# Patient Record
Sex: Female | Born: 1935 | Race: White | Hispanic: No | Smoking: Never smoker
Health system: Southern US, Community
[De-identification: ages and names within clinical notes are randomized; demographics above are authoritative.]

## PROBLEM LIST (undated history)

## (undated) DIAGNOSIS — C50919 Malignant neoplasm of unspecified site of unspecified female breast: Secondary | ICD-10-CM

## (undated) DIAGNOSIS — I10 Essential (primary) hypertension: Secondary | ICD-10-CM

## (undated) DIAGNOSIS — I499 Cardiac arrhythmia, unspecified: Secondary | ICD-10-CM

## (undated) DIAGNOSIS — F419 Anxiety disorder, unspecified: Secondary | ICD-10-CM

## (undated) DIAGNOSIS — I82409 Acute embolism and thrombosis of unspecified deep veins of unspecified lower extremity: Secondary | ICD-10-CM

## (undated) DIAGNOSIS — S8012XA Contusion of left lower leg, initial encounter: Secondary | ICD-10-CM

## (undated) DIAGNOSIS — C801 Malignant (primary) neoplasm, unspecified: Secondary | ICD-10-CM

## (undated) DIAGNOSIS — I509 Heart failure, unspecified: Secondary | ICD-10-CM

## (undated) DIAGNOSIS — K219 Gastro-esophageal reflux disease without esophagitis: Secondary | ICD-10-CM

## (undated) DIAGNOSIS — M1990 Unspecified osteoarthritis, unspecified site: Secondary | ICD-10-CM

## (undated) DIAGNOSIS — I4891 Unspecified atrial fibrillation: Secondary | ICD-10-CM

## (undated) DIAGNOSIS — Z923 Personal history of irradiation: Secondary | ICD-10-CM

## (undated) DIAGNOSIS — E119 Type 2 diabetes mellitus without complications: Secondary | ICD-10-CM

## (undated) DIAGNOSIS — I2510 Atherosclerotic heart disease of native coronary artery without angina pectoris: Secondary | ICD-10-CM

## (undated) DIAGNOSIS — I639 Cerebral infarction, unspecified: Secondary | ICD-10-CM

## (undated) DIAGNOSIS — R0600 Dyspnea, unspecified: Secondary | ICD-10-CM

## (undated) DIAGNOSIS — E785 Hyperlipidemia, unspecified: Secondary | ICD-10-CM

## (undated) HISTORY — PX: BILATERAL CARPAL TUNNEL RELEASE: SHX6508

## (undated) HISTORY — PX: TONSILLECTOMY: SUR1361

## (undated) HISTORY — PX: APPENDECTOMY: SHX54

## (undated) HISTORY — PX: CHOLECYSTECTOMY: SHX55

## (undated) HISTORY — PX: CARDIAC CATHETERIZATION: SHX172

## (undated) HISTORY — PX: EYE SURGERY: SHX253

## (undated) HISTORY — PX: HERNIA REPAIR: SHX51

---

## 1997-07-03 DIAGNOSIS — Z8673 Personal history of transient ischemic attack (TIA), and cerebral infarction without residual deficits: Secondary | ICD-10-CM | POA: Insufficient documentation

## 2001-12-03 DIAGNOSIS — I82409 Acute embolism and thrombosis of unspecified deep veins of unspecified lower extremity: Secondary | ICD-10-CM

## 2001-12-03 HISTORY — DX: Acute embolism and thrombosis of unspecified deep veins of unspecified lower extremity: I82.409

## 2005-10-04 ENCOUNTER — Ambulatory Visit: Payer: Self-pay | Admitting: Internal Medicine

## 2005-10-15 ENCOUNTER — Ambulatory Visit: Payer: Self-pay | Admitting: Internal Medicine

## 2005-11-20 ENCOUNTER — Ambulatory Visit: Payer: Self-pay | Admitting: Surgery

## 2005-12-03 DIAGNOSIS — I639 Cerebral infarction, unspecified: Secondary | ICD-10-CM

## 2005-12-03 DIAGNOSIS — C50919 Malignant neoplasm of unspecified site of unspecified female breast: Secondary | ICD-10-CM

## 2005-12-03 HISTORY — DX: Malignant neoplasm of unspecified site of unspecified female breast: C50.919

## 2005-12-03 HISTORY — DX: Cerebral infarction, unspecified: I63.9

## 2005-12-07 ENCOUNTER — Ambulatory Visit: Payer: Self-pay | Admitting: Surgery

## 2005-12-27 ENCOUNTER — Ambulatory Visit: Payer: Self-pay | Admitting: Oncology

## 2006-01-03 ENCOUNTER — Ambulatory Visit: Payer: Self-pay | Admitting: Oncology

## 2006-01-31 ENCOUNTER — Ambulatory Visit: Payer: Self-pay | Admitting: Oncology

## 2006-03-03 ENCOUNTER — Ambulatory Visit: Payer: Self-pay | Admitting: Oncology

## 2006-06-03 ENCOUNTER — Ambulatory Visit: Payer: Self-pay

## 2006-06-23 ENCOUNTER — Other Ambulatory Visit: Payer: Self-pay

## 2006-06-23 ENCOUNTER — Inpatient Hospital Stay: Payer: Self-pay | Admitting: Internal Medicine

## 2006-07-01 ENCOUNTER — Ambulatory Visit: Payer: Self-pay | Admitting: Internal Medicine

## 2006-07-08 ENCOUNTER — Ambulatory Visit: Payer: Self-pay | Admitting: Internal Medicine

## 2006-07-12 ENCOUNTER — Ambulatory Visit: Payer: Self-pay | Admitting: Oncology

## 2006-12-09 ENCOUNTER — Ambulatory Visit: Payer: Self-pay | Admitting: Oncology

## 2007-12-04 HISTORY — PX: BREAST LUMPECTOMY: SHX2

## 2007-12-15 ENCOUNTER — Ambulatory Visit: Payer: Self-pay | Admitting: Internal Medicine

## 2008-07-20 ENCOUNTER — Ambulatory Visit: Payer: Self-pay | Admitting: Internal Medicine

## 2008-07-29 ENCOUNTER — Ambulatory Visit: Payer: Self-pay | Admitting: Internal Medicine

## 2009-01-10 ENCOUNTER — Ambulatory Visit: Payer: Self-pay | Admitting: Internal Medicine

## 2009-03-14 ENCOUNTER — Ambulatory Visit: Payer: Self-pay | Admitting: Internal Medicine

## 2009-04-02 ENCOUNTER — Ambulatory Visit: Payer: Self-pay | Admitting: Internal Medicine

## 2009-05-03 ENCOUNTER — Ambulatory Visit: Payer: Self-pay | Admitting: Internal Medicine

## 2009-05-06 ENCOUNTER — Ambulatory Visit: Payer: Self-pay | Admitting: Internal Medicine

## 2009-08-10 ENCOUNTER — Ambulatory Visit: Payer: Self-pay | Admitting: Internal Medicine

## 2010-01-12 ENCOUNTER — Ambulatory Visit: Payer: Self-pay | Admitting: Internal Medicine

## 2010-08-10 ENCOUNTER — Ambulatory Visit: Payer: Self-pay | Admitting: Internal Medicine

## 2010-11-07 ENCOUNTER — Ambulatory Visit: Payer: Self-pay | Admitting: Surgery

## 2011-04-04 ENCOUNTER — Ambulatory Visit: Payer: Self-pay | Admitting: Gastroenterology

## 2011-05-22 ENCOUNTER — Ambulatory Visit: Payer: Self-pay | Admitting: Internal Medicine

## 2011-08-13 ENCOUNTER — Ambulatory Visit: Payer: Self-pay | Admitting: Internal Medicine

## 2011-12-04 DIAGNOSIS — Z923 Personal history of irradiation: Secondary | ICD-10-CM

## 2011-12-04 HISTORY — PX: BREAST LUMPECTOMY: SHX2

## 2011-12-04 HISTORY — DX: Personal history of irradiation: Z92.3

## 2012-02-18 ENCOUNTER — Ambulatory Visit: Payer: Self-pay | Admitting: Internal Medicine

## 2012-08-13 ENCOUNTER — Ambulatory Visit: Payer: Self-pay | Admitting: Internal Medicine

## 2012-08-14 ENCOUNTER — Ambulatory Visit: Payer: Self-pay | Admitting: Internal Medicine

## 2012-08-21 ENCOUNTER — Ambulatory Visit: Payer: Self-pay | Admitting: Surgery

## 2012-08-21 LAB — CBC WITH DIFFERENTIAL/PLATELET
Basophil #: 0.1 10*3/uL (ref 0.0–0.1)
Basophil %: 0.9 %
Eosinophil %: 3 %
HCT: 40.5 % (ref 35.0–47.0)
HGB: 13.8 g/dL (ref 12.0–16.0)
Lymphocyte %: 17 %
MCH: 30.7 pg (ref 26.0–34.0)
Monocyte %: 10 %
Neutrophil #: 5 10*3/uL (ref 1.4–6.5)
Neutrophil %: 69.1 %
Platelet: 213 10*3/uL (ref 150–440)
RBC: 4.49 10*6/uL (ref 3.80–5.20)

## 2012-08-21 LAB — BASIC METABOLIC PANEL
Anion Gap: 8 (ref 7–16)
Calcium, Total: 8.9 mg/dL (ref 8.5–10.1)
Co2: 26 mmol/L (ref 21–32)
Creatinine: 0.79 mg/dL (ref 0.60–1.30)
EGFR (African American): >60
EGFR (Non-African Amer.): >60
Glucose: 88 mg/dL (ref 65–99)
Osmolality: 278 (ref 275–301)

## 2012-09-01 ENCOUNTER — Ambulatory Visit: Payer: Self-pay | Admitting: Surgery

## 2012-09-01 LAB — PROTIME-INR
INR: 1
Prothrombin Time: 13.6 secs (ref 11.5–14.7)

## 2012-09-08 LAB — PATHOLOGY REPORT

## 2012-09-09 ENCOUNTER — Ambulatory Visit: Payer: Self-pay | Admitting: Oncology

## 2012-09-11 ENCOUNTER — Ambulatory Visit: Payer: Self-pay | Admitting: Oncology

## 2012-10-03 ENCOUNTER — Ambulatory Visit: Payer: Self-pay | Admitting: Oncology

## 2012-10-15 LAB — CBC CANCER CENTER
Basophil %: 0.9 %
Eosinophil #: 0.1 x10 3/mm (ref 0.0–0.7)
Eosinophil %: 1.7 %
HCT: 43 % (ref 35.0–47.0)
HGB: 13.9 g/dL (ref 12.0–16.0)
Lymphocyte #: 1 x10 3/mm (ref 1.0–3.6)
Lymphocyte %: 12.3 %
Monocyte #: 0.8 x10 3/mm (ref 0.2–0.9)
Neutrophil #: 6.1 x10 3/mm (ref 1.4–6.5)
RDW: 14.5 % (ref 11.5–14.5)
WBC: 8.1 x10 3/mm (ref 3.6–11.0)

## 2012-10-22 LAB — CBC CANCER CENTER
Basophil #: 0.1 x10 3/mm (ref 0.0–0.1)
Eosinophil #: 0.1 x10 3/mm (ref 0.0–0.7)
HCT: 43.1 % (ref 35.0–47.0)
MCH: 29.7 pg (ref 26.0–34.0)
MCHC: 31.8 g/dL — ABNORMAL LOW (ref 32.0–36.0)
Monocyte #: 0.7 x10 3/mm (ref 0.2–0.9)
Neutrophil %: 76 %
Platelet: 209 x10 3/mm (ref 150–440)
RBC: 4.62 10*6/uL (ref 3.80–5.20)
RDW: 14.8 % — ABNORMAL HIGH (ref 11.5–14.5)

## 2012-10-29 LAB — CBC CANCER CENTER
Basophil #: 0.1 x10 3/mm (ref 0.0–0.1)
Eosinophil %: 1.8 %
HCT: 40.7 % (ref 35.0–47.0)
HGB: 13.5 g/dL (ref 12.0–16.0)
Lymphocyte %: 10.2 %
Monocyte %: 10.9 %
Neutrophil #: 5.7 x10 3/mm (ref 1.4–6.5)
Platelet: 203 x10 3/mm (ref 150–440)
RDW: 15 % — ABNORMAL HIGH (ref 11.5–14.5)
WBC: 7.5 x10 3/mm (ref 3.6–11.0)

## 2012-11-02 ENCOUNTER — Ambulatory Visit: Payer: Self-pay | Admitting: Oncology

## 2012-11-05 LAB — CBC CANCER CENTER
Basophil %: 0.8 %
Eosinophil #: 0.1 x10 3/mm (ref 0.0–0.7)
HCT: 41.4 % (ref 35.0–47.0)
HGB: 13.9 g/dL (ref 12.0–16.0)
Lymphocyte %: 10.3 %
MCHC: 33.6 g/dL (ref 32.0–36.0)
Neutrophil #: 6.4 x10 3/mm (ref 1.4–6.5)
Neutrophil %: 77.3 %

## 2012-11-12 LAB — CBC CANCER CENTER
Basophil #: 0 x10 3/mm (ref 0.0–0.1)
Eosinophil #: 0.2 x10 3/mm (ref 0.0–0.7)
HCT: 41 % (ref 35.0–47.0)
Lymphocyte #: 0.9 x10 3/mm — ABNORMAL LOW (ref 1.0–3.6)
Lymphocyte %: 11.3 %
MCH: 31.1 pg (ref 26.0–34.0)
MCHC: 33.9 g/dL (ref 32.0–36.0)
MCV: 92 fL (ref 80–100)
Monocyte #: 0.9 x10 3/mm (ref 0.2–0.9)
Monocyte %: 11.2 %
Neutrophil #: 6 x10 3/mm (ref 1.4–6.5)
RBC: 4.47 10*6/uL (ref 3.80–5.20)
RDW: 15 % — ABNORMAL HIGH (ref 11.5–14.5)

## 2012-12-03 ENCOUNTER — Ambulatory Visit: Payer: Self-pay | Admitting: Oncology

## 2012-12-25 LAB — CBC CANCER CENTER
Basophil %: 0.8 %
Eosinophil #: 0.1 x10 3/mm (ref 0.0–0.7)
HGB: 13.8 g/dL (ref 12.0–16.0)
Lymphocyte #: 0.9 x10 3/mm — ABNORMAL LOW (ref 1.0–3.6)
Lymphocyte %: 12.6 %
MCH: 30.8 pg (ref 26.0–34.0)
MCV: 92 fL (ref 80–100)
Neutrophil #: 5.7 x10 3/mm (ref 1.4–6.5)
Neutrophil %: 76.3 %
RBC: 4.49 10*6/uL (ref 3.80–5.20)
RDW: 14.6 % — ABNORMAL HIGH (ref 11.5–14.5)

## 2012-12-25 LAB — COMPREHENSIVE METABOLIC PANEL
Alkaline Phosphatase: 82 U/L (ref 50–136)
Bilirubin,Total: 0.4 mg/dL (ref 0.2–1.0)
Calcium, Total: 8.7 mg/dL (ref 8.5–10.1)
Co2: 29 mmol/L (ref 21–32)
Glucose: 73 mg/dL (ref 65–99)
Osmolality: 281 (ref 275–301)
SGOT(AST): 14 U/L — ABNORMAL LOW (ref 15–37)
Sodium: 141 mmol/L (ref 136–145)
Total Protein: 7 g/dL (ref 6.4–8.2)

## 2013-01-03 ENCOUNTER — Ambulatory Visit: Payer: Self-pay | Admitting: Oncology

## 2013-06-23 ENCOUNTER — Ambulatory Visit: Payer: Self-pay | Admitting: Radiation Oncology

## 2013-07-14 ENCOUNTER — Ambulatory Visit: Payer: Self-pay | Admitting: Pain Medicine

## 2013-07-17 ENCOUNTER — Ambulatory Visit: Payer: Self-pay | Admitting: Oncology

## 2013-07-23 LAB — COMPREHENSIVE METABOLIC PANEL
Albumin: 3.6 g/dL (ref 3.4–5.0)
Alkaline Phosphatase: 66 U/L (ref 50–136)
Anion Gap: 5 — ABNORMAL LOW (ref 7–16)
BUN: 18 mg/dL (ref 7–18)
Calcium, Total: 9.1 mg/dL (ref 8.5–10.1)
Chloride: 105 mmol/L (ref 98–107)
Co2: 27 mmol/L (ref 21–32)
EGFR (African American): >60
EGFR (Non-African Amer.): 55 — ABNORMAL LOW
Glucose: 153 mg/dL — ABNORMAL HIGH (ref 65–99)
Osmolality: 279 (ref 275–301)
Potassium: 4.1 mmol/L (ref 3.5–5.1)
SGOT(AST): 17 U/L (ref 15–37)
SGPT (ALT): 19 U/L (ref 12–78)

## 2013-07-23 LAB — CBC CANCER CENTER
Basophil #: 0 x10 3/mm (ref 0.0–0.1)
Basophil %: 0.8 %
Eosinophil #: 0.1 x10 3/mm (ref 0.0–0.7)
Eosinophil %: 1.6 %
HCT: 38.4 % (ref 35.0–47.0)
HGB: 13.1 g/dL (ref 12.0–16.0)
Lymphocyte %: 15.9 %
MCHC: 34.2 g/dL (ref 32.0–36.0)
Monocyte #: 0.4 x10 3/mm (ref 0.2–0.9)
Neutrophil %: 74 %
Platelet: 183 x10 3/mm (ref 150–440)
RBC: 4.16 10*6/uL (ref 3.80–5.20)

## 2013-07-27 ENCOUNTER — Ambulatory Visit: Payer: Self-pay | Admitting: Pain Medicine

## 2013-08-03 ENCOUNTER — Ambulatory Visit: Payer: Self-pay | Admitting: Oncology

## 2013-08-25 ENCOUNTER — Ambulatory Visit: Payer: Self-pay | Admitting: Pain Medicine

## 2013-09-02 ENCOUNTER — Ambulatory Visit: Payer: Self-pay | Admitting: Pain Medicine

## 2013-09-29 ENCOUNTER — Ambulatory Visit: Payer: Self-pay | Admitting: Pain Medicine

## 2013-10-20 ENCOUNTER — Other Ambulatory Visit: Payer: Self-pay | Admitting: Pain Medicine

## 2013-10-20 LAB — CBC WITH DIFFERENTIAL/PLATELET
Basophil %: 1 %
Eosinophil #: 0.2 10*3/uL (ref 0.0–0.7)
Eosinophil %: 2.5 %
HCT: 39.6 % (ref 35.0–47.0)
HGB: 13.5 g/dL (ref 12.0–16.0)
Lymphocyte %: 11.2 %
MCH: 31.8 pg (ref 26.0–34.0)
Monocyte #: 0.8 x10 3/mm (ref 0.2–0.9)
Monocyte %: 10.5 %
Neutrophil #: 5.8 10*3/uL (ref 1.4–6.5)
Neutrophil %: 74.8 %
Platelet: 251 10*3/uL (ref 150–440)
RBC: 4.24 10*6/uL (ref 3.80–5.20)
WBC: 7.8 10*3/uL (ref 3.6–11.0)

## 2013-10-20 LAB — APTT: Activated PTT: 31.6 secs (ref 23.6–35.9)

## 2013-10-21 ENCOUNTER — Ambulatory Visit: Payer: Self-pay | Admitting: Pain Medicine

## 2013-10-27 ENCOUNTER — Ambulatory Visit: Payer: Self-pay | Admitting: Oncology

## 2013-11-19 ENCOUNTER — Ambulatory Visit: Payer: Self-pay | Admitting: Pain Medicine

## 2014-01-14 ENCOUNTER — Ambulatory Visit: Payer: Self-pay | Admitting: Specialist

## 2014-01-20 ENCOUNTER — Ambulatory Visit: Payer: Self-pay | Admitting: Oncology

## 2014-01-21 LAB — COMPREHENSIVE METABOLIC PANEL
ANION GAP: 8 (ref 7–16)
AST: 13 U/L — AB (ref 15–37)
Albumin: 3.4 g/dL (ref 3.4–5.0)
Alkaline Phosphatase: 72 U/L
BUN: 11 mg/dL (ref 7–18)
Bilirubin,Total: 0.2 mg/dL (ref 0.2–1.0)
CALCIUM: 8.7 mg/dL (ref 8.5–10.1)
CREATININE: 0.93 mg/dL (ref 0.60–1.30)
Chloride: 103 mmol/L (ref 98–107)
Co2: 29 mmol/L (ref 21–32)
EGFR (African American): >60
EGFR (Non-African Amer.): 57 — ABNORMAL LOW
Glucose: 80 mg/dL (ref 65–99)
Osmolality: 278 (ref 275–301)
Potassium: 3.9 mmol/L (ref 3.5–5.1)
SGPT (ALT): 20 U/L (ref 12–78)
SODIUM: 140 mmol/L (ref 136–145)
Total Protein: 6.7 g/dL (ref 6.4–8.2)

## 2014-01-21 LAB — CBC CANCER CENTER
Basophil #: 0.1 x10 3/mm (ref 0.0–0.1)
Basophil %: 0.9 %
EOS ABS: 0.1 x10 3/mm (ref 0.0–0.7)
Eosinophil %: 1.5 %
HCT: 41.2 % (ref 35.0–47.0)
HGB: 13.2 g/dL (ref 12.0–16.0)
Lymphocyte #: 1 x10 3/mm (ref 1.0–3.6)
Lymphocyte %: 11.8 %
MCH: 29.3 pg (ref 26.0–34.0)
MCHC: 31.9 g/dL — ABNORMAL LOW (ref 32.0–36.0)
MCV: 92 fL (ref 80–100)
MONO ABS: 0.6 x10 3/mm (ref 0.2–0.9)
Monocyte %: 7.9 %
NEUTROS ABS: 6.4 x10 3/mm (ref 1.4–6.5)
Neutrophil %: 77.9 %
PLATELETS: 270 x10 3/mm (ref 150–440)
RBC: 4.49 10*6/uL (ref 3.80–5.20)
RDW: 14.9 % — ABNORMAL HIGH (ref 11.5–14.5)
WBC: 8.2 x10 3/mm (ref 3.6–11.0)

## 2014-01-22 LAB — CANCER ANTIGEN 27.29: CA 27.29: 33 U/mL (ref 0.0–38.6)

## 2014-01-31 ENCOUNTER — Ambulatory Visit: Payer: Self-pay | Admitting: Oncology

## 2014-03-16 ENCOUNTER — Ambulatory Visit: Payer: Self-pay | Admitting: Ophthalmology

## 2014-03-16 LAB — POTASSIUM: POTASSIUM: 4.3 mmol/L (ref 3.5–5.1)

## 2014-03-30 ENCOUNTER — Ambulatory Visit: Payer: Self-pay | Admitting: Ophthalmology

## 2014-05-06 DIAGNOSIS — D649 Anemia, unspecified: Secondary | ICD-10-CM | POA: Insufficient documentation

## 2014-08-23 DIAGNOSIS — Z8709 Personal history of other diseases of the respiratory system: Secondary | ICD-10-CM | POA: Insufficient documentation

## 2014-08-23 DIAGNOSIS — G459 Transient cerebral ischemic attack, unspecified: Secondary | ICD-10-CM | POA: Insufficient documentation

## 2014-08-23 DIAGNOSIS — M19041 Primary osteoarthritis, right hand: Secondary | ICD-10-CM | POA: Insufficient documentation

## 2014-08-23 DIAGNOSIS — Z87828 Personal history of other (healed) physical injury and trauma: Secondary | ICD-10-CM | POA: Insufficient documentation

## 2014-08-23 DIAGNOSIS — M19042 Primary osteoarthritis, left hand: Secondary | ICD-10-CM

## 2014-08-23 DIAGNOSIS — R001 Bradycardia, unspecified: Secondary | ICD-10-CM | POA: Insufficient documentation

## 2014-08-23 DIAGNOSIS — I2510 Atherosclerotic heart disease of native coronary artery without angina pectoris: Secondary | ICD-10-CM | POA: Insufficient documentation

## 2014-08-23 DIAGNOSIS — Z86718 Personal history of other venous thrombosis and embolism: Secondary | ICD-10-CM | POA: Insufficient documentation

## 2014-08-23 DIAGNOSIS — R002 Palpitations: Secondary | ICD-10-CM | POA: Insufficient documentation

## 2014-08-23 DIAGNOSIS — E669 Obesity, unspecified: Secondary | ICD-10-CM | POA: Insufficient documentation

## 2014-08-30 ENCOUNTER — Ambulatory Visit: Payer: Self-pay | Admitting: Oncology

## 2014-08-30 LAB — COMPREHENSIVE METABOLIC PANEL
ALBUMIN: 3.5 g/dL (ref 3.4–5.0)
AST: 13 U/L — AB (ref 15–37)
Alkaline Phosphatase: 68 U/L
Anion Gap: 6 — ABNORMAL LOW (ref 7–16)
BUN: 15 mg/dL (ref 7–18)
Bilirubin,Total: 0.4 mg/dL (ref 0.2–1.0)
CALCIUM: 9.1 mg/dL (ref 8.5–10.1)
CO2: 27 mmol/L (ref 21–32)
Chloride: 101 mmol/L (ref 98–107)
Creatinine: 1 mg/dL (ref 0.60–1.30)
EGFR (Non-African Amer.): 56 — ABNORMAL LOW
Glucose: 136 mg/dL — ABNORMAL HIGH (ref 65–99)
Osmolality: 271 (ref 275–301)
POTASSIUM: 4.4 mmol/L (ref 3.5–5.1)
SGPT (ALT): 22 U/L
SODIUM: 134 mmol/L — AB (ref 136–145)
Total Protein: 6.2 g/dL — ABNORMAL LOW (ref 6.4–8.2)

## 2014-08-30 LAB — CBC CANCER CENTER
Basophil #: 0 x10 3/mm (ref 0.0–0.1)
Basophil %: 0.6 %
EOS ABS: 0.1 x10 3/mm (ref 0.0–0.7)
Eosinophil %: 1.1 %
HCT: 41.1 % (ref 35.0–47.0)
HGB: 13.1 g/dL (ref 12.0–16.0)
Lymphocyte #: 0.9 x10 3/mm — ABNORMAL LOW (ref 1.0–3.6)
Lymphocyte %: 11.4 %
MCH: 30.3 pg (ref 26.0–34.0)
MCHC: 32 g/dL (ref 32.0–36.0)
MCV: 95 fL (ref 80–100)
MONOS PCT: 7.5 %
Monocyte #: 0.6 x10 3/mm (ref 0.2–0.9)
NEUTROS PCT: 79.4 %
Neutrophil #: 6.3 x10 3/mm (ref 1.4–6.5)
PLATELETS: 203 x10 3/mm (ref 150–440)
RBC: 4.34 10*6/uL (ref 3.80–5.20)
RDW: 15 % — AB (ref 11.5–14.5)
WBC: 7.9 x10 3/mm (ref 3.6–11.0)

## 2014-09-02 ENCOUNTER — Ambulatory Visit: Payer: Self-pay | Admitting: Oncology

## 2014-09-13 IMAGING — US US NEEDLE LOCALIZATION*R*
1 series · 7 of 7 positions shown · non-contrast
Comparison: none

REASON FOR EXAM: R brst exc mass USG NL dug 6685am  mammo after  [DATE]
COMMENTS:

[Series 1: us needle localization*right* · 0.08mm/px · 7 of 7 slices shown]
[im 1/7]
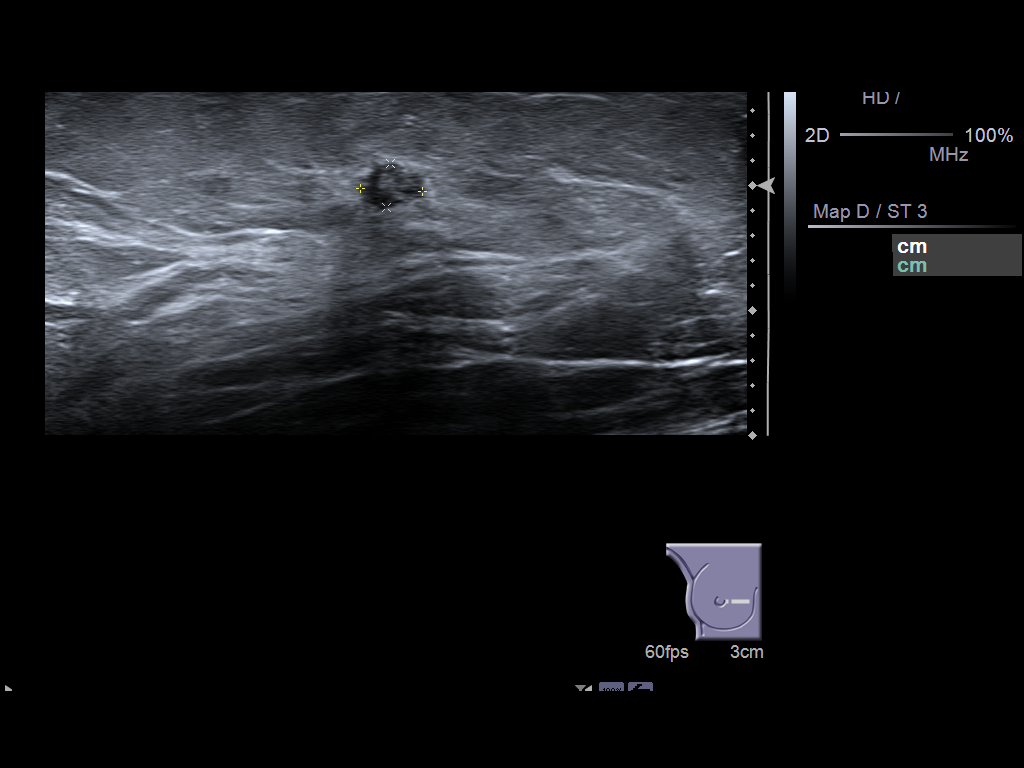
[im 2/7]
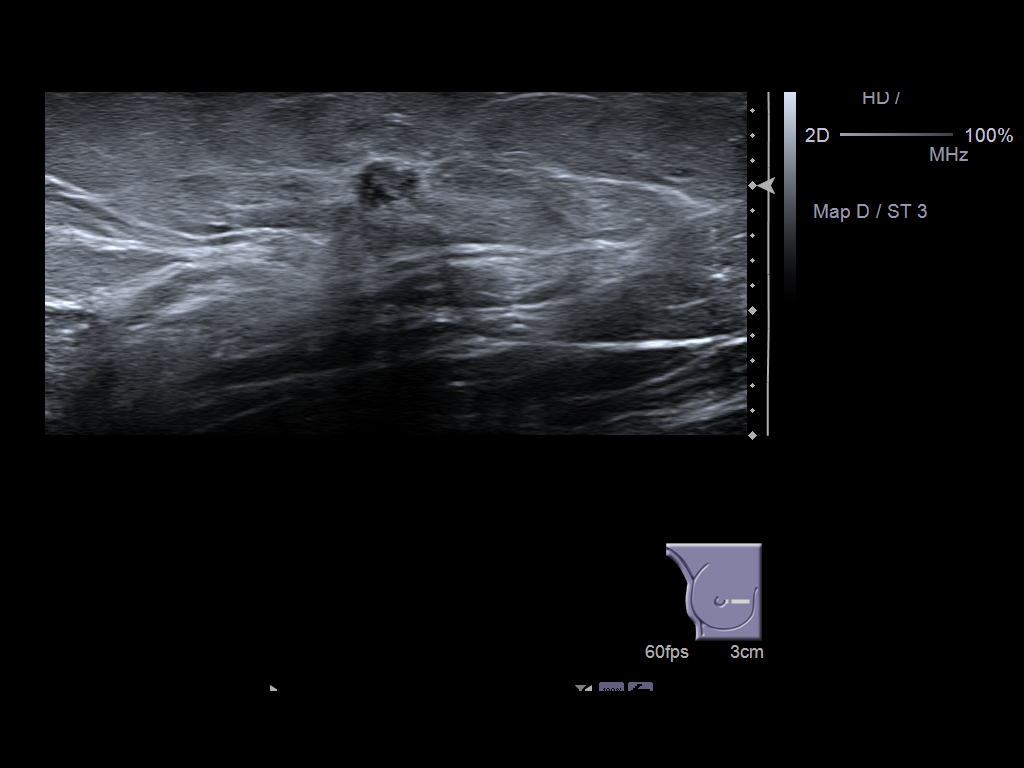
[im 3/7]
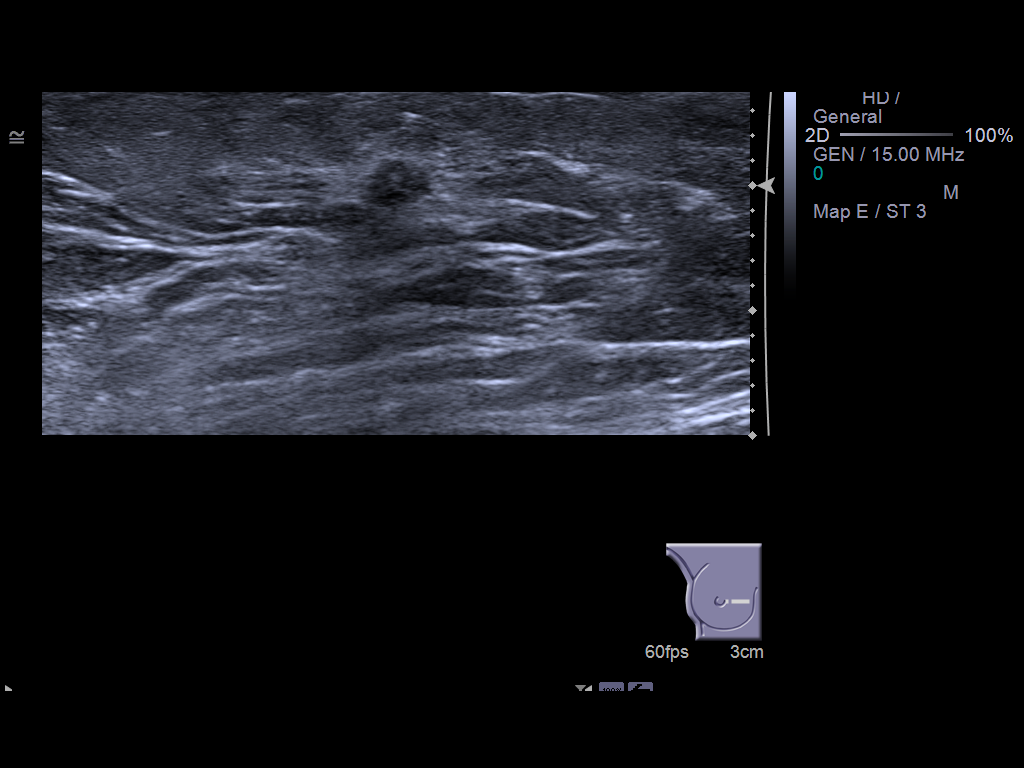
[im 4/7]
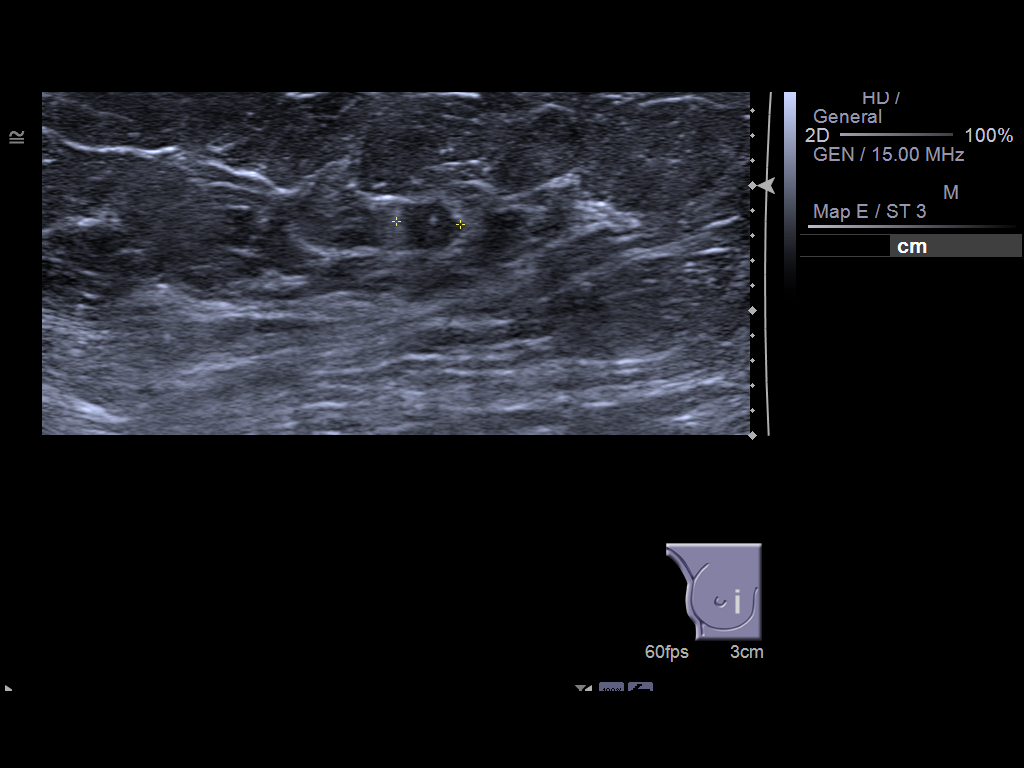
[im 5/7]
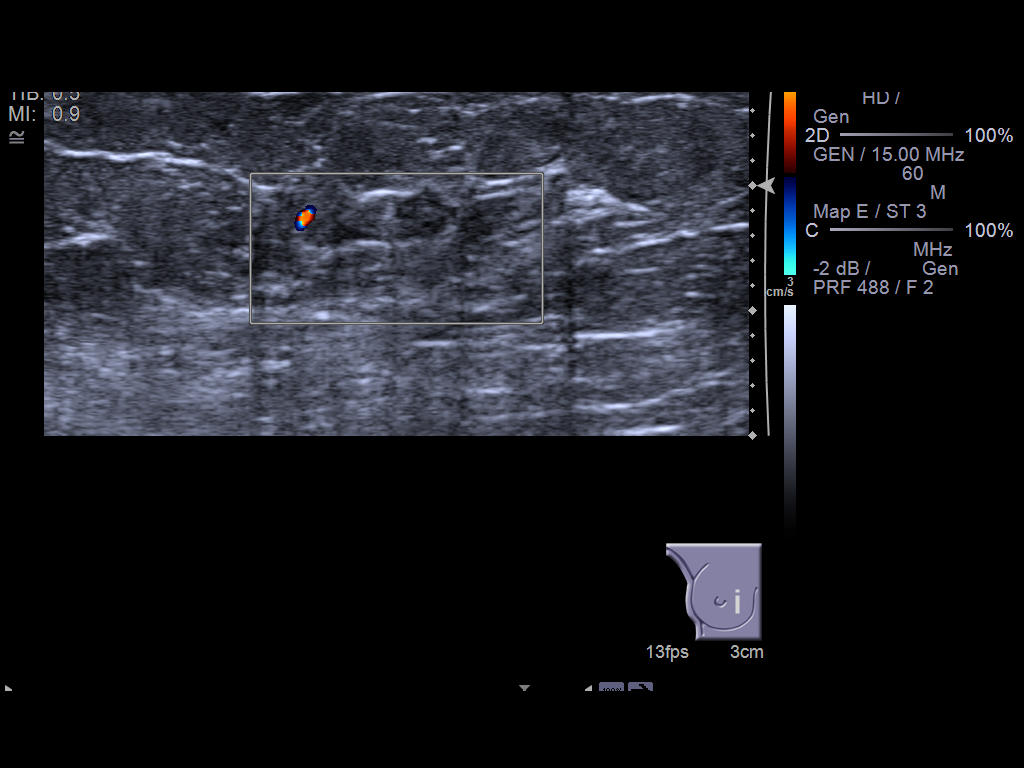
[im 6/7]
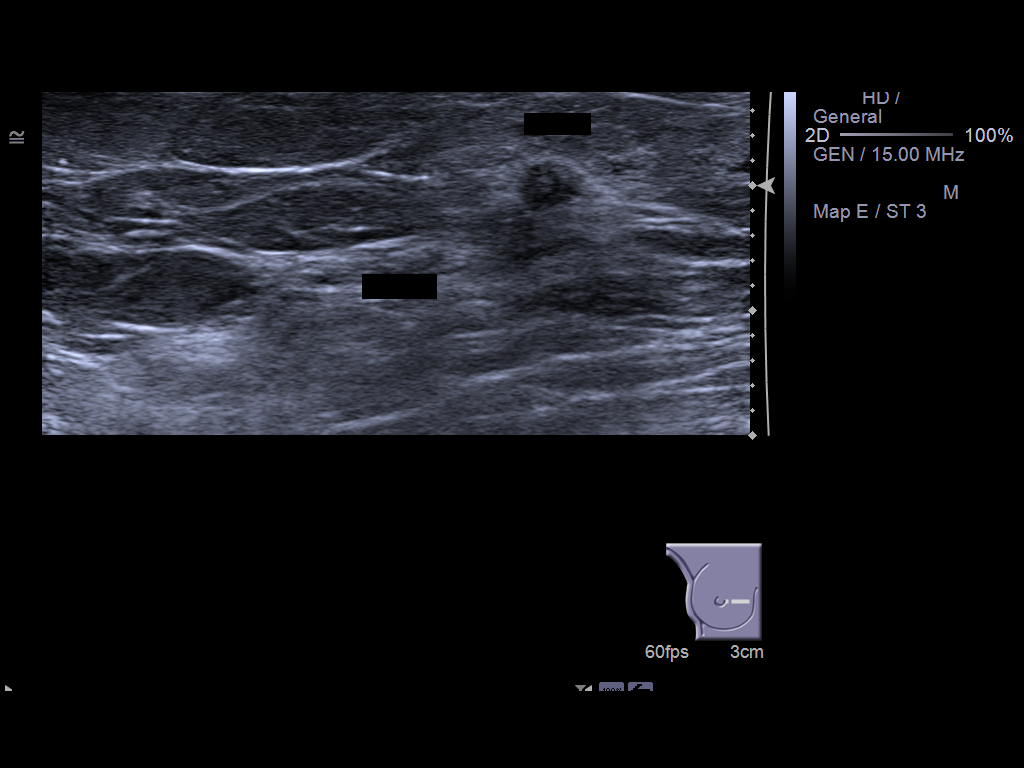
[im 7/7]
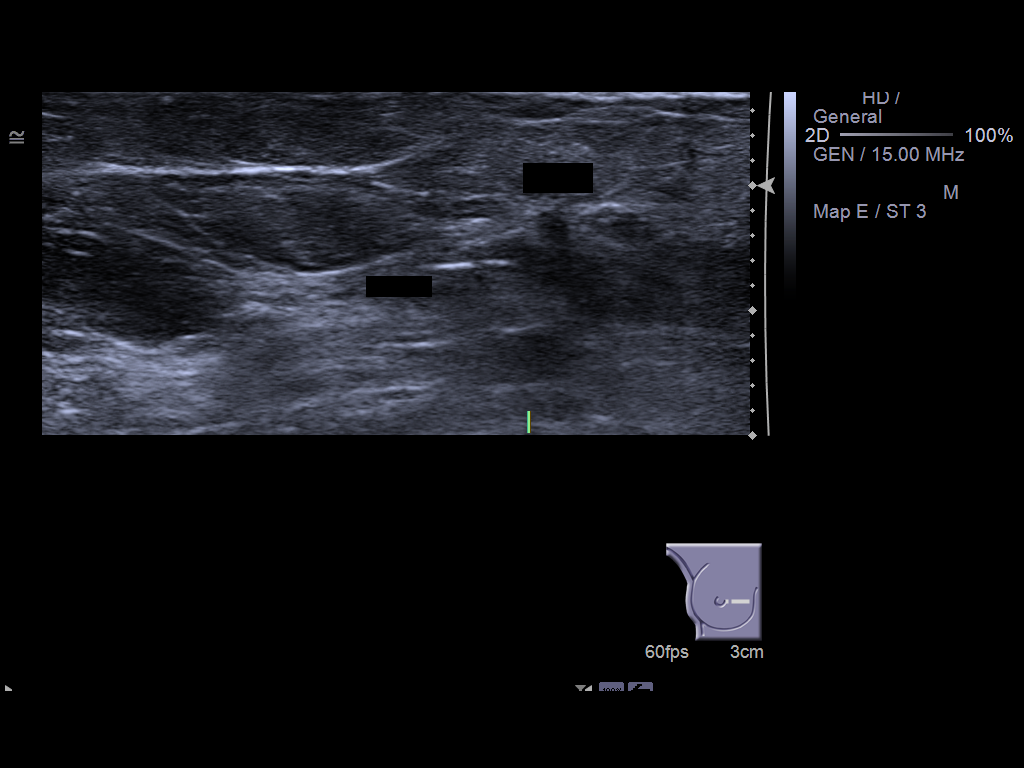

[7 of 7 positions shown; findings below may reference images not displayed]

PROCEDURE:     US  - US GUIDED NEEDLE LOCAL R BREAST  - September 01, 2012  [DATE]

RESULT:     After discussing the risk and benefits of this procedure with
the patient consent was obtained. Right breast was sterilely prepped and
draped. Following local anesthesia with 1% lidocaine Kopans hook wire needle
system was advanced into the lesion and deployed with no complications.
IMPRESSION: Successful needle localization.

## 2014-11-01 ENCOUNTER — Ambulatory Visit: Payer: Self-pay | Admitting: Oncology

## 2015-03-22 NOTE — Consult Note (Signed)
Reason for Visit: This 81 year old Female patient presents to the clinic for initial evaluation of  Breast cancer .   Referred by Dr. Tamala Julian.  Diagnosis:   Chief Complaint/Diagnosis   59-year-old female status post needle localization for a 6 mm T1 A. 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 Recently has presented with a abnormal mammogram of the right breast confirmed on ultrasound. Lesion was approxi-6 mm in size. She underwent a needle localization which was positive for 6 mm invasive mammary carcinoma strongly ER positive weakly PR positive and HER-2/neu not overexpressed. Sentinel lymph node was not performed. Wide local excision for clear margins was not performed. Patient is reluctant for any further surgery at her age and overall Gen. condition. She's been evaluated by medical oncology and is now referred to radiation oncology for opinion. She is doing fairly well although his quite elderly and confirmed.  Past Hx:    Osteoporosis:    Breast Cancer:    obesity:    Environmental allergies:    Osteoarthritis:    Hyperlipidemia:    HX: Atrial Fibrillation:    HX: DVT:    Breast cancer:    HX; Asthma:    Back Pain, Chronic:    Diabetes Mellitus, Type II (NIDD):    Hypercholesterolemia:    cva:    htn:    Tonsillectomy:    Cataract Extraction:    Cholecystectomy:    Appendectomy:    Breast surgery:   Past, Family and Social History:   Past Medical History  positive    Cardiovascular atrial fibrillation; hyperlipidemia; hypertension    Respiratory asthma    Endocrine diabetes mellitus    Neurological/Psychiatric CVA    Past Surgical History appendectomy; cholecystectomy; Tonsillectomy, cataract excision    Past Medical History Comments Chronic back pain, history of DVT    Family History noncontributory    Social History noncontributory    Additional Past Medical and Surgical History Seen accompanied by nurse navigator   Allergies:   No Known Allergies:   Home Meds:  Home Medications: Medication Instructions Status  magnesium oxide tablet 400 mg 1 tab(s) orally once a day (in the morning) Active  lovastatin tablet 40 mg 1 tab(s) orally once a day (at bedtime) Active  Cartia XT 180 mg/24 hours oral capsule, extended release 1 cap(s) orally once a day (in the morning) Active  glimepiride 4 mg oral tablet 1 tab(s) orally once a day (in the morning) Active  multivitamin 1 tab(s) orally once a day (in the morning) Active  omeprazole 20 mg oral delayed release capsule 1 cap(s) orally once a day (in the morning) Active  losartan 50 mg oral tablet 1 tab(s) orally once a day midday Active  potassium chloride 10 mEq oral tablet, extended release 1 tab(s) orally once a day midday Active  Pradaxa 150 mg oral capsule 1 cap(s) orally 2 times a day. Will stop 08/29/12. Active  alprazolam tablet 0.25 mg 1 tab(s) orally once a day (at bedtime) Active  furosemide  40 mg oral tablet 1 tab(s) orally once a day Active   Review of Systems:   General negative    Performance Status (ECOG) 1    Skin negative    Breast see HPI    Ophthalmologic negative    ENMT negative    Respiratory and Thorax negative    Cardiovascular negative    Gastrointestinal negative    Genitourinary negative    Musculoskeletal negative    Neurological negative    Psychiatric negative    Hematology/Lymphatics negative    Endocrine negative     Allergic/Immunologic negative    Review of Systems   Patient denies any weight loss, fatigue, weakness, fever, chills or night sweats. Patient denies any loss of vision, blurred vision. Patient denies any ringing  of the ears or hearing loss. No irregular heartbeat. Patient denies heart murmur or history of fainting. Patient denies any chest pain or pain radiating to her upper extremities. Patient denies any shortness of breath, difficulty breathing at night, cough or hemoptysis. Patient denies any swelling in the lower legs. Patient denies any nausea vomiting, vomiting of blood, or coffee ground material in the vomitus. Patient denies any stomach pain. Patient states has had normal bowel movements no significant constipation or diarrhea. Patient denies any dysuria, hematuria or significant nocturia. Patient denies any problems walking, swelling in the joints or loss of balance. Patient denies any skin changes, loss of hair or loss of weight. Patient denies any excessive worrying or anxiety or significant depression. Patient denies any problems with insomnia. Patient denies excessive thirst, polyuria, polydipsia. Patient denies any swollen glands, patient denies easy bruising or easy bleeding. Patient denies any recent infections, allergies or URI. Patient "s visual fields have not changed significantly in recent time.  Nursing Notes:  Nursing Vital Signs and Chemo Nursing Nursing Notes: *CC Vital Signs Flowsheet:   10-Oct-13 10:44   Temp Temperature 96.9   Pulse Pulse 97   Respirations Respirations 18   SBP SBP 168   DBP DBP 80   Pain Scale (0-10)  0   Current Weight (kg) (kg) 69.8   Height (cm) centimeters 150   BSA (m2) 1.6   Physical Exam:  General/Skin/HEENT:   General normal    Skin normal    Eyes normal    ENMT normal    Head and Neck normal    Additional PE Well-developed slightly obese female in NAD. Her left breast is status post prior surgery and adjuvant radiation therapy  with some fibrotic changes noted. Right breast wide local excision site is healing well. No dominant mass or nodularity is noted in either breast into position examined. No axillary or supraclavicular adenopathy is identified.   Breasts/Resp/CV/GI/GU:   Respiratory and Thorax normal    Cardiovascular normal    Gastrointestinal normal    Genitourinary normal   MS/Neuro/Psych/Lymph:   Musculoskeletal normal    Neurological normal    Lymphatics normal   Assessment and Plan:  Impression:   clinical stage I invasive mammary carcinoma the right breast in 81 year old female status post needle localization for ER positive PR borderline HER-2/neu negative invasive mammary carcinoma  Plan:   at this time I got over treatment options with the patient and discussed the case personally with Dr. Oliva Bustard. Based on her age and her overall desire not to have further surgery would be amenable to go ahead with accelerated partial breast irradiation based on the small clinical size of her tumor and low likelihood of any axillary  disease. Would contact Dr. Thompson Caul office for possibility of MammoSite catheter placement. Patient is okay with proceeding with that treatment plan. If MammoSite catheter could not be placed because of location of her cavity would go ahead with accelerated field of whole breast radiation. Risks and benefits of treatment were explained in detail to the patient. Depending on accelerated partial breast irradiation plan or whole breast plan side effects may change and will be explained at the time of consent signing. We'll contact Dr. Thompson Caul office. I've perspiration discussed the case with Dr. Oliva Bustard who is in agreement with her treatment plan.  I would like to take this opportunity to thank you for allowing me to continue to participate in this patient's care.  CC Referral:   cc: Dr. Tamala Julian, Dr. Fulton Reek   Electronic Signatures: Baruch Gouty, Roda Shutters (MD)  (Signed 24-Oct-13  15:01)  Authored: HPI, Diagnosis, Past Hx, PFSH, Allergies, Home Meds, ROS, Nursing Notes, Physical Exam, Encounter Assessment and Plan, CC Referring Physician   Last Updated: 24-Oct-13 15:01 by Armstead Peaks (MD)

## 2015-03-22 NOTE — Op Note (Signed)
PATIENT NAME:  Rachael Horne, Rachael Horne MR#:  [id number] DATE OF BIRTH:  09-06-1928  DATE OF PROCEDURE:  09/01/2012  PREOPERATIVE DIAGNOSIS: Right breast mass.   POSTOPERATIVE DIAGNOSIS: Right breast mass.   PROCEDURE: Excision of right breast mass.   SURGEON: Rochel Brome, M.D.   ANESTHESIA: General.   INDICATIONS: This 81 year old female with prior history of cancer of the left breast recently  had a mammogram depicting a small nodule in the medial aspect of the right breast. Ultrasound demonstrated a 6-mm shadowing, rounded nodule at the three o'clock position. She had preoperative ultrasound-guided insertion of a Kopans wire and follow-up mammogram. The nodule was best demonstrated with ultrasound.   DESCRIPTION OF THE PROCEDURE: The patient was placed on the operating table in the supine position under general anesthesia. The dressing was removed from the medial aspect of the right breast exposing the Kopans wire, which entered the breast at the peripheral medial margin of the breast. Ultrasound was used to demonstrate the location of the nodule which was at the three o'clock position and was approximately 3 cm from the entrance point of the wire. Next, a site was prepared with ChloraPrep and draped in a sterile manner.   An oblique incision was made over the wire, carried down through subcutaneous tissues and began to palpate small nodule. Dissection was carried out to remove some normal tissue with the nodule and completely excise the nodule. This was submitted with the wire for routine pathology. The wound was inspected. Several small bleeding points were cauterized. Hemostasis was subsequently intact. Subcutaneous tissues were closed with 4-0 chromic. The skin was closed with running 5-0 Monocryl subcuticular suture and Dermabond.      The patient tolerated surgery satisfactorily and was then prepared for transfer to the recovery room.     ____________________________ Lenna Sciara. Rochel Brome,  MD jws:bjt D: 09/01/2012 12:33:23 ET T: 09/01/2012 12:43:09 ET JOB#: [id number]  cc: Loreli Dollar, MD, <Dictator> Loreli Dollar MD ELECTRONICALLY SIGNED 09/07/2012 19:29

## 2015-03-26 NOTE — Op Note (Signed)
PATIENT NAME:  Rachael Horne, Rachael Horne MR#:  [id number] DATE OF BIRTH:  July 02, 1928  DATE OF PROCEDURE:  03/30/2014  PREOPERATIVE DIAGNOSIS: Visually significant cataract of the left eye.   POSTOPERATIVE DIAGNOSIS: Visually significant cataract of the left eye.   OPERATIVE PROCEDURE: Cataract extraction by phacoemulsification with implant of intraocular lens to left eye.   SURGEON: Birder Robson, MD.   ANESTHESIA:  1.  Managed anesthesia care.  2.  Topical tetracaine drops followed by 2% Xylocaine jelly applied in the preoperative holding area.   COMPLICATIONS: None.   TECHNIQUE:  Stop and chop.  DESCRIPTION OF PROCEDURE: The patient was examined and consented in the preoperative holding area where the aforementioned topical anesthesia was applied to the left eye and then brought back to the Operating Room where the left eye was prepped and draped in the usual sterile ophthalmic fashion and a lid speculum was placed. A paracentesis was created with the side port blade and the anterior chamber was filled with viscoelastic. A near clear corneal incision was performed with the steel keratome. A continuous curvilinear capsulorrhexis was performed with a cystotome followed by the capsulorrhexis forceps. Hydrodissection and hydrodelineation were carried out with BSS on a blunt cannula. The lens was removed in a stop and chop  technique and the remaining cortical material was removed with the irrigation-aspiration handpiece. The capsular bag was inflated with viscoelastic and the Tecnis ZCB00 23.5-diopter lens, serial number [id number] was placed in the capsular bag without complication. The remaining viscoelastic was removed from the eye with the irrigation-aspiration handpiece. The wounds were hydrated. The anterior chamber was flushed with Miostat and the eye was inflated to physiologic pressure. 0.1 mL of cefuroxime concentration 10 mg/mL was placed in the anterior chamber. The wounds were found to be  water tight. The eye was dressed with Vigamox. The patient was given protective glasses to wear throughout the day and a shield with which to sleep tonight. The patient was also given drops with which to begin a drop regimen today and will follow-up with me in one day.      ____________________________ Livingston Diones. Letta Cargile, MD wlp:dmm D: 03/30/2014 21:25:44 ET T: 03/30/2014 22:05:33 ET JOB#: [id number]  cc: Nayleah Gamel L. Kynzleigh Bandel, MD, <Dictator> Livingston Diones Jacayla Nordell MD ELECTRONICALLY SIGNED 03/31/2014 14:07

## 2015-07-13 ENCOUNTER — Other Ambulatory Visit: Payer: Self-pay | Admitting: Internal Medicine

## 2015-07-13 ENCOUNTER — Ambulatory Visit
Admission: RE | Admit: 2015-07-13 | Discharge: 2015-07-13 | Disposition: A | Discharge status: Home / Self Care | Payer: Medicare Other | Source: Ambulatory Visit | Attending: Internal Medicine | Admitting: Internal Medicine

## 2015-07-13 DIAGNOSIS — R600 Localized edema: Secondary | ICD-10-CM | POA: Insufficient documentation

## 2015-07-13 DIAGNOSIS — M79605 Pain in left leg: Secondary | ICD-10-CM | POA: Diagnosis not present

## 2015-08-05 ENCOUNTER — Emergency Department: Payer: Medicare Other

## 2015-08-05 ENCOUNTER — Emergency Department
Admission: EM | Admit: 2015-08-05 | Discharge: 2015-08-05 | Disposition: A | Discharge status: Home / Self Care | Payer: Medicare Other | Attending: Emergency Medicine | Admitting: Emergency Medicine

## 2015-08-05 DIAGNOSIS — I2510 Atherosclerotic heart disease of native coronary artery without angina pectoris: Secondary | ICD-10-CM | POA: Insufficient documentation

## 2015-08-05 DIAGNOSIS — Z7951 Long term (current) use of inhaled steroids: Secondary | ICD-10-CM | POA: Insufficient documentation

## 2015-08-05 DIAGNOSIS — Z79899 Other long term (current) drug therapy: Secondary | ICD-10-CM | POA: Insufficient documentation

## 2015-08-05 DIAGNOSIS — R0789 Other chest pain: Secondary | ICD-10-CM | POA: Insufficient documentation

## 2015-08-05 DIAGNOSIS — R079 Chest pain, unspecified: Secondary | ICD-10-CM | POA: Diagnosis present

## 2015-08-05 DIAGNOSIS — I10 Essential (primary) hypertension: Secondary | ICD-10-CM | POA: Diagnosis not present

## 2015-08-05 DIAGNOSIS — E119 Type 2 diabetes mellitus without complications: Secondary | ICD-10-CM | POA: Insufficient documentation

## 2015-08-05 DIAGNOSIS — I4891 Unspecified atrial fibrillation: Secondary | ICD-10-CM | POA: Insufficient documentation

## 2015-08-05 HISTORY — DX: Acute embolism and thrombosis of unspecified deep veins of unspecified lower extremity: I82.409

## 2015-08-05 HISTORY — DX: Atherosclerotic heart disease of native coronary artery without angina pectoris: I25.10

## 2015-08-05 HISTORY — DX: Malignant (primary) neoplasm, unspecified: C80.1

## 2015-08-05 HISTORY — DX: Essential (primary) hypertension: I10

## 2015-08-05 HISTORY — DX: Type 2 diabetes mellitus without complications: E11.9

## 2015-08-05 HISTORY — DX: Cerebral infarction, unspecified: I63.9

## 2015-08-05 HISTORY — DX: Hyperlipidemia, unspecified: E78.5

## 2015-08-05 LAB — BASIC METABOLIC PANEL
ANION GAP: 7 (ref 5–15)
BUN: 15 mg/dL (ref 6–20)
CALCIUM: 9.5 mg/dL (ref 8.9–10.3)
CO2: 29 mmol/L (ref 22–32)
Chloride: 102 mmol/L (ref 101–111)
Creatinine, Ser: 0.99 mg/dL (ref 0.44–1.00)
GFR, EST AFRICAN AMERICAN: 59 mL/min — AB (ref >60)
GFR, EST NON AFRICAN AMERICAN: 51 mL/min — AB (ref >60)
GLUCOSE: 111 mg/dL — AB (ref 65–99)
Potassium: 3.9 mmol/L (ref 3.5–5.1)
Sodium: 138 mmol/L (ref 135–145)

## 2015-08-05 LAB — CBC
HCT: 42.9 % (ref 35.0–47.0)
HEMOGLOBIN: 14.2 g/dL (ref 12.0–16.0)
MCH: 31.4 pg (ref 26.0–34.0)
MCHC: 33 g/dL (ref 32.0–36.0)
MCV: 95.1 fL (ref 80.0–100.0)
Platelets: 206 10*3/uL (ref 150–440)
RBC: 4.51 MIL/uL (ref 3.80–5.20)
RDW: 14.7 % — AB (ref 11.5–14.5)
WBC: 8.3 10*3/uL (ref 3.6–11.0)

## 2015-08-05 LAB — TROPONIN I

## 2015-08-05 NOTE — ED Notes (Signed)
Pt c/o chest discomfort since Monday with generalized fatigue .Marland Kitchenstates she was referred by cardiologist

## 2015-08-05 NOTE — ED Notes (Signed)
Patient has been having chest discomfort for last few days. States she called her PCP and cardiology and both were out of town. She decided to come here to be checked out.

## 2015-08-05 NOTE — Discharge Instructions (Signed)

## 2015-08-05 NOTE — ED Provider Notes (Signed)
[facility] Emergency Department Provider Note  ____________________________________________  Time seen: Approximately 7:37 PM  I have reviewed the triage vital signs and the nursing notes.   HISTORY  Chief Complaint Chest Pain and Fatigue    HPI Rachael Horne is a 81 y.o. female with multiple chronic medical conditions including chronic a-fib, CAD, and uncomplicated diabetes who presents with several days of general malaise and occasional SOB accompanied by mild chest tightness.  Nothing makes it better or worse and the symptoms are brief in duration (minutes).  She has felt similar symptoms in the past.  She tried calling her PCP and cardiologist, but both are out of town, so she came to the ED for evaluation.  She denies any recent URI symptoms, fever/chills, abdominal pain, dysuria, N/V/D.   Past Medical History  Diagnosis Date  . Diabetes mellitus without complication   . Hypertension   . Hyperlipemia   . Coronary artery disease   . Stroke   . DVT (deep venous thrombosis)   . Cancer     BL breast    There are no active problems to display for this patient.   Past Surgical History  Procedure Laterality Date  . Appendectomy    . Tonsillectomy    . Cholecystectomy    . Breast lumpectomy      removal of CA    Current Outpatient Rx  Name  Route  Sig  Dispense  Refill  . acetaminophen (TYLENOL) 500 MG tablet   Oral   Take 1,000 mg by mouth 2 (two) times daily as needed for mild pain.         Marland Kitchen albuterol (PROVENTIL HFA;VENTOLIN HFA) 108 (90 BASE) MCG/ACT inhaler   Inhalation   Inhale 2 puffs into the lungs every 6 (six) hours as needed for wheezing or shortness of breath.         . ALPRAZolam (XANAX) 0.25 MG tablet   Oral   Take 0.125 mg by mouth at bedtime as needed for anxiety or sleep.         Marland Kitchen anastrozole (ARIMIDEX) 1 MG tablet   Oral   Take 1 mg by mouth daily.         . budesonide-formoterol (SYMBICORT) 160-4.5  MCG/ACT inhaler   Inhalation   Inhale 2 puffs into the lungs 2 (two) times daily.         Marland Kitchen CALCIUM-VITAMIN D-VITAMIN K PO   Oral   Take 1 tablet by mouth 2 (two) times daily.         . dabigatran (PRADAXA) 150 MG CAPS capsule   Oral   Take 150 mg by mouth 2 (two) times daily.         Marland Kitchen diltiazem (DILACOR XR) 240 MG 24 hr capsule   Oral   Take 240 mg by mouth daily.         . diphenhydramine-acetaminophen (TYLENOL PM) 25-500 MG TABS   Oral   Take 1 tablet by mouth at bedtime as needed (for sleep).         . furosemide (LASIX) 40 MG tablet   Oral   Take 40 mg by mouth daily at 12 noon.         Marland Kitchen glimepiride (AMARYL) 4 MG tablet   Oral   Take 4 mg by mouth daily.         Marland Kitchen losartan (COZAAR) 50 MG tablet   Oral   Take 50 mg by mouth daily.         Marland Kitchen  magnesium oxide (MAG-OX) 400 MG tablet   Oral   Take 400 mg by mouth daily at 12 noon.         . Multiple Vitamin (MULTIVITAMIN WITH MINERALS) TABS tablet   Oral   Take 1 tablet by mouth daily at 12 noon.         . pantoprazole (PROTONIX) 40 MG tablet   Oral   Take 40 mg by mouth daily.         Vladimir Faster Glycol-Propyl Glycol (SYSTANE OP)   Ophthalmic   Apply 1 drop to eye 4 (four) times daily as needed (for dry eyes).         . potassium chloride (K-DUR) 10 MEQ tablet   Oral   Take 10 mEq by mouth daily at 12 noon.           Allergies Review of patient's allergies indicates no known allergies.  No family history on file.  Social History Social History  Substance Use Topics  . Smoking status: Never Smoker   . Smokeless tobacco: Never Used  . Alcohol Use: No    Review of Systems Constitutional: No fever/chills Eyes: No visual changes. ENT: No sore throat. Cardiovascular: Mild chest discomfort, currently resolved Respiratory: intermittent shortness of breath that seems to be related to her chest discomfort, worse with exertion Gastrointestinal: No abdominal pain.  No nausea, no  vomiting.  No diarrhea.  No constipation. Genitourinary: Negative for dysuria. Musculoskeletal: Negative for back pain. Skin: Negative for rash. Neurological: Negative for headaches, focal weakness or numbness.  10-point ROS otherwise negative.  ____________________________________________   PHYSICAL EXAM:  VITAL SIGNS: ED Triage Vitals  Enc Vitals Group     BP 08/05/15 1750 192/66 mmHg     Pulse Rate 08/05/15 1800 72     Resp 08/05/15 1750 20     Temp --      Temp src --      SpO2 08/05/15 1800 98 %     Weight 08/05/15 1646 150 lb (68.04 kg)     Height 08/05/15 1646 4\' 11"  (1.499 m)     Head Cir --      Peak Flow --      Pain Score 08/05/15 1646 0     Pain Loc --      Pain Edu? --      Excl. in [place]? --     Constitutional: Alert and oriented. Elderly female. Well appearing and in no acute distress. Eyes: Conjunctivae are normal. PERRL. EOMI. Head: Atraumatic. Nose: No congestion/rhinnorhea. Mouth/Throat: Mucous membranes are moist.  Oropharynx non-erythematous. Neck: No stridor.   Cardiovascular: Normal rate, regular rhythm. Grossly normal heart sounds.  Good peripheral circulation. Respiratory: Normal respiratory effort.  No retractions. Lungs CTAB. Gastrointestinal: Soft and nontender. No distention. No abdominal bruits. No CVA tenderness. Musculoskeletal: No lower extremity tenderness nor edema.  No joint effusions. Neurologic:  Normal speech and language. No gross focal neurologic deficits are appreciated.  Skin:  Skin is warm, dry and intact. No rash noted. Psychiatric: Mood and affect are normal. Speech and behavior are normal.  ____________________________________________   LABS (all labs ordered are listed, but only abnormal results are displayed)  Labs Reviewed  BASIC METABOLIC PANEL - Abnormal; Notable for the following:    Glucose, Bld 111 (*)    GFR calc non Af Amer 51 (*)    GFR calc Af Amer 59 (*)    All other components within normal limits  CBC  - Abnormal; Notable  for the following:    RDW 14.7 (*)    All other components within normal limits  TROPONIN I   ____________________________________________  EKG  ED ECG REPORT I, Domonik Levario, the attending physician, personally viewed and interpreted this ECG.   Date: 08/05/2015  EKG Time: 16:40  Rate: 79  Rhythm: atrial fibrillation, rate 79  Axis: Normal  Intervals:Abnormal due to atrial fibrillation  ST&T Change: Non-specific ST segment / T-wave changes, but no evidence of acute ischemia.   ____________________________________________  RADIOLOGY   Dg Chest 2 View  08/05/2015   CLINICAL DATA:  Shortness of breath. Weakness. Duration of symptoms: 1 week.  EXAM: CHEST  2 VIEW  COMPARISON:  01/14/2014  FINDINGS: Moderate enlargement of the cardiopericardial silhouette. Atherosclerotic aortic arch.  Bony demineralization.  The lungs appear clear.  No pleural effusion.  Mild thoracic spondylosis.  IMPRESSION: 1. Moderate cardiomegaly, without edema. 2. Bony demineralization. 3. Mild thoracic spondylosis. 4. Aortic atherosclerosis.   Electronically Signed   By: Van Clines M.D.   On: 08/05/2015 17:48    ____________________________________________   PROCEDURES  Procedure(s) performed: None  Critical Care performed: No ____________________________________________   INITIAL IMPRESSION / ASSESSMENT AND PLAN / ED COURSE  Pertinent labs & imaging results that were available during my care of the patient were reviewed by me and considered in my medical decision making (see chart for details).  I discussed the results of her workup with the patient, and she was reassured. I think that some of her concern related to the fact that both her primary care doctor and her cardiologist are currently unavailable. I explained that this workup is reassuring but offered additional workup such as a second troponin, but I also explained why did not think this was necessary given the  duration of her symptoms and the reassuring results that she has currently. She agrees and says that she is ready to go home. I believe that she will follow closely with her outpatient doctors early next week. I gave her my usual and customary return precautions. She was asymptomatic and feeling well at the time of her discharge.  ____________________________________________  FINAL CLINICAL IMPRESSION(S) / ED DIAGNOSES  Final diagnoses:  Chest discomfort      NEW MEDICATIONS STARTED DURING THIS VISIT:  New Prescriptions   No medications on file     Hinda Kehr, MD 08/06/15 0110

## 2015-08-19 ENCOUNTER — Other Ambulatory Visit: Payer: Self-pay | Admitting: *Deleted

## 2015-08-19 DIAGNOSIS — C50919 Malignant neoplasm of unspecified site of unspecified female breast: Secondary | ICD-10-CM

## 2015-08-22 ENCOUNTER — Inpatient Hospital Stay: Payer: Medicare Other

## 2015-08-22 ENCOUNTER — Encounter: Payer: Self-pay | Admitting: Oncology

## 2015-08-22 ENCOUNTER — Inpatient Hospital Stay: Payer: Medicare Other | Attending: Oncology | Admitting: Oncology

## 2015-08-22 VITALS — BP 138/62 | HR 86 | Temp 97.3°F | Wt 152.1 lb

## 2015-08-22 DIAGNOSIS — R531 Weakness: Secondary | ICD-10-CM

## 2015-08-22 DIAGNOSIS — C50911 Malignant neoplasm of unspecified site of right female breast: Secondary | ICD-10-CM | POA: Insufficient documentation

## 2015-08-22 DIAGNOSIS — C50919 Malignant neoplasm of unspecified site of unspecified female breast: Secondary | ICD-10-CM

## 2015-08-22 DIAGNOSIS — Z923 Personal history of irradiation: Secondary | ICD-10-CM | POA: Diagnosis not present

## 2015-08-22 DIAGNOSIS — Z79811 Long term (current) use of aromatase inhibitors: Secondary | ICD-10-CM | POA: Insufficient documentation

## 2015-08-22 DIAGNOSIS — I071 Rheumatic tricuspid insufficiency: Secondary | ICD-10-CM | POA: Insufficient documentation

## 2015-08-22 DIAGNOSIS — R5383 Other fatigue: Secondary | ICD-10-CM

## 2015-08-22 DIAGNOSIS — Z170 Estrogen receptor positive status [ER+]: Secondary | ICD-10-CM | POA: Diagnosis not present

## 2015-08-22 DIAGNOSIS — I10 Essential (primary) hypertension: Secondary | ICD-10-CM | POA: Diagnosis not present

## 2015-08-22 DIAGNOSIS — Z86718 Personal history of other venous thrombosis and embolism: Secondary | ICD-10-CM | POA: Diagnosis not present

## 2015-08-22 DIAGNOSIS — C50411 Malignant neoplasm of upper-outer quadrant of right female breast: Secondary | ICD-10-CM | POA: Insufficient documentation

## 2015-08-22 DIAGNOSIS — I4891 Unspecified atrial fibrillation: Secondary | ICD-10-CM | POA: Insufficient documentation

## 2015-08-22 DIAGNOSIS — E119 Type 2 diabetes mellitus without complications: Secondary | ICD-10-CM | POA: Diagnosis not present

## 2015-08-22 DIAGNOSIS — I340 Nonrheumatic mitral (valve) insufficiency: Secondary | ICD-10-CM | POA: Insufficient documentation

## 2015-08-22 DIAGNOSIS — Z8673 Personal history of transient ischemic attack (TIA), and cerebral infarction without residual deficits: Secondary | ICD-10-CM | POA: Diagnosis not present

## 2015-08-22 DIAGNOSIS — Z79899 Other long term (current) drug therapy: Secondary | ICD-10-CM | POA: Insufficient documentation

## 2015-08-22 DIAGNOSIS — I2510 Atherosclerotic heart disease of native coronary artery without angina pectoris: Secondary | ICD-10-CM | POA: Diagnosis not present

## 2015-08-22 DIAGNOSIS — Z9109 Other allergy status, other than to drugs and biological substances: Secondary | ICD-10-CM | POA: Insufficient documentation

## 2015-08-22 DIAGNOSIS — M19049 Primary osteoarthritis, unspecified hand: Secondary | ICD-10-CM | POA: Insufficient documentation

## 2015-08-22 DIAGNOSIS — E785 Hyperlipidemia, unspecified: Secondary | ICD-10-CM | POA: Diagnosis not present

## 2015-08-22 LAB — CBC WITH DIFFERENTIAL/PLATELET
BASOS ABS: 0.1 10*3/uL (ref 0–0.1)
BASOS PCT: 1 %
Eosinophils Absolute: 0.1 10*3/uL (ref 0–0.7)
Eosinophils Relative: 1 %
HEMATOCRIT: 40.9 % (ref 35.0–47.0)
HEMOGLOBIN: 13.7 g/dL (ref 12.0–16.0)
Lymphocytes Relative: 11 %
Lymphs Abs: 0.8 10*3/uL — ABNORMAL LOW (ref 1.0–3.6)
MCH: 31.3 pg (ref 26.0–34.0)
MCHC: 33.5 g/dL (ref 32.0–36.0)
MCV: 93.5 fL (ref 80.0–100.0)
Monocytes Absolute: 0.6 10*3/uL (ref 0.2–0.9)
Monocytes Relative: 7 %
NEUTROS ABS: 6.2 10*3/uL (ref 1.4–6.5)
NEUTROS PCT: 80 %
Platelets: 210 10*3/uL (ref 150–440)
RBC: 4.37 MIL/uL (ref 3.80–5.20)
RDW: 14.2 % (ref 11.5–14.5)
WBC: 7.7 10*3/uL (ref 3.6–11.0)

## 2015-08-22 LAB — COMPREHENSIVE METABOLIC PANEL
ALBUMIN: 4 g/dL (ref 3.5–5.0)
ALK PHOS: 56 U/L (ref 38–126)
ALT: 19 U/L (ref 14–54)
AST: 21 U/L (ref 15–41)
Anion gap: 7 (ref 5–15)
BILIRUBIN TOTAL: 0.6 mg/dL (ref 0.3–1.2)
BUN: 13 mg/dL (ref 6–20)
CO2: 25 mmol/L (ref 22–32)
CREATININE: 0.84 mg/dL (ref 0.44–1.00)
Calcium: 8.5 mg/dL — ABNORMAL LOW (ref 8.9–10.3)
Chloride: 101 mmol/L (ref 101–111)
GFR calc Af Amer: >60 mL/min (ref >60)
GFR calc non Af Amer: >60 mL/min (ref >60)
GLUCOSE: 138 mg/dL — AB (ref 65–99)
POTASSIUM: 4.2 mmol/L (ref 3.5–5.1)
Sodium: 133 mmol/L — ABNORMAL LOW (ref 135–145)
TOTAL PROTEIN: 6.6 g/dL (ref 6.5–8.1)

## 2015-08-22 NOTE — Progress Notes (Signed)
Patient does not have living will.  Never smoked. 

## 2015-08-22 NOTE — Progress Notes (Signed)
[place] @ [facility] [phone]  [phone] OB: 12/12/1927  MR#: [id number]  IBB#:[id number]  Patient Care Team: Idelle Crouch, MD as PCP - General (Internal Medicine)  CHIEF COMPLAINT:  Chief Complaint  Patient presents with  . OTHER     No history exists.  5. 81year-old female status post needle localization for a 6 mm T1 A. invasive mammary carcinoma of the right breast ER positive PR borderline HER-2/neu not overexpressed (status post lumpectomy, Margins were not clear) Patient has finished radiation therapy. 2. Started on anastrozole (January 2014)   INTERVAL HISTORY: 81 year old patient who recently went to emergency room with chest discomfort.  Patient was evaluated and discharged followed by cardiologist. Getting regular mammogram done.  Patient is taking anastrozole for carcinoma of breast No chills fever appetite has been stable REVIEW OF SYSTEMS:    general status: Patient is feeling weak and tired.  No change in a performance status.  No chills.  No fever. HEENT: .  No evidence of stomatitis Lungs: No cough or shortness of breath Cardiac: No chest pain or paroxysmal nocturnal dyspnea GI: No nausea no vomiting no diarrhea no abdominal pain Skin: No rash Lower extremity no swelling Neurological system: No tingling.  No numbness.  No other focal signs Musculoskeletal system no bony pains Examination of right breast.  Status post lumpectomy no evidence of recurrent disease.  Left breast free of masses.  Both axillary area within normal limit As per HPI. Otherwise, a complete review of systems is negatve.  PAST MEDICAL HISTORY: Past Medical History  Diagnosis Date  . Diabetes mellitus without complication   . Hypertension   . Hyperlipemia   . Coronary artery disease   . Stroke   . DVT (deep venous thrombosis)   . Cancer     BL breast    PAST SURGICAL HISTORY: Past Surgical History  Procedure Laterality Date  .  Appendectomy    . Tonsillectomy    . Cholecystectomy    . Breast lumpectomy      removal of CA      Osteoporosis:    Breast Cancer:    obesity:    Environmental allergies:    Osteoarthritis:    Hyperlipidemia:    HX: Atrial Fibrillation:    HX: DVT:    Breast cancer:    HX; Asthma:    Back Pain, Chronic:    Diabetes Mellitus, Type II (NIDD):    Hypercholesterolemia:    cva:    htn:    Tonsillectomy:    Cataract Extraction:    Cholecystectomy:    Appendectomy:    Breast surgery:   Preventive Screening:  Has patient had any of the following test? Mammography (1)   Last Mammography: 11/1`5   Smoking History: Smoking History Never Smoked.(1)  PFSH: Comments: No family history of colorectal cancer, breast cancer, or ovarian cancer.   Marland Kitchen  Positive for hypertension and stroke.  Social History: negative alcohol, negative tobacco  Additional Past Medical and Surgical History: As mentioned above  History of carcinoma of left breast diagnosis in 2007   ADVANCED DIRECTIVES:  No flowsheet data found.  HEALTH MAINTENANCE: Social History  Substance Use Topics  . Smoking status: Never Smoker   . Smokeless tobacco: Never Used  . Alcohol Use: No      Allergies  Allergen Reactions  . Amoxicillin Other (See Comments)    Current Outpatient Prescriptions  Medication Sig Dispense Refill  . acetaminophen (TYLENOL)  500 MG tablet Take 1,000 mg by mouth 2 (two) times daily as needed for mild pain.    Marland Kitchen albuterol (PROVENTIL HFA;VENTOLIN HFA) 108 (90 BASE) MCG/ACT inhaler Inhale 2 puffs into the lungs every 6 (six) hours as needed for wheezing or shortness of breath.    . ALPRAZolam (XANAX) 0.25 MG tablet Take 0.125 mg by mouth at bedtime as needed for anxiety or sleep.    Marland Kitchen anastrozole (ARIMIDEX) 1 MG tablet Take 1 mg by mouth daily.    . budesonide-formoterol (SYMBICORT) 160-4.5 MCG/ACT inhaler Inhale 2 puffs into the lungs 2 (two) times daily.    Marland Kitchen  CALCIUM-VITAMIN D-VITAMIN K PO Take 1 tablet by mouth 2 (two) times daily.    . dabigatran (PRADAXA) 150 MG CAPS capsule Take 150 mg by mouth 2 (two) times daily.    Marland Kitchen diltiazem (DILACOR XR) 240 MG 24 hr capsule Take 240 mg by mouth daily.    . diphenhydramine-acetaminophen (TYLENOL PM) 25-500 MG TABS Take 1 tablet by mouth at bedtime as needed (for sleep).    . furosemide (LASIX) 40 MG tablet Take 40 mg by mouth daily at 12 noon.    Marland Kitchen glimepiride (AMARYL) 4 MG tablet Take 4 mg by mouth daily.    Marland Kitchen losartan (COZAAR) 50 MG tablet Take 50 mg by mouth daily.    . magnesium oxide (MAG-OX) 400 MG tablet Take 400 mg by mouth daily at 12 noon.    . Multiple Vitamin (MULTIVITAMIN WITH MINERALS) TABS tablet Take 1 tablet by mouth daily at 12 noon.    . pantoprazole (PROTONIX) 40 MG tablet Take 40 mg by mouth daily.    Vladimir Faster Glycol-Propyl Glycol (SYSTANE OP) Apply 1 drop to eye 4 (four) times daily as needed (for dry eyes).    . potassium chloride (K-DUR) 10 MEQ tablet Take 10 mEq by mouth daily at 12 noon.     No current facility-administered medications for this visit.    OBJECTIVE:  Filed Vitals:   08/22/15 1207  BP: 138/62  Pulse: 86  Temp: 97.3 F (36.3 C)     Body mass index is 30.71 kg/(m^2).    ECOG FS:1 - Symptomatic but completely ambulatory  PHYSICAL EXAM: GENERAL:  Well developed, well nourished, sitting comfortably in the exam room in no acute distress. MENTAL STATUS:  Alert and oriented to person, place and time.  RESPIRATORY:  Clear to auscultation without rales, wheezes or rhonchi. CARDIOVASCULAR:  Regular rate and rhythm without murmur, rub or gallop. BREAST:  Right breast without masses, skin changes or nipple discharge.  Left breast without masses, skin changes or nipple discharge. ABDOMEN:  Soft, non-tender, with active bowel sounds, and no hepatosplenomegaly.  No masses. BACK:  No CVA tenderness.  No tenderness on percussion of the back or rib cage. SKIN:  No  rashes, ulcers or lesions. EXTREMITIES: No edema, no skin discoloration or tenderness.  No palpable cords. LYMPH NODES: No palpable cervical, supraclavicular, axillary or inguinal adenopathy  NEUROLOGICAL: Unremarkable. PSYCH:  Appropriate.   LAB RESULTS:  CBC Latest Ref Rng 08/22/2015 08/05/2015  WBC 3.6 - 11.0 K/uL 7.7 8.3  Hemoglobin 12.0 - 16.0 g/dL 13.7 14.2  Hematocrit 35.0 - 47.0 % 40.9 42.9  Platelets 150 - 440 K/uL 210 206    Appointment on 08/22/2015  Component Date Value Ref Range Status  . WBC 08/22/2015 7.7  3.6 - 11.0 K/uL Final  . RBC 08/22/2015 4.37  3.80 - 5.20 MIL/uL Final  . Hemoglobin 08/22/2015 13.7  12.0 - 16.0 g/dL Final  . HCT 08/22/2015 40.9  35.0 - 47.0 % Final  . MCV 08/22/2015 93.5  80.0 - 100.0 fL Final  . MCH 08/22/2015 31.3  26.0 - 34.0 pg Final  . MCHC 08/22/2015 33.5  32.0 - 36.0 g/dL Final  . RDW 08/22/2015 14.2  11.5 - 14.5 % Final  . Platelets 08/22/2015 210  150 - 440 K/uL Final  . Neutrophils Relative % 08/22/2015 80   Final  . Neutro Abs 08/22/2015 6.2  1.4 - 6.5 K/uL Final  . Lymphocytes Relative 08/22/2015 11   Final  . Lymphs Abs 08/22/2015 0.8* 1.0 - 3.6 K/uL Final  . Monocytes Relative 08/22/2015 7   Final  . Monocytes Absolute 08/22/2015 0.6  0.2 - 0.9 K/uL Final  . Eosinophils Relative 08/22/2015 1   Final  . Eosinophils Absolute 08/22/2015 0.1  0 - 0.7 K/uL Final  . Basophils Relative 08/22/2015 1   Final  . Basophils Absolute 08/22/2015 0.1  0 - 0.1 K/uL Final  . Sodium 08/22/2015 133* 135 - 145 mmol/L Final  . Potassium 08/22/2015 4.2  3.5 - 5.1 mmol/L Final  . Chloride 08/22/2015 101  101 - 111 mmol/L Final  . CO2 08/22/2015 25  22 - 32 mmol/L Final  . Glucose, Bld 08/22/2015 138* 65 - 99 mg/dL Final  . BUN 08/22/2015 13  6 - 20 mg/dL Final  . Creatinine, Ser 08/22/2015 0.84  0.44 - 1.00 mg/dL Final  . Calcium 08/22/2015 8.5* 8.9 - 10.3 mg/dL Final  . Total Protein 08/22/2015 6.6  6.5 - 8.1 g/dL Final  . Albumin 08/22/2015  4.0  3.5 - 5.0 g/dL Final  . AST 08/22/2015 21  15 - 41 U/L Final  . ALT 08/22/2015 19  14 - 54 U/L Final  . Alkaline Phosphatase 08/22/2015 56  38 - 126 U/L Final  . Total Bilirubin 08/22/2015 0.6  0.3 - 1.2 mg/dL Final  . GFR calc non Af Amer 08/22/2015 >60  >60 mL/min Final  . GFR calc Af Amer 08/22/2015 >60  >60 mL/min Final   Comment: (NOTE) The eGFR has been calculated using the CKD EPI equation. This calculation has not been validated in all clinical situations. eGFR's persistently <60 mL/min signify possible Chronic Kidney Disease.   . Anion gap 08/22/2015 7  5 - 15 Final       STUDIES: Dg Chest 2 View  08/05/2015   CLINICAL DATA:  Shortness of breath. Weakness. Duration of symptoms: 1 week.  EXAM: CHEST  2 VIEW  COMPARISON:  01/14/2014  FINDINGS: Moderate enlargement of the cardiopericardial silhouette. Atherosclerotic aortic arch.  Bony demineralization.  The lungs appear clear.  No pleural effusion.  Mild thoracic spondylosis.  IMPRESSION: 1. Moderate cardiomegaly, without edema. 2. Bony demineralization. 3. Mild thoracic spondylosis. 4. Aortic atherosclerosis.   Electronically Signed   By: Van Clines M.D.   On: 08/05/2015 17:48   Patient is due for mammogram in November which will be ordered. Reevaluation in 1 year Discussed possibility ASSESSMENT: Carcinoma of breast there is no evidence of recurrent disease  Patient is due for mammogram in November which will be ordered. Reevaluation in 1 year Discussed possibility  MEDICAL DECISION MAKING:  I discussed possibility of stopping anastrozole because of patient's old age and osteoporosis patient desires to continue for a while and consider stopping it the patient's symptoms of bony pain gets worse Continue calcium and vitamin D  Patient expressed understanding and was in agreement with this  plan. She also understands that She can call clinic at any time with any questions, concerns, or complaints.    No  matching staging information was found for the patient.  Forest Gleason, MD   08/22/2015 12:27 PM

## 2015-08-23 LAB — CANCER ANTIGEN 27.29: CA 27.29: 28.1 U/mL (ref 0.0–38.6)

## 2015-08-25 ENCOUNTER — Telehealth: Payer: Self-pay | Admitting: *Deleted

## 2015-08-25 NOTE — Telephone Encounter (Signed)
Called patient to let her know that Dr. Oliva Bustard ordered mammogram and bone density to check the strength of her bones.  Verbalized understanding.

## 2015-11-03 ENCOUNTER — Emergency Department: Payer: Medicare Other

## 2015-11-03 ENCOUNTER — Inpatient Hospital Stay
Admission: EM | Admit: 2015-11-03 | Discharge: 2015-11-08 | DRG: 871 | Disposition: A | Discharge status: Skilled Nursing Facility | Payer: Medicare Other | Attending: Internal Medicine | Admitting: Internal Medicine

## 2015-11-03 ENCOUNTER — Ambulatory Visit: Payer: Medicare Other

## 2015-11-03 ENCOUNTER — Other Ambulatory Visit: Payer: Medicare Other

## 2015-11-03 ENCOUNTER — Encounter: Payer: Self-pay | Admitting: Emergency Medicine

## 2015-11-03 DIAGNOSIS — R059 Cough, unspecified: Secondary | ICD-10-CM

## 2015-11-03 DIAGNOSIS — J441 Chronic obstructive pulmonary disease with (acute) exacerbation: Secondary | ICD-10-CM | POA: Diagnosis present

## 2015-11-03 DIAGNOSIS — Z79899 Other long term (current) drug therapy: Secondary | ICD-10-CM | POA: Diagnosis not present

## 2015-11-03 DIAGNOSIS — Y92239 Unspecified place in hospital as the place of occurrence of the external cause: Secondary | ICD-10-CM | POA: Diagnosis present

## 2015-11-03 DIAGNOSIS — J9621 Acute and chronic respiratory failure with hypoxia: Secondary | ICD-10-CM | POA: Diagnosis present

## 2015-11-03 DIAGNOSIS — N289 Disorder of kidney and ureter, unspecified: Secondary | ICD-10-CM | POA: Diagnosis present

## 2015-11-03 DIAGNOSIS — Z880 Allergy status to penicillin: Secondary | ICD-10-CM

## 2015-11-03 DIAGNOSIS — Z95810 Presence of automatic (implantable) cardiac defibrillator: Secondary | ICD-10-CM | POA: Diagnosis not present

## 2015-11-03 DIAGNOSIS — I2510 Atherosclerotic heart disease of native coronary artery without angina pectoris: Secondary | ICD-10-CM | POA: Diagnosis present

## 2015-11-03 DIAGNOSIS — G4733 Obstructive sleep apnea (adult) (pediatric): Secondary | ICD-10-CM | POA: Diagnosis present

## 2015-11-03 DIAGNOSIS — I10 Essential (primary) hypertension: Secondary | ICD-10-CM | POA: Diagnosis present

## 2015-11-03 DIAGNOSIS — Z9049 Acquired absence of other specified parts of digestive tract: Secondary | ICD-10-CM

## 2015-11-03 DIAGNOSIS — E871 Hypo-osmolality and hyponatremia: Secondary | ICD-10-CM | POA: Diagnosis not present

## 2015-11-03 DIAGNOSIS — J45909 Unspecified asthma, uncomplicated: Secondary | ICD-10-CM | POA: Diagnosis present

## 2015-11-03 DIAGNOSIS — J180 Bronchopneumonia, unspecified organism: Secondary | ICD-10-CM | POA: Diagnosis present

## 2015-11-03 DIAGNOSIS — D72829 Elevated white blood cell count, unspecified: Secondary | ICD-10-CM | POA: Diagnosis not present

## 2015-11-03 DIAGNOSIS — W19XXXA Unspecified fall, initial encounter: Secondary | ICD-10-CM | POA: Diagnosis present

## 2015-11-03 DIAGNOSIS — J440 Chronic obstructive pulmonary disease with acute lower respiratory infection: Secondary | ICD-10-CM | POA: Diagnosis present

## 2015-11-03 DIAGNOSIS — I5033 Acute on chronic diastolic (congestive) heart failure: Secondary | ICD-10-CM | POA: Diagnosis present

## 2015-11-03 DIAGNOSIS — J189 Pneumonia, unspecified organism: Secondary | ICD-10-CM

## 2015-11-03 DIAGNOSIS — E119 Type 2 diabetes mellitus without complications: Secondary | ICD-10-CM | POA: Diagnosis present

## 2015-11-03 DIAGNOSIS — R05 Cough: Secondary | ICD-10-CM | POA: Diagnosis not present

## 2015-11-03 DIAGNOSIS — D72828 Other elevated white blood cell count: Secondary | ICD-10-CM

## 2015-11-03 DIAGNOSIS — A419 Sepsis, unspecified organism: Principal | ICD-10-CM

## 2015-11-03 DIAGNOSIS — Z853 Personal history of malignant neoplasm of breast: Secondary | ICD-10-CM | POA: Diagnosis not present

## 2015-11-03 DIAGNOSIS — Z8673 Personal history of transient ischemic attack (TIA), and cerebral infarction without residual deficits: Secondary | ICD-10-CM

## 2015-11-03 DIAGNOSIS — I739 Peripheral vascular disease, unspecified: Secondary | ICD-10-CM | POA: Diagnosis present

## 2015-11-03 DIAGNOSIS — R509 Fever, unspecified: Secondary | ICD-10-CM

## 2015-11-03 DIAGNOSIS — I4891 Unspecified atrial fibrillation: Secondary | ICD-10-CM | POA: Diagnosis present

## 2015-11-03 DIAGNOSIS — Z86718 Personal history of other venous thrombosis and embolism: Secondary | ICD-10-CM

## 2015-11-03 DIAGNOSIS — I081 Rheumatic disorders of both mitral and tricuspid valves: Secondary | ICD-10-CM | POA: Diagnosis present

## 2015-11-03 DIAGNOSIS — T380X5A Adverse effect of glucocorticoids and synthetic analogues, initial encounter: Secondary | ICD-10-CM | POA: Diagnosis present

## 2015-11-03 DIAGNOSIS — D729 Disorder of white blood cells, unspecified: Secondary | ICD-10-CM

## 2015-11-03 DIAGNOSIS — J209 Acute bronchitis, unspecified: Secondary | ICD-10-CM

## 2015-11-03 LAB — URINALYSIS COMPLETE WITH MICROSCOPIC (ARMC ONLY)
BACTERIA UA: NONE SEEN
Bilirubin Urine: NEGATIVE
Glucose, UA: NEGATIVE mg/dL
Hgb urine dipstick: NEGATIVE
LEUKOCYTES UA: NEGATIVE
Nitrite: NEGATIVE
PROTEIN: 30 mg/dL — AB
SPECIFIC GRAVITY, URINE: 1.013 (ref 1.005–1.030)
pH: 7 (ref 5.0–8.0)

## 2015-11-03 LAB — COMPREHENSIVE METABOLIC PANEL
ALK PHOS: 59 U/L (ref 38–126)
ALT: 15 U/L (ref 14–54)
AST: 18 U/L (ref 15–41)
Albumin: 3.9 g/dL (ref 3.5–5.0)
Anion gap: 8 (ref 5–15)
BUN: 12 mg/dL (ref 6–20)
CALCIUM: 9.1 mg/dL (ref 8.9–10.3)
CHLORIDE: 101 mmol/L (ref 101–111)
CO2: 24 mmol/L (ref 22–32)
CREATININE: 0.76 mg/dL (ref 0.44–1.00)
GFR calc Af Amer: >60 mL/min (ref >60)
GFR calc non Af Amer: >60 mL/min (ref >60)
Glucose, Bld: 108 mg/dL — ABNORMAL HIGH (ref 65–99)
Potassium: 4.2 mmol/L (ref 3.5–5.1)
Sodium: 133 mmol/L — ABNORMAL LOW (ref 135–145)
Total Bilirubin: 0.4 mg/dL (ref 0.3–1.2)
Total Protein: 6.8 g/dL (ref 6.5–8.1)

## 2015-11-03 LAB — GLUCOSE, CAPILLARY
GLUCOSE-CAPILLARY: 172 mg/dL — AB (ref 65–99)
Glucose-Capillary: 114 mg/dL — ABNORMAL HIGH (ref 65–99)

## 2015-11-03 LAB — CBC WITH DIFFERENTIAL/PLATELET
BASOS PCT: 1 %
Basophils Absolute: 0.1 10*3/uL (ref 0–0.1)
EOS ABS: 0 10*3/uL (ref 0–0.7)
EOS PCT: 0 %
HCT: 40.2 % (ref 35.0–47.0)
Hemoglobin: 13.3 g/dL (ref 12.0–16.0)
LYMPHS ABS: 0.4 10*3/uL — AB (ref 1.0–3.6)
Lymphocytes Relative: 4 %
MCH: 31.2 pg (ref 26.0–34.0)
MCHC: 33 g/dL (ref 32.0–36.0)
MCV: 94.5 fL (ref 80.0–100.0)
MONO ABS: 0.7 10*3/uL (ref 0.2–0.9)
MONOS PCT: 8 %
Neutro Abs: 8.2 10*3/uL — ABNORMAL HIGH (ref 1.4–6.5)
Neutrophils Relative %: 87 %
PLATELETS: 197 10*3/uL (ref 150–440)
RBC: 4.26 MIL/uL (ref 3.80–5.20)
RDW: 14.4 % (ref 11.5–14.5)
WBC: 9.3 10*3/uL (ref 3.6–11.0)

## 2015-11-03 LAB — TROPONIN I

## 2015-11-03 LAB — LACTIC ACID, PLASMA
LACTIC ACID, VENOUS: 0.8 mmol/L (ref 0.5–2.0)
Lactic Acid, Venous: 1 mmol/L (ref 0.5–2.0)

## 2015-11-03 MED ORDER — PANTOPRAZOLE SODIUM 40 MG PO TBEC
40.0000 mg | DELAYED_RELEASE_TABLET | Freq: Every day | ORAL | Status: DC
  Administered 2015-11-04 – 2015-11-07 (×4): 40 mg via ORAL
  Filled 2015-11-03 (×4): qty 1

## 2015-11-03 MED ORDER — INSULIN ASPART 100 UNIT/ML ~~LOC~~ SOLN
0.0000 [IU] | Freq: Three times a day (TID) | SUBCUTANEOUS | Status: DC
  Administered 2015-11-04 (×2): 2 [IU] via SUBCUTANEOUS
  Administered 2015-11-05: 3 [IU] via SUBCUTANEOUS
  Administered 2015-11-05: 5 [IU] via SUBCUTANEOUS
  Administered 2015-11-06: 2 [IU] via SUBCUTANEOUS
  Administered 2015-11-06: 3 [IU] via SUBCUTANEOUS
  Administered 2015-11-07: 1 [IU] via SUBCUTANEOUS
  Administered 2015-11-07: 2 [IU] via SUBCUTANEOUS
  Filled 2015-11-03: qty 1
  Filled 2015-11-03 (×2): qty 2
  Filled 2015-11-03: qty 1
  Filled 2015-11-03: qty 2
  Filled 2015-11-03: qty 9
  Filled 2015-11-03: qty 3

## 2015-11-03 MED ORDER — ONDANSETRON HCL 4 MG/2ML IJ SOLN: 4.0000 mg | Freq: Four times a day (QID) | INTRAMUSCULAR | Status: DC | PRN

## 2015-11-03 MED ORDER — MAGNESIUM OXIDE 400 (241.3 MG) MG PO TABS
400.0000 mg | ORAL_TABLET | Freq: Every day | ORAL | Status: DC
  Administered 2015-11-04 – 2015-11-07 (×4): 400 mg via ORAL
  Filled 2015-11-03 (×4): qty 1

## 2015-11-03 MED ORDER — ACETAMINOPHEN 500 MG PO TABS
1000.0000 mg | ORAL_TABLET | Freq: Two times a day (BID) | ORAL | Status: DC | PRN
  Administered 2015-11-04: 1000 mg via ORAL
  Filled 2015-11-03: qty 2

## 2015-11-03 MED ORDER — FUROSEMIDE 10 MG/ML IJ SOLN
40.0000 mg | Freq: Two times a day (BID) | INTRAMUSCULAR | Status: DC
  Administered 2015-11-03 – 2015-11-04 (×2): 40 mg via INTRAVENOUS
  Filled 2015-11-03 (×2): qty 4

## 2015-11-03 MED ORDER — HYDROCOD POLST-CPM POLST ER 10-8 MG/5ML PO SUER
5.0000 mL | Freq: Two times a day (BID) | ORAL | Status: DC
  Administered 2015-11-03 – 2015-11-07 (×8): 5 mL via ORAL
  Filled 2015-11-03 (×8): qty 5

## 2015-11-03 MED ORDER — NITROGLYCERIN 2 % TD OINT
1.0000 [in_us] | TOPICAL_OINTMENT | Freq: Four times a day (QID) | TRANSDERMAL | Status: DC
  Administered 2015-11-03 – 2015-11-04 (×3): 1 [in_us] via TOPICAL
  Filled 2015-11-03 (×3): qty 1

## 2015-11-03 MED ORDER — AZITHROMYCIN 500 MG IV SOLR
500.0000 mg | Freq: Once | INTRAVENOUS | Status: AC
  Administered 2015-11-03: 500 mg via INTRAVENOUS
  Filled 2015-11-03: qty 500

## 2015-11-03 MED ORDER — DIPHENHYDRAMINE-APAP (SLEEP) 25-500 MG PO TABS: 1.0000 | ORAL_TABLET | Freq: Every evening | ORAL | Status: DC | PRN

## 2015-11-03 MED ORDER — SODIUM CHLORIDE 0.9 % IJ SOLN
3.0000 mL | Freq: Two times a day (BID) | INTRAMUSCULAR | Status: DC
  Administered 2015-11-03 – 2015-11-06 (×8): 3 mL via INTRAVENOUS

## 2015-11-03 MED ORDER — METHYLPREDNISOLONE SODIUM SUCC 125 MG IJ SOLR
60.0000 mg | INTRAMUSCULAR | Status: DC
  Administered 2015-11-03 – 2015-11-06 (×4): 60 mg via INTRAVENOUS
  Filled 2015-11-03 (×4): qty 2

## 2015-11-03 MED ORDER — DEXTROSE 5 % IV SOLN
500.0000 mg | INTRAVENOUS | Status: DC
  Administered 2015-11-04 – 2015-11-06 (×3): 500 mg via INTRAVENOUS
  Filled 2015-11-03 (×4): qty 500

## 2015-11-03 MED ORDER — ALBUTEROL SULFATE (2.5 MG/3ML) 0.083% IN NEBU
2.5000 mg | INHALATION_SOLUTION | Freq: Four times a day (QID) | RESPIRATORY_TRACT | Status: DC | PRN
  Administered 2015-11-03 – 2015-11-04 (×3): 2.5 mg via RESPIRATORY_TRACT
  Filled 2015-11-03 (×3): qty 3

## 2015-11-03 MED ORDER — ACETAMINOPHEN 650 MG RE SUPP: 650.0000 mg | Freq: Four times a day (QID) | RECTAL | Status: DC | PRN

## 2015-11-03 MED ORDER — GLIMEPIRIDE 4 MG PO TABS
4.0000 mg | ORAL_TABLET | Freq: Every day | ORAL | Status: DC
Start: 2015-11-03 — End: 2015-11-08
  Administered 2015-11-03 – 2015-11-07 (×5): 4 mg via ORAL
  Filled 2015-11-03 (×7): qty 1

## 2015-11-03 MED ORDER — DABIGATRAN ETEXILATE MESYLATE 75 MG PO CAPS
150.0000 mg | ORAL_CAPSULE | Freq: Two times a day (BID) | ORAL | Status: DC
  Administered 2015-11-03 – 2015-11-07 (×9): 150 mg via ORAL
  Filled 2015-11-03 (×9): qty 2

## 2015-11-03 MED ORDER — INSULIN ASPART 100 UNIT/ML ~~LOC~~ SOLN
0.0000 [IU] | Freq: Every day | SUBCUTANEOUS | Status: DC
  Administered 2015-11-04 – 2015-11-06 (×2): 2 [IU] via SUBCUTANEOUS
  Filled 2015-11-03 (×2): qty 2
  Filled 2015-11-03: qty 4
  Filled 2015-11-03: qty 2

## 2015-11-03 MED ORDER — LOSARTAN POTASSIUM 50 MG PO TABS
50.0000 mg | ORAL_TABLET | Freq: Every day | ORAL | Status: DC
  Administered 2015-11-03 – 2015-11-07 (×5): 50 mg via ORAL
  Filled 2015-11-03 (×5): qty 1

## 2015-11-03 MED ORDER — INSULIN ASPART 100 UNIT/ML ~~LOC~~ SOLN
4.0000 [IU] | Freq: Three times a day (TID) | SUBCUTANEOUS | Status: DC
  Administered 2015-11-04 – 2015-11-07 (×10): 4 [IU] via SUBCUTANEOUS
  Filled 2015-11-03 (×8): qty 4

## 2015-11-03 MED ORDER — LUBRIFRESH P.M. OP OINT
1.0000 "application " | TOPICAL_OINTMENT | Freq: Four times a day (QID) | OPHTHALMIC | Status: DC | PRN
  Filled 2015-11-03: qty 1

## 2015-11-03 MED ORDER — ACETAMINOPHEN 325 MG PO TABS
ORAL_TABLET | ORAL | Status: AC
  Administered 2015-11-03: 325 mg
  Filled 2015-11-03: qty 2

## 2015-11-03 MED ORDER — ANASTROZOLE 1 MG PO TABS
1.0000 mg | ORAL_TABLET | Freq: Every day | ORAL | Status: DC
  Administered 2015-11-03 – 2015-11-07 (×5): 1 mg via ORAL
  Filled 2015-11-03 (×5): qty 1

## 2015-11-03 MED ORDER — POTASSIUM CHLORIDE CRYS ER 20 MEQ PO TBCR
20.0000 meq | EXTENDED_RELEASE_TABLET | Freq: Every day | ORAL | Status: DC
  Administered 2015-11-03 – 2015-11-07 (×5): 20 meq via ORAL
  Filled 2015-11-03 (×5): qty 1

## 2015-11-03 MED ORDER — DEXTROSE 5 % IV SOLN
1.0000 g | INTRAVENOUS | Status: DC
  Administered 2015-11-04 – 2015-11-07 (×4): 1 g via INTRAVENOUS
  Filled 2015-11-03 (×4): qty 10

## 2015-11-03 MED ORDER — DILTIAZEM HCL 25 MG/5ML IV SOLN
15.0000 mg | Freq: Once | INTRAVENOUS | Status: AC
  Administered 2015-11-03: 15 mg via INTRAVENOUS
  Filled 2015-11-03: qty 5

## 2015-11-03 MED ORDER — DILTIAZEM HCL ER 240 MG PO CP24
240.0000 mg | ORAL_CAPSULE | Freq: Every day | ORAL | Status: DC
  Administered 2015-11-03 – 2015-11-07 (×5): 240 mg via ORAL
  Filled 2015-11-03 (×10): qty 1

## 2015-11-03 MED ORDER — BUDESONIDE-FORMOTEROL FUMARATE 160-4.5 MCG/ACT IN AERO
2.0000 | INHALATION_SPRAY | Freq: Two times a day (BID) | RESPIRATORY_TRACT | Status: DC
  Administered 2015-11-03 – 2015-11-07 (×8): 2 via RESPIRATORY_TRACT
  Filled 2015-11-03: qty 6

## 2015-11-03 MED ORDER — ZOLPIDEM TARTRATE 5 MG PO TABS
5.0000 mg | ORAL_TABLET | Freq: Every evening | ORAL | Status: DC | PRN
  Administered 2015-11-05 (×2): 5 mg via ORAL
  Filled 2015-11-03 (×3): qty 1

## 2015-11-03 MED ORDER — SODIUM CHLORIDE 0.9 % IV BOLUS (SEPSIS)
1000.0000 mL | INTRAVENOUS | Status: AC
  Administered 2015-11-03 (×2): 1000 mL via INTRAVENOUS

## 2015-11-03 MED ORDER — DEXTROSE 5 % IV SOLN
1.0000 g | Freq: Once | INTRAVENOUS | Status: AC
Start: 2015-11-03 — End: 2015-11-03
  Administered 2015-11-03: 1 g via INTRAVENOUS
  Filled 2015-11-03: qty 10

## 2015-11-03 MED ORDER — SODIUM CHLORIDE 0.9 % IV BOLUS (SEPSIS): 500.0000 mL | INTRAVENOUS | Status: DC

## 2015-11-03 MED ORDER — ALPRAZOLAM 0.25 MG PO TABS
0.1250 mg | ORAL_TABLET | Freq: Every evening | ORAL | Status: DC | PRN
  Administered 2015-11-03: 0.125 mg via ORAL
  Filled 2015-11-03: qty 1

## 2015-11-03 MED ORDER — ONDANSETRON HCL 4 MG PO TABS: 4.0000 mg | ORAL_TABLET | Freq: Four times a day (QID) | ORAL | Status: DC | PRN

## 2015-11-03 MED ORDER — ACETAMINOPHEN 325 MG PO TABS
650.0000 mg | ORAL_TABLET | Freq: Four times a day (QID) | ORAL | Status: DC | PRN
  Administered 2015-11-03 – 2015-11-06 (×2): 650 mg via ORAL
  Filled 2015-11-03 (×2): qty 2

## 2015-11-03 NOTE — ED Notes (Signed)
Pt here via EMS with c/o cough, sob, increasing fever, weakness, nausea and htn. States Dr Doy Hutching is treating her cough with Zpack currently. Pt has a hx of afib, showing afib on the monitor, BG per ems 108. Pt denies pain at this time. Productive cough with thick yellow sputum noted. Pt appears in no distress at this time. Sats 90% on RA.

## 2015-11-03 NOTE — Consult Note (Signed)
ANTIBIOTIC CONSULT NOTE - INITIAL  Pharmacy Consult for azithromycin/ceftriaxone Indication: sepsis due to acute bronchitis  Allergies  Allergen Reactions  . Amoxicillin Other (See Comments)    Patient Measurements: Height: 4\' 11"  (149.9 cm) Weight: 148 lb (67.132 kg) IBW/kg (Calculated) : 43.2 Adjusted Body Weight:   Vital Signs: Temp: 99.7 F (37.6 C) (12/01 1103) Temp Source: Oral (12/01 1103) BP: 168/58 mmHg (12/01 1230) Pulse Rate: 82 (12/01 1230) Intake/Output from previous day:   Intake/Output from this shift:    Labs:  Recent Labs  11/03/15 0911  WBC 9.3  HGB 13.3  PLT 197  CREATININE 0.76   Estimated Creatinine Clearance: 42.9 mL/min (by C-G formula based on Cr of 0.76). No results for input(s): VANCOTROUGH, VANCOPEAK, VANCORANDOM, GENTTROUGH, GENTPEAK, GENTRANDOM, TOBRATROUGH, TOBRAPEAK, TOBRARND, AMIKACINPEAK, AMIKACINTROU, AMIKACIN in the last 72 hours.   Microbiology: No results found for this or any previous visit (from the past 720 hour(s)).  Medical History: Past Medical History  Diagnosis Date  . Diabetes mellitus without complication ([place])   . Hypertension   . Hyperlipemia   . Coronary artery disease   . Stroke ([place])   . DVT (deep venous thrombosis) ([place])   . Cancer ([place])     BL breast    Medications:  Scheduled:  . furosemide  40 mg Intravenous Q12H  . insulin aspart  0-5 Units Subcutaneous QHS  . insulin aspart  0-9 Units Subcutaneous TID WC  . insulin aspart  4 Units Subcutaneous TID WC  . methylPREDNISolone (SOLU-MEDROL) injection  60 mg Intravenous Q24H  . nitroGLYCERIN  1 inch Topical 4 times per day  . potassium chloride  20 mEq Oral Daily   Assessment: Pt is a 81 year old female with a hx of cough with yellow phlegm x 1.5 weeks. In ER pt is febrile, sob, hypoxic, tachypneic, in afib and hypertensive. Pt started taking a zpak from her PCP 2 days ago on the 29th. Chest x-ray neg for PNA.   Goal of Therapy:  resolution of  infection  Plan:  Follow up culture results Will continue azithromycin 500mg  daily and ceftriaxone 1g daily. pharmacy to continue to monitor. thank you for the consult  Ramond Dial 11/03/2015,12:48 PM

## 2015-11-03 NOTE — ED Provider Notes (Signed)
[facility] Emergency Department Provider Note    ____________________________________________  Time seen: 0920  I have reviewed the triage vital signs and the nursing notes.   HISTORY  Chief Complaint Cough; Fever; and Hypertension   History limited by: Not Limited   HPI Rachael Horne is a 81 y.o. female who presents to the emergency department today because of concerns for continued cough and weakness. Patient states that she started coughing 1 week ago. She was put on a Z-Pak by her primary care doctor and states she has had 2 days of this. She states she is not feeling any better. Her cough is productive of yellowish phlegm. She has had some central chest discomfort. She feels like she has had some fever the past day. She denies history of pneumonia in the past. States she does have history of asthma.   Past Medical History  Diagnosis Date  . Diabetes mellitus without complication ([place])   . Hypertension   . Hyperlipemia   . Coronary artery disease   . Stroke ([place])   . DVT (deep venous thrombosis) ([place])   . Cancer [facility])     BL breast    Patient Active Problem List   Diagnosis Date Noted  . A-fib ([place]) 08/22/2015  . Breast CA ([place]) 08/22/2015  . Diabetes mellitus, type 2 ([place]) 08/22/2015  . Allergy to environmental factors 08/22/2015  . History of knee problem 08/22/2015  . HLD (hyperlipidemia) 08/22/2015  . BP (high blood pressure) 08/22/2015  . MI (mitral incompetence) 08/22/2015  . Arthritis of hand, degenerative 08/22/2015  . TI (tricuspid incompetence) 08/22/2015  . Bradycardia 08/23/2014  . Arteriosclerosis of coronary artery 08/23/2014  . History of asthma 08/23/2014  . H/O deep venous thrombosis 08/23/2014  . Arthritis, degenerative 08/23/2014  . Adiposity 08/23/2014  . Temporary cerebral vascular dysfunction 08/23/2014  . Awareness of heartbeats 08/23/2014  . Absolute anemia 05/06/2014  . Cerebrovascular accident, old  07/03/1997    Past Surgical History  Procedure Laterality Date  . Appendectomy    . Tonsillectomy    . Cholecystectomy    . Breast lumpectomy      removal of CA    Current Outpatient Rx  Name  Route  Sig  Dispense  Refill  . acetaminophen (TYLENOL) 500 MG tablet   Oral   Take 1,000 mg by mouth 2 (two) times daily as needed for mild pain.         Marland Kitchen albuterol (PROVENTIL HFA;VENTOLIN HFA) 108 (90 BASE) MCG/ACT inhaler   Inhalation   Inhale 2 puffs into the lungs every 6 (six) hours as needed for wheezing or shortness of breath.         . ALPRAZolam (XANAX) 0.25 MG tablet   Oral   Take 0.125 mg by mouth at bedtime as needed for anxiety or sleep.         Marland Kitchen anastrozole (ARIMIDEX) 1 MG tablet   Oral   Take 1 mg by mouth daily.         . budesonide-formoterol (SYMBICORT) 160-4.5 MCG/ACT inhaler   Inhalation   Inhale 2 puffs into the lungs 2 (two) times daily.         Marland Kitchen CALCIUM-VITAMIN D-VITAMIN K PO   Oral   Take 1 tablet by mouth 2 (two) times daily.         . dabigatran (PRADAXA) 150 MG CAPS capsule   Oral   Take 150 mg by mouth 2 (two) times daily.         Marland Kitchen  diltiazem (DILACOR XR) 240 MG 24 hr capsule   Oral   Take 240 mg by mouth daily.         . diphenhydramine-acetaminophen (TYLENOL PM) 25-500 MG TABS   Oral   Take 1 tablet by mouth at bedtime as needed (for sleep).         . furosemide (LASIX) 40 MG tablet   Oral   Take 40 mg by mouth daily at 12 noon.         Marland Kitchen glimepiride (AMARYL) 4 MG tablet   Oral   Take 4 mg by mouth daily.         Marland Kitchen losartan (COZAAR) 50 MG tablet   Oral   Take 50 mg by mouth daily.         . magnesium oxide (MAG-OX) 400 MG tablet   Oral   Take 400 mg by mouth daily at 12 noon.         . Multiple Vitamin (MULTIVITAMIN WITH MINERALS) TABS tablet   Oral   Take 1 tablet by mouth daily at 12 noon.         . pantoprazole (PROTONIX) 40 MG tablet   Oral   Take 40 mg by mouth daily.         Vladimir Faster Glycol-Propyl Glycol (SYSTANE OP)   Ophthalmic   Apply 1 drop to eye 4 (four) times daily as needed (for dry eyes).         . potassium chloride (K-DUR) 10 MEQ tablet   Oral   Take 10 mEq by mouth daily at 12 noon.           Allergies Amoxicillin  No family history on file.  Social History Social History  Substance Use Topics  . Smoking status: Never Smoker   . Smokeless tobacco: Never Used  . Alcohol Use: No    Review of Systems  Constitutional: Negative for fever. Cardiovascular: Negative for chest pain. Respiratory: Positive for cough Gastrointestinal: Negative for abdominal pain, vomiting and diarrhea. Genitourinary: Negative for dysuria. Musculoskeletal: Negative for back pain. Skin: Negative for rash. Neurological: Negative for headaches, focal weakness or numbness. 10-point ROS otherwise negative.  ____________________________________________   PHYSICAL EXAM:  VITAL SIGNS: ED Triage Vitals  Enc Vitals Group     BP --      Pulse Rate 11/03/15 0859 160     Resp 11/03/15 0859 26     Temp --      Temp src --      SpO2 11/03/15 0859 90 %     Weight 11/03/15 0859 148 lb (67.132 kg)     Height 11/03/15 0859 4\' 11"  (1.499 m)   Constitutional: Alert and oriented. Well appearing and in no distress. Occasional dry cough Eyes: Conjunctivae are normal. PERRL. Normal extraocular movements. ENT   Head: Normocephalic and atraumatic.   Nose: No congestion/rhinnorhea.   Mouth/Throat: Mucous membranes are moist.   Neck: No stridor. Hematological/Lymphatic/Immunilogical: No cervical lymphadenopathy. Cardiovascular: Tachycardic, irregularly irregular rhythm.  No murmurs, rubs, or gallops. Respiratory: Normal respiratory effort without tachypnea nor retractions. Breath sounds are clear and equal bilaterally. No wheezes/rales/rhonchi. Gastrointestinal: Soft and nontender. No distention. There is no CVA tenderness. Genitourinary:  Deferred Musculoskeletal: Normal range of motion in all extremities. No joint effusions.  1+ bilateral lower extremity edema Neurologic:  Normal speech and language. No gross focal neurologic deficits are appreciated.  Skin:  Skin is warm, dry and intact. No rash noted. Psychiatric: Mood and affect are normal.  Speech and behavior are normal. Patient exhibits appropriate insight and judgment.  ____________________________________________    LABS (pertinent positives/negatives)  Labs Reviewed  COMPREHENSIVE METABOLIC PANEL - Abnormal; Notable for the following:    Sodium 133 (*)    Glucose, Bld 108 (*)    All other components within normal limits  CBC WITH DIFFERENTIAL/PLATELET - Abnormal; Notable for the following:    Neutro Abs 8.2 (*)    Lymphs Abs 0.4 (*)    All other components within normal limits  URINALYSIS COMPLETEWITH MICROSCOPIC ([facility] ONLY) - Abnormal; Notable for the following:    Color, Urine YELLOW (*)    APPearance CLEAR (*)    Ketones, ur TRACE (*)    Protein, ur 30 (*)    Squamous Epithelial / LPF 0-5 (*)    All other components within normal limits  CULTURE, BLOOD (ROUTINE X 2)  CULTURE, BLOOD (ROUTINE X 2)  URINE CULTURE  CULTURE, EXPECTORATED SPUTUM-ASSESSMENT  LACTIC ACID, PLASMA  TROPONIN I  LACTIC ACID, PLASMA  HEMOGLOBIN A1C     ____________________________________________   EKG  None  ____________________________________________    RADIOLOGY  CXR IMPRESSION: No active disease.   ____________________________________________   PROCEDURES  Procedure(s) performed: None  Critical Care performed: No  ____________________________________________   INITIAL IMPRESSION / ASSESSMENT AND PLAN / ED COURSE  Pertinent labs & imaging results that were available during my care of the patient were reviewed by me and considered in my medical decision making (see chart for details).  Patient presented to the emergency department today  because of concerns for continued cough and shortness of breath. Patient's initial vital signs concerning for infection given febrile, tachycardia and tachypnea. Patient was written for broad-spectrum IV fluids and antibiotics. Blood work came back with no leukocytosis or elevated lactic acid.  ____________________________________________   FINAL CLINICAL IMPRESSION(S) / ED DIAGNOSES  Final diagnoses:  Fever, unspecified fever cause  Atrial fibrillation with RVR ([place])  Cough     Nance Pear, MD 11/04/15 1341

## 2015-11-03 NOTE — H&P (Addendum)
[facility] [place] NAME: Rachael Horne    MR#:  [id number]  DATE OF BIRTH:  [birth year]  DATE OF ADMISSION:  11/03/2015  PRIMARY CARE PHYSICIAN: SPARKS,JEFFREY D, MD   REQUESTING/REFERRING PHYSICIAN:   CHIEF COMPLAINT:   Chief Complaint  Patient presents with  . Cough  . Fever  . Hypertension    HISTORY OF PRESENT ILLNESS: Rachael Horne  is a 81 y.o. female with a known history of coronary artery disease, hypertension, status post ICD placement, stroke, peripheral vascular disease, congestive heart failure, obstructive sleep apnea who presents to the hospital with complaints of shortness of breath and cough worsening over a period of one week. Patient is coughing up yellow-looking phlegm. In emergency room, she was noted to be febrile to 102, she was also noted to be hypoxic with O2 sats of 88% on room air and tachypneic. She was in atrial fibrillation at rate of 150 and severely hypertensive with systolic blood pressure around 170.  Chest x-ray however did not show any pneumonia. Labs revealed neutrophilia, hyponatremia. Exam showed bilateral rales and wheezes as well as lower extremity swelling, hospitalist services were contacted for admission. Patient admits of tightness in the chest intermittently as well as nausea and feeling presyncopal earlier today.      PAST MEDICAL HISTORY:   Past Medical History  Diagnosis Date  . Diabetes mellitus without complication ([place])   . Hypertension   . Hyperlipemia   . Coronary artery disease   . Stroke ([place])   . DVT (deep venous thrombosis) ([place])   . Cancer ([place])     BL breast    PAST SURGICAL HISTORY:  Past Surgical History  Procedure Laterality Date  . Appendectomy    . Tonsillectomy    . Cholecystectomy    . Breast lumpectomy      removal of CA    SOCIAL HISTORY:  Social History  Substance Use Topics  . Smoking status: Never Smoker   . Smokeless tobacco: Never Used  .  Alcohol Use: No    FAMILY HISTORY: No family history on file.  DRUG ALLERGIES:  Allergies  Allergen Reactions  . Amoxicillin Other (See Comments)    Review of Systems  Constitutional: Positive for fever, chills and malaise/fatigue. Negative for weight loss.  HENT: Negative for congestion.   Eyes: Negative for blurred vision and double vision.  Respiratory: Positive for cough, sputum production, shortness of breath and wheezing.   Cardiovascular: Positive for chest pain, leg swelling and PND. Negative for palpitations and orthopnea.  Gastrointestinal: Positive for nausea and vomiting. Negative for abdominal pain, diarrhea, constipation, blood in stool and melena.  Genitourinary: Negative for dysuria, urgency, frequency and hematuria.  Musculoskeletal: Negative for falls.  Skin: Negative for rash.  Neurological: Positive for weakness. Negative for dizziness.  Psychiatric/Behavioral: Negative for depression and memory loss. The patient is not nervous/anxious.     MEDICATIONS AT HOME:  Prior to Admission medications   Medication Sig Start Date End Date Taking? Authorizing Provider  acetaminophen (TYLENOL) 500 MG tablet Take 1,000 mg by mouth 2 (two) times daily as needed for mild pain.   Yes Historical Provider, MD  albuterol (PROVENTIL HFA;VENTOLIN HFA) 108 (90 BASE) MCG/ACT inhaler Inhale 2 puffs into the lungs every 6 (six) hours as needed for wheezing or shortness of breath.   Yes Historical Provider, MD  ALPRAZolam Duanne Moron) 0.25 MG tablet Take 0.125 mg by mouth at bedtime as needed for anxiety or  sleep.   Yes Historical Provider, MD  anastrozole (ARIMIDEX) 1 MG tablet Take 1 mg by mouth daily.   Yes Historical Provider, MD  budesonide-formoterol (SYMBICORT) 160-4.5 MCG/ACT inhaler Inhale 2 puffs into the lungs 2 (two) times daily.   Yes Historical Provider, MD  CALCIUM-VITAMIN D-VITAMIN K PO Take 1 tablet by mouth 2 (two) times daily.   Yes Historical Provider, MD  dabigatran  (PRADAXA) 150 MG CAPS capsule Take 150 mg by mouth 2 (two) times daily.   Yes Historical Provider, MD  diltiazem (DILACOR XR) 240 MG 24 hr capsule Take 240 mg by mouth daily.   Yes Historical Provider, MD  diphenhydramine-acetaminophen (TYLENOL PM) 25-500 MG TABS Take 1 tablet by mouth at bedtime as needed (for sleep).   Yes Historical Provider, MD  furosemide (LASIX) 40 MG tablet Take 40 mg by mouth daily at 12 noon.   Yes Historical Provider, MD  glimepiride (AMARYL) 4 MG tablet Take 4 mg by mouth daily.   Yes Historical Provider, MD  losartan (COZAAR) 50 MG tablet Take 50 mg by mouth daily.   Yes Historical Provider, MD  magnesium oxide (MAG-OX) 400 MG tablet Take 400 mg by mouth daily at 12 noon.   Yes Historical Provider, MD  Multiple Vitamin (MULTIVITAMIN WITH MINERALS) TABS tablet Take 1 tablet by mouth daily at 12 noon.   Yes Historical Provider, MD  pantoprazole (PROTONIX) 40 MG tablet Take 40 mg by mouth daily.   Yes Historical Provider, MD  Polyethyl Glycol-Propyl Glycol (SYSTANE OP) Apply 1 drop to eye 4 (four) times daily as needed (for dry eyes).   Yes Historical Provider, MD  potassium chloride (K-DUR) 10 MEQ tablet Take 10 mEq by mouth daily at 12 noon.   Yes Historical Provider, MD      PHYSICAL EXAMINATION:   VITAL SIGNS: Blood pressure 171/71, pulse 91, temperature 99.7 F (37.6 C), temperature source Oral, resp. rate 16, height 4\' 11"  (1.499 m), weight 67.132 kg (148 lb), SpO2 93 %.  GENERAL:  81 y.o.-year-old patient sitting on the edge of the bed in moderate respiratory distress.  EYES: Pupils equal, round, reactive to light and accommodation. No scleral icterus. Extraocular muscles intact.  HEENT: Head atraumatic, normocephalic. Oropharynx and nasopharynx clear.  NECK:  Supple, no jugular venous distention. No thyroid enlargement, no tenderness.  LUNGS: Some diminished breath sounds bilaterally, bilateral wheezing, rales,rhonchi and crepitations. Intermittently using  accessory muscles of respiration, especially on exertion, trying to sit up upright due to shortness of breath.  CARDIOVASCULAR: S1, S2 , tachycardic, distant, difficult to hear due to adventitial sounds in the lungs. Not able to appreciate murmurs, rubs, or gallops.  ABDOMEN: Soft, nontender, nondistended. Bowel sounds present. No organomegaly or mass.  EXTREMITIES: 2+ bilateral lower extremity and pedal edema, no cyanosis, or clubbing.  NEUROLOGIC: Cranial nerves II through XII are intact. Muscle strength 5/5 in all extremities. Sensation intact. Gait not checked.  PSYCHIATRIC: The patient is alert and oriented x 3.  SKIN: No obvious rash, lesion, or ulcer.   LABORATORY PANEL:   CBC  Recent Labs Lab 11/03/15 0911  WBC 9.3  HGB 13.3  HCT 40.2  PLT 197  MCV 94.5  MCH 31.2  MCHC 33.0  RDW 14.4  LYMPHSABS 0.4*  MONOABS 0.7  EOSABS 0.0  BASOSABS 0.1   ------------------------------------------------------------------------------------------------------------------  Chemistries   Recent Labs Lab 11/03/15 0911  NA 133*  K 4.2  CL 101  CO2 24  GLUCOSE 108*  BUN 12  CREATININE 0.76  CALCIUM 9.1  AST 18  ALT 15  ALKPHOS 59  BILITOT 0.4   ------------------------------------------------------------------------------------------------------------------  Cardiac Enzymes  Recent Labs Lab 11/03/15 0911  TROPONINI <0.03   ------------------------------------------------------------------------------------------------------------------  RADIOLOGY: Dg Chest Port 1 View  11/03/2015  CLINICAL DATA:  Cough and fever EXAM: PORTABLE CHEST 1 VIEW COMPARISON:  08/05/2015 FINDINGS: Cardiac enlargement. Negative for heart failure. Negative for infiltrate or effusion. IMPRESSION: No active disease. Electronically Signed   By: Franchot Gallo M.D.   On: 11/03/2015 10:15    EKG: Orders placed or performed during the hospital encounter of 08/05/15  . EKG 12-Lead  . EKG 12-Lead  .  ED EKG within 10 minutes  . ED EKG within 10 minutes  . EKG    IMPRESSION AND PLAN:  Active Problems:   Sepsis ([place])   Acute bronchitis   Neutrophilia   Fever   Malignant essential hypertension   Atrial fibrillation with RVR (H[facility])   Hyponatremia 1. Sepsis due to acute bronchitis. Admit patient to medical floor initiated her on Rocephin as well as Zithromax pending sputum cultures, blood cultures 2. Acute bronchitis. No sputum cultures. Continue antibiotics. Adjust antibiotics depending on culture results 3. Neutrophilia. Follow with therapy 4. COPD exacerbation. Initiate patient on steroids. Continue nebulizer therapy 5. Acute on chronic respiratory failure with hypoxia. Continue oxygen therapy per nasal cannula, rales, keeping pulse oximeter is around 92-94% 6, , A. fib, RVR, resume outpatient medications advance them as needed . Continue Pradaxa 7. Malignant essential hypertension with fluid overload , continue outpatient medications. Add Lasix intravenously, nitroglycerin topically , follow ins and outs as well as weight  8. Acute and chronic diastolic CHF, continue diuretics intravenously as well as nitroglycerin topically as well as Cozaar    All the records are reviewed and case discussed with ED provider. Management plans discussed with the patient, family and they are in agreement.  CODE STATUS: Advance Directive Documentation        Most Recent Value   Type of Advance Directive  Living will, Healthcare Power of Attorney   Pre-existing out of facility DNR order (yellow form or pink MOST form)     "MOST" Form in Place?         TOTAL  CRITICAL CARETIME TAKING CARE OF THIS PATIENT: 60 minutes .   , coordination of care time 10 to 15 minutes in discussion with patient's son and power of attorney for medical care  Ruston Fedora M.D on 11/03/2015 at 12:07 PM  Between 7am to 6pm - Pager - [phone] After 6pm go to [email or web address] - password [email or web address] [facility]  Office  [phone]  CC: Primary care physician; Idelle Crouch, MD

## 2015-11-03 NOTE — ED Notes (Signed)
C/o cough past week, has increasing weakness, some fever

## 2015-11-03 NOTE — ED Notes (Signed)
[id number] mark  Pt son

## 2015-11-04 ENCOUNTER — Encounter
Admission: RE | Admit: 2015-11-04 | Discharge: 2015-11-04 | Disposition: A | Discharge status: Home / Self Care | Payer: Medicare Other | Source: Ambulatory Visit | Attending: Internal Medicine | Admitting: Internal Medicine

## 2015-11-04 DIAGNOSIS — E871 Hypo-osmolality and hyponatremia: Secondary | ICD-10-CM | POA: Insufficient documentation

## 2015-11-04 DIAGNOSIS — E119 Type 2 diabetes mellitus without complications: Secondary | ICD-10-CM | POA: Insufficient documentation

## 2015-11-04 DIAGNOSIS — D72829 Elevated white blood cell count, unspecified: Secondary | ICD-10-CM | POA: Insufficient documentation

## 2015-11-04 LAB — CBC
HCT: 39.9 % (ref 35.0–47.0)
Hemoglobin: 12.9 g/dL (ref 12.0–16.0)
MCH: 30.7 pg (ref 26.0–34.0)
MCHC: 32.2 g/dL (ref 32.0–36.0)
MCV: 95.4 fL (ref 80.0–100.0)
PLATELETS: 195 10*3/uL (ref 150–440)
RBC: 4.19 MIL/uL (ref 3.80–5.20)
RDW: 14.5 % (ref 11.5–14.5)
WBC: 11.8 10*3/uL — AB (ref 3.6–11.0)

## 2015-11-04 LAB — BASIC METABOLIC PANEL
Anion gap: 8 (ref 5–15)
BUN: 18 mg/dL (ref 6–20)
CALCIUM: 8.7 mg/dL — AB (ref 8.9–10.3)
CO2: 25 mmol/L (ref 22–32)
CREATININE: 1.03 mg/dL — AB (ref 0.44–1.00)
Chloride: 101 mmol/L (ref 101–111)
GFR calc non Af Amer: 48 mL/min — ABNORMAL LOW (ref >60)
GFR, EST AFRICAN AMERICAN: 56 mL/min — AB (ref >60)
Glucose, Bld: 188 mg/dL — ABNORMAL HIGH (ref 65–99)
Potassium: 3.9 mmol/L (ref 3.5–5.1)
SODIUM: 134 mmol/L — AB (ref 135–145)

## 2015-11-04 LAB — GLUCOSE, CAPILLARY
GLUCOSE-CAPILLARY: 168 mg/dL — AB (ref 65–99)
GLUCOSE-CAPILLARY: 125 mg/dL — AB (ref 65–99)
GLUCOSE-CAPILLARY: 228 mg/dL — AB (ref 65–99)
Glucose-Capillary: 216 mg/dL — ABNORMAL HIGH (ref 65–99)

## 2015-11-04 LAB — HEMOGLOBIN A1C: Hgb A1c MFr Bld: 6.2 % — ABNORMAL HIGH (ref 4.0–6.0)

## 2015-11-04 MED ORDER — FUROSEMIDE 40 MG PO TABS
40.0000 mg | ORAL_TABLET | Freq: Every day | ORAL | Status: DC
  Administered 2015-11-04 – 2015-11-07 (×4): 40 mg via ORAL
  Filled 2015-11-04 (×5): qty 1

## 2015-11-04 MED ORDER — CALCIUM-VITAMIN D-VITAMIN K 500-100-40 MG-UNT-MCG PO CHEW: CHEWABLE_TABLET | Freq: Two times a day (BID) | ORAL | Status: DC

## 2015-11-04 MED ORDER — CALCIUM CARBONATE-VITAMIN D 500-200 MG-UNIT PO TABS
1.0000 | ORAL_TABLET | Freq: Two times a day (BID) | ORAL | Status: DC
  Administered 2015-11-04 – 2015-11-07 (×7): 1 via ORAL
  Filled 2015-11-04 (×7): qty 1

## 2015-11-04 NOTE — NC FL2 (Signed)
[place] LEVEL OF CARE SCREENING TOOL     IDENTIFICATION  Patient Name: Rachael Horne Birthdate: 15-Feb-1928 Sex: female Admission Date (Current Location): 11/03/2015  [place] and [place] Number:  Set designer)   Facility and Address:  [facility], [address], [place], [place] [address]      Provider Number: [id number]  Attending Physician Name and Address:  Bettey Costa, MD  Relative Name and Phone Number:       Current Level of Care: Hospital Recommended Level of Care: [place] Prior Approval Number:    Date Approved/Denied:   PASRR Number:  ([id number])  Discharge Plan: SNF    Current Diagnoses: Patient Active Problem List   Diagnosis Date Noted  . Sepsis ([place]) 11/03/2015  . Acute bronchitis 11/03/2015  . Neutrophilia 11/03/2015  . Fever 11/03/2015  . Malignant essential hypertension 11/03/2015  . Atrial fibrillation with RVR ([place]) 11/03/2015  . Hyponatremia 11/03/2015  . A-fib ([place]) 08/22/2015  . Breast CA ([place]) 08/22/2015  . Diabetes mellitus, type 2 ([place]) 08/22/2015  . Allergy to environmental factors 08/22/2015  . History of knee problem 08/22/2015  . HLD (hyperlipidemia) 08/22/2015  . BP (high blood pressure) 08/22/2015  . MI (mitral incompetence) 08/22/2015  . Arthritis of hand, degenerative 08/22/2015  . TI (tricuspid incompetence) 08/22/2015  . Bradycardia 08/23/2014  . Arteriosclerosis of coronary artery 08/23/2014  . History of asthma 08/23/2014  . H/O deep venous thrombosis 08/23/2014  . Arthritis, degenerative 08/23/2014  . Adiposity 08/23/2014  . Temporary cerebral vascular dysfunction 08/23/2014  . Awareness of heartbeats 08/23/2014  . Absolute anemia 05/06/2014  . Cerebrovascular accident, old 07/03/1997    Orientation ACTIVITIES/SOCIAL BLADDER RESPIRATION    Self, Time, Situation, Place  Active Continent Normal  BEHAVIORAL SYMPTOMS/MOOD NEUROLOGICAL BOWEL NUTRITION STATUS     Continent Diet (2 gram sodium and mechanical soft)  PHYSICIAN VISITS COMMUNICATION OF NEEDS Height & Weight Skin    Verbally 4\' 11"  (149.9 cm) 148 lbs. Normal          AMBULATORY STATUS RESPIRATION    Assist extensive Normal      Personal Care Assistance Level of Assistance  Bathing, Feeding, Dressing Bathing Assistance: Maximum assistance Feeding assistance: Independent Dressing Assistance: Maximum assistance      Functional Limitations Info  Sight, Hearing, Speech Sight Info: Adequate Hearing Info: Adequate Speech Info: Adequate       SPECIAL CARE FACTORS FREQUENCY  PT (By licensed PT)                   Additional Factors Info  Allergies   Allergies Info:  (Amoxicillin)           Current Medications (11/04/2015):  This is the current hospital active medication list Current Facility-Administered Medications  Medication Dose Route Frequency Provider Last Rate Last Dose  . acetaminophen (TYLENOL) tablet 650 mg  650 mg Oral Q6H PRN Theodoro Grist, MD   650 mg at 11/03/15 1849   Or  . acetaminophen (TYLENOL) suppository 650 mg  650 mg Rectal Q6H PRN Theodoro Grist, MD      . acetaminophen (TYLENOL) tablet 1,000 mg  1,000 mg Oral BID PRN Theodoro Grist, MD   1,000 mg at 11/04/15 0501  . albuterol (PROVENTIL) (2.5 MG/3ML) 0.083% nebulizer solution 2.5 mg  2.5 mg Inhalation Q6H PRN Theodoro Grist, MD   2.5 mg at 11/04/15 0221  . ALPRAZolam (XANAX) tablet 0.125 mg  0.125 mg Oral QHS PRN Theodoro Grist, MD  0.125 mg at 11/03/15 2144  . anastrozole (ARIMIDEX) tablet 1 mg  1 mg Oral Daily Theodoro Grist, MD   1 mg at 11/04/15 0846  . artificial tears ophthalmic ointment 1 application  1 application Both Eyes QID PRN Theodoro Grist, MD      . azithromycin (ZITHROMAX) 500 mg in dextrose 5 % 250 mL IVPB  500 mg Intravenous Q24H Ramond Dial, RPH   500 mg at 11/04/15 0845  . budesonide-formoterol (SYMBICORT) 160-4.5 MCG/ACT inhaler 2 puff  2 puff Inhalation BID Theodoro Grist, MD   2 puff at 11/04/15 0844  . calcium-vitamin D (OSCAL WITH D) 500-200 MG-UNIT per tablet 1 tablet  1 tablet Oral BID Bettey Costa, MD   1 tablet at 11/04/15 0846  . cefTRIAXone (ROCEPHIN) 1 g in dextrose 5 % 50 mL IVPB  1 g Intravenous Q24H Ramond Dial, RPH   1 g at 11/04/15 0845  . chlorpheniramine-HYDROcodone (TUSSIONEX) 10-8 MG/5ML suspension 5 mL  5 mL Oral Q12H Epifanio Lesches, MD   5 mL at 11/04/15 0844  . dabigatran (PRADAXA) capsule 150 mg  150 mg Oral BID Theodoro Grist, MD   150 mg at 11/04/15 0846  . diltiazem (DILACOR XR) 24 hr capsule 240 mg  240 mg Oral Daily Theodoro Grist, MD   240 mg at 11/04/15 0846  . furosemide (LASIX) tablet 40 mg  40 mg Oral Q1200 Bettey Costa, MD   40 mg at 11/04/15 1217  . glimepiride (AMARYL) tablet 4 mg  4 mg Oral Daily Theodoro Grist, MD   4 mg at 11/04/15 1000  . insulin aspart (novoLOG) injection 0-5 Units  0-5 Units Subcutaneous QHS Theodoro Grist, MD   0 Units at 11/03/15 2151  . insulin aspart (novoLOG) injection 0-9 Units  0-9 Units Subcutaneous TID WC Theodoro Grist, MD   2 Units at 11/04/15 1200  . insulin aspart (novoLOG) injection 4 Units  4 Units Subcutaneous TID WC Theodoro Grist, MD   4 Units at 11/04/15 1200  . losartan (COZAAR) tablet 50 mg  50 mg Oral Daily Theodoro Grist, MD   50 mg at 11/04/15 0846  . magnesium oxide (MAG-OX) tablet 400 mg  400 mg Oral Q1200 Theodoro Grist, MD   400 mg at 11/04/15 1217  . methylPREDNISolone sodium succinate (SOLU-MEDROL) 125 mg/2 mL injection 60 mg  60 mg Intravenous Q24H Theodoro Grist, MD   60 mg at 11/04/15 1217  . ondansetron (ZOFRAN) tablet 4 mg  4 mg Oral Q6H PRN Theodoro Grist, MD       Or  . ondansetron (ZOFRAN) injection 4 mg  4 mg Intravenous Q6H PRN Theodoro Grist, MD      . pantoprazole (PROTONIX) EC tablet 40 mg  40 mg Oral QAC breakfast Theodoro Grist, MD   40 mg at 11/04/15 0846  . potassium chloride SA (K-DUR,KLOR-CON) CR tablet 20 mEq  20 mEq Oral Daily Theodoro Grist, MD   20 mEq  at 11/04/15 0846  . sodium chloride 0.9 % injection 3 mL  3 mL Intravenous Q12H Theodoro Grist, MD   3 mL at 11/04/15 1000  . zolpidem (AMBIEN) tablet 5 mg  5 mg Oral QHS PRN Theodoro Grist, MD         Discharge Medications: Please see discharge summary for a list of discharge medications.  Relevant Imaging Results:  Relevant Lab Results:  Recent Labs    Additional Information  (SS: [id number])  Maurine Cane, LCSW

## 2015-11-04 NOTE — Progress Notes (Signed)
A fib. 2 L of oxygen. Pt reports no pain. Up with PT and tolerated it well. FS are stable. Takes meds ok. A & O. Up to [facility] with standby. IV abx. Pt has no further concerns at this time.

## 2015-11-04 NOTE — Evaluation (Signed)
Physical Therapy Evaluation Patient Details Name: Rachael Horne MRN: [id number] DOB: 03-25-1928 Today's Date: 11/04/2015   History of Present Illness  Pt is an 81 y/o female that presents with cough and shortness of breath x 1 week. She was ntoed to be in A-fib upon arrival to ED, however she has been in NSR. Patient reports she had a slight bit of chest pain earlier, but denies any current symptoms.   Clinical Impression  Patient is typically quite active at her baseline, though has had a cough for roughly 1 week now which has limited her activity level. As such she demonstrates increased fatigue/deconditioning with ambulation. She is able to perform bed mobility and transfers without significant assistance, though she cannot tolerate household distances secondary to deconditioning. Patient demonstrates very small steps/slow gait pattern and presents as a very high falls risk. Skilled PT services are indicated to increase her endurance/safety with mobility.     Follow Up Recommendations SNF    Equipment Recommendations       Recommendations for Other Services       Precautions / Restrictions Precautions Precautions: Fall Restrictions Weight Bearing Restrictions: No      Mobility  Bed Mobility Overal bed mobility: Needs Assistance Bed Mobility: Supine to Sit;Sit to Supine     Supine to sit: Min guard Sit to supine: Min guard   General bed mobility comments: Patient requires minimal guarding/assistance to manage LEs on/off the edge of the bed.   Transfers Overall transfer level: Needs assistance Equipment used: Rolling walker (2 wheeled) Transfers: Sit to/from Stand Sit to Stand: Min guard         General transfer comment: Patient demonstrates appropriate speed and balance through transfer with no cuing required.   Ambulation/Gait Ambulation/Gait assistance: Min guard Ambulation Distance (Feet): 75 Feet Assistive device: Rolling walker (2 wheeled) Gait  Pattern/deviations: Step-through pattern;Decreased step length - left;Decreased step length - right;Trunk flexed   Gait velocity interpretation: <1.8 ft/sec, indicative of risk for recurrent falls General Gait Details: Patient demonstrates very slow gait pattern with minimal step lengths noted and several rest breaks for fatigue.   Stairs            Wheelchair Mobility    Modified Rankin (Stroke Patients Only)       Balance Overall balance assessment: Needs assistance   Sitting balance-Leahy Scale: Good Sitting balance - Comments: No balance deficits.    Standing balance support: Bilateral upper extremity supported Standing balance-Leahy Scale: Fair Standing balance comment: No loss of balance noted but her gait speed and step length indicate she is a very high falls risk.                              Pertinent Vitals/Pain Pain Assessment: No/denies pain    Home Living Family/patient expects to be discharged to:: Private residence Living Arrangements: Alone Available Help at Discharge:  (Son can check on her intemittently) Type of Home: House Home Access: Level entry     Home Layout: One level Home Equipment: Walker - 4 wheels      Prior Function Level of Independence: Independent with assistive device(s)         Comments: Pt denies any falls and uses a rollator walker at home for her baseline.      Hand Dominance        Extremity/Trunk Assessment   Upper Extremity Assessment: Overall WFL for tasks assessed  Lower Extremity Assessment: Overall WFL for tasks assessed         Communication   Communication: No difficulties  Cognition Arousal/Alertness: Awake/alert Behavior During Therapy: WFL for tasks assessed/performed Overall Cognitive Status: Within Functional Limits for tasks assessed                      General Comments      Exercises        Assessment/Plan    PT Assessment Patient needs  continued PT services  PT Diagnosis Difficulty walking;Generalized weakness   PT Problem List Decreased strength;Decreased activity tolerance;Decreased balance;Decreased mobility;Cardiopulmonary status limiting activity  PT Treatment Interventions Gait training;DME instruction;Therapeutic activities;Therapeutic exercise   PT Goals (Current goals can be found in the Care Plan section) Acute Rehab PT Goals Patient Stated Goal: To return home safely when appropriate.  PT Goal Formulation: With patient Time For Goal Achievement: 11/18/15 Potential to Achieve Goals: Good    Frequency Min 2X/week   Barriers to discharge Decreased caregiver support Patient lives alone and will require supervision/assistance for mobility given her deconditioning.     Co-evaluation               End of Session Equipment Utilized During Treatment: Gait belt Activity Tolerance: Patient tolerated treatment well Patient left: in bed;with call bell/phone within reach;with bed alarm set Nurse Communication: Mobility status         Time: [id number] PT Time Calculation (min) (ACUTE ONLY): 21 min   Charges:   PT Evaluation $Initial PT Evaluation Tier I: 1 Procedure     PT G Codes:       Kerman Passey, PT, DPT    11/04/2015, 4:49 PM

## 2015-11-04 NOTE — Care Management (Signed)
Patient lives alone and prior to this episode of illness, was independent in all of her adls.  She denies issues accessing medical care, obtaining meds or with transportation.  She sought treatment from her PCP upon onset of respiratory sx.  Started on [employer]. Patient says she is feeling extremely weak and can not hardly sit up on the side of the bed to eat meals.  Her 02 requirements are acute.  PT consult is pending.  She does not have access to substantial in home support.  If it is recommended that she needs skilled nursing, she is agreeable to consider.  She would also  agree to home health: suggested SN PT Aide if it is determined that she can discharge directly home.  Her son [name] [phone]) and her friend Minerva Areola share HCPOA responsibilities [phone]).  No agency preference

## 2015-11-04 NOTE — Consult Note (Signed)
ANTIBIOTIC CONSULT NOTE - FOLLOW UP  Pharmacy Consult for azithromycin/ceftriaxone Indication: sepsis due to acute bronchitis  Allergies  Allergen Reactions  . Amoxicillin Other (See Comments)   Patient Measurements: Height: 4\' 11"  (149.9 cm) Weight: 153 lb (69.4 kg) IBW/kg (Calculated) : 43.2  Vital Signs: Temp: 98.3 F (36.8 C) (12/02 1112) Temp Source: Oral (12/02 1112) BP: 148/53 mmHg (12/02 1112) Pulse Rate: 79 (12/02 1112)  Labs:  Recent Labs  11/03/15 0911 11/04/15 0431  WBC 9.3 11.8*  HGB 13.3 12.9  PLT 197 195  CREATININE 0.76 1.03*   Estimated Creatinine Clearance: 33.9 mL/min (by C-G formula based on Cr of 1.03).  Microbiology: Recent Results (from the past 720 hour(s))  Urine culture     Status: None (Preliminary result)   Collection Time: 11/03/15  9:11 AM  Result Value Ref Range Status   Specimen Description URINE, RANDOM  Final   Special Requests NONE  Final   Culture NO GROWTH 1 DAY  Final   Report Status PENDING  Incomplete   Assessment: Pharmacy consulted to dose azithromycin and ceftriaxone in this 81 year old female presenting with a 1.5 week history of cough and yellow phlegm.  In ED pt was febrile, sob, hypoxic, tachypneic, in afib and hypertensive. Patient was prescribed a z-pak on 11/29 and took two days of antibiotics with no improvement. Chest x-ray negative for PNA. Patient currently afebrile.  Blood and sputum cultures pending  Patient currently on day #2 azithromycin and CTX inpatient  Plan:  Follow up culture results Will continue azithromycin 500mg  daily and ceftriaxone 1g daily.  Pharmacy to continue to monitor  [facility] 11/04/2015,11:56 AM

## 2015-11-04 NOTE — Progress Notes (Signed)
[place] at [place] NAME: Rachael Horne    MR#:  [id number]  DATE OF BIRTH:  26-Jul-1928  SUBJECTIVE:  Patient reports she is feeling better than she did yesterday. She does not wear oxygen at home. She continues to have cough and some very mild shortness of breath.    REVIEW OF SYSTEMS:    Review of Systems  Constitutional: Negative for fever, chills and malaise/fatigue.  HENT: Negative for sore throat.   Eyes: Negative for blurred vision.  Respiratory: Positive for cough and shortness of breath. Negative for hemoptysis and wheezing.   Cardiovascular: Negative for chest pain, palpitations and leg swelling.  Gastrointestinal: Negative for nausea, vomiting, abdominal pain, diarrhea and blood in stool.  Genitourinary: Negative for dysuria.  Musculoskeletal: Negative for back pain.  Neurological: Negative for dizziness, tremors and headaches.  Endo/Heme/Allergies: Does not bruise/bleed easily.    Tolerating Diet: Yes      DRUG ALLERGIES:   Allergies  Allergen Reactions  . Amoxicillin Other (See Comments)    VITALS:  Blood pressure 148/53, pulse 79, temperature 98.3 F (36.8 C), temperature source Oral, resp. rate 20, height 4\' 11"  (1.499 m), weight 69.4 kg (153 lb), SpO2 95 %.  PHYSICAL EXAMINATION:   Physical Exam  Constitutional: She is oriented to person, place, and time and well-developed, well-nourished, and in no distress. No distress.  HENT:  Head: Normocephalic.  Eyes: No scleral icterus.  Neck: Normal range of motion. Neck supple. No JVD present. No tracheal deviation present.  Cardiovascular: Normal rate, regular rhythm and normal heart sounds.  Exam reveals no gallop and no friction rub.   No murmur heard. Pulmonary/Chest: Effort normal and breath sounds normal. No respiratory distress. She has no wheezes. She has no rales. She exhibits no tenderness.  Abdominal: Soft. Bowel sounds are normal. She  exhibits no distension and no mass. There is no tenderness. There is no rebound and no guarding.  Musculoskeletal: Normal range of motion. She exhibits no edema.  Neurological: She is alert and oriented to person, place, and time.  Skin: Skin is warm. No rash noted. No erythema.  Psychiatric: Affect and judgment normal.      LABORATORY PANEL:   CBC  Recent Labs Lab 11/04/15 0431  WBC 11.8*  HGB 12.9  HCT 39.9  PLT 195   ------------------------------------------------------------------------------------------------------------------  Chemistries   Recent Labs Lab 11/03/15 0911 11/04/15 0431  NA 133* 134*  K 4.2 3.9  CL 101 101  CO2 24 25  GLUCOSE 108* 188*  BUN 12 18  CREATININE 0.76 1.03*  CALCIUM 9.1 8.7*  AST 18  --   ALT 15  --   ALKPHOS 59  --   BILITOT 0.4  --    ------------------------------------------------------------------------------------------------------------------  Cardiac Enzymes  Recent Labs Lab 11/03/15 0911  TROPONINI <0.03   ------------------------------------------------------------------------------------------------------------------  RADIOLOGY:  Dg Chest Port 1 View  11/03/2015  CLINICAL DATA:  Cough and fever EXAM: PORTABLE CHEST 1 VIEW COMPARISON:  08/05/2015 FINDINGS: Cardiac enlargement. Negative for heart failure. Negative for infiltrate or effusion. IMPRESSION: No active disease. Electronically Signed   By: Franchot Gallo M.D.   On: 11/03/2015 10:15     ASSESSMENT AND PLAN:   81 year old female with a history of CAD, atrial fibrillation diabetes and moderate tricuspid and mitral valve regurgitation who presented with cough and shortness of breath.  1. Acute hypoxic respiratory failure: Chest x-ray on admission did not show evidence of pulmonary edema or pneumonia. She  was empirically started on IV Lasix along with Rocephin and azithromycin. Patient may have underlying pneumonia. Chest x-ray will be ordered for tomorrow  to see if patient does have pneumonia. I reviewed the last note from the cardiologist. Her recent echocardiogram showed normal ejection fraction with moderate tricuspid regurgitation and mitral regurgitation. At this time I do not feel patient is in congestive heart failure. I will change IV Lasix to her oral doses Lasix.   2. History of atrial fibrillation: Patient presented with RVR. Patient's heart rates have much improved. Continue diltiazem and paradox.  3. Diabetes: Continue Amaryl and sliding scale insulin.  4. Essential hypertension: Continue losartan and diltiazem.  5. History of breast cancer: Continue her Arimedex  6. Acute kidney insufficiency: Creatinine this morning is slightly elevated. Hold IV Lasix. Continue to monitor.   Physical Therapy consultation for disposition. Management plans discussed with the patient and she is in agreement.  CODE STATUS: full  TOTAL TIME TAKING CARE OF THIS PATIENT: 30 minutes.     POSSIBLE D/C 1-2 days, DEPENDING ON CLINICAL CONDITION.   Dhruvi Crenshaw M.D on 11/04/2015 at 1:04 PM  Between 7am to 6pm - Pager - [phone] After 6pm go to [email or web address] - password [email or web address] [facility]  Office  [phone]  CC: Primary care physician; Idelle Crouch, MD  Note: This dictation was prepared with Dragon dictation along with smaller phrase technology. Any transcriptional errors that result from this process are unintentional.

## 2015-11-04 NOTE — Clinical Social Work Note (Addendum)
Clinical Social Work Assessment  Patient Details  Name: Rachael Horne MRN: [id number] Date of Birth: 11-09-1928  Date of referral:  11/04/15               Reason for consult:  Facility Placement                Permission sought to share information with:  Facility Sport and exercise psychologist, Family Supports Permission granted to share information::  Yes, Verbal Permission Granted  Name::      (son [name]  [phone] )  Agency::     Relationship::     Contact Information:     Housing/Transportation Living arrangements for the past 2 months:  Apartment Source of Information:  Patient, Medical Team Patient Interpreter Needed:  None Criminal Activity/Legal Involvement Pertinent to Current Situation/Hospitalization:  No - Comment as needed Significant Relationships:  Adult Children, Friend, Other Family Members Lives with:  Self Do you feel safe going back to the place where you live?  Yes ("but not while I am weak") Need for family participation in patient care:  Yes (Comment)  Care giving concerns:  None at this time   Facilities manager / plan:  Holiday representative (CSW) consult for SNF placement, PT is recommending STR to assist with getting patient back to previous level of functioning.   Patient was alert, oriented and engaged in conversation with CSW.  CSW introduced self and explained role of CSW department.    Patient currently lives alone. She rides [employer] to all of her medical appointments. Her son [name] is HPOA and her support system. He has join POA with her friend Jackelyn Poling. CSW explained that PT is recommending rehab.  Patient never been to SNF. CSW reviewed SNF process with patient, Patient is agreeable to SNF search and prefers [facility] or [facility].       CSW will complete FL2, and fax out for available bed in anticipation of discharging to SNF for rehab.   Employment status:  Retired Nurse, adult PT Recommendations:  [place] / Referral to community resources:  [place]  Patient/Family's Response to care:  Patient was appreciative of information provided by CSW and will review list with son.   Patient/Family's Understanding of and Emotional Response to Diagnosis, Current Treatment, and Prognosis:  Patient understands that she is under continued medical work up at this time.  Once medically stable she would like to discharge home but is willing to go to SNF if she has to go.   Emotional Assessment Appearance:  Appears stated age Attitude/Demeanor/Rapport:    Affect (typically observed):  Accepting, Adaptable, Hopeful, Pleasant, Calm Orientation:  Oriented to Self, Oriented to Place, Oriented to  Time, Oriented to Situation Alcohol / Substance use:  Never Used Psych involvement (Current and /or in the community):  No (Comment)  Discharge Needs  Concerns to be addressed:  Care Coordination, Discharge Planning Concerns Readmission within the last 30 days:  No Current discharge risk:  Chronically ill, Dependent with Mobility, Lives alone Barriers to Discharge:  Continued Medical Work up   Maurine Cane, LCSW 11/04/2015, 5:00 PM

## 2015-11-05 ENCOUNTER — Inpatient Hospital Stay: Payer: Medicare Other

## 2015-11-05 LAB — BASIC METABOLIC PANEL WITH GFR
Anion gap: 10 (ref 5–15)
BUN: 32 mg/dL — ABNORMAL HIGH (ref 6–20)
CO2: 25 mmol/L (ref 22–32)
Calcium: 9.1 mg/dL (ref 8.9–10.3)
Chloride: 98 mmol/L — ABNORMAL LOW (ref 101–111)
Creatinine, Ser: 1.08 mg/dL — ABNORMAL HIGH (ref 0.44–1.00)
GFR calc Af Amer: 53 mL/min — ABNORMAL LOW
GFR calc non Af Amer: 45 mL/min — ABNORMAL LOW
Glucose, Bld: 166 mg/dL — ABNORMAL HIGH (ref 65–99)
Potassium: 4.2 mmol/L (ref 3.5–5.1)
Sodium: 133 mmol/L — ABNORMAL LOW (ref 135–145)

## 2015-11-05 LAB — CBC
HCT: 38.3 % (ref 35.0–47.0)
Hemoglobin: 12.4 g/dL (ref 12.0–16.0)
MCH: 30.7 pg (ref 26.0–34.0)
MCHC: 32.4 g/dL (ref 32.0–36.0)
MCV: 94.7 fL (ref 80.0–100.0)
Platelets: 206 10*3/uL (ref 150–440)
RBC: 4.05 MIL/uL (ref 3.80–5.20)
RDW: 14.2 % (ref 11.5–14.5)
WBC: 18.2 10*3/uL — ABNORMAL HIGH (ref 3.6–11.0)

## 2015-11-05 LAB — URINE CULTURE: CULTURE: NO GROWTH

## 2015-11-05 LAB — GLUCOSE, CAPILLARY
Glucose-Capillary: 265 mg/dL — ABNORMAL HIGH (ref 65–99)
Glucose-Capillary: 177 mg/dL — ABNORMAL HIGH (ref 65–99)

## 2015-11-05 MED ORDER — IPRATROPIUM-ALBUTEROL 0.5-2.5 (3) MG/3ML IN SOLN
3.0000 mL | Freq: Four times a day (QID) | RESPIRATORY_TRACT | Status: DC
  Administered 2015-11-05 – 2015-11-07 (×8): 3 mL via RESPIRATORY_TRACT
  Filled 2015-11-05 (×10): qty 3

## 2015-11-05 NOTE — Progress Notes (Signed)
[place] [facility] at [place] NAME: Rachael Horne    MR#:  [id number]  DATE OF BIRTH:  Oct 31, 1928  SUBJECTIVE:  Coughed all night. No sleep. Continues to feel tight and wheezy and short of breath.  REVIEW OF SYSTEMS:    Review of Systems  Constitutional: Negative for fever, chills and malaise/fatigue.  HENT: Negative for sore throat.   Eyes: Negative for blurred vision.  Respiratory: Positive for cough and shortness of breath. Negative for hemoptysis and wheezing.   Cardiovascular: Negative for chest pain, palpitations and leg swelling.  Gastrointestinal: Negative for nausea, vomiting, abdominal pain, diarrhea and blood in stool.  Genitourinary: Negative for dysuria.  Musculoskeletal: Negative for back pain.  Neurological: Negative for dizziness, tremors and headaches.  Endo/Heme/Allergies: Does not bruise/bleed easily.   Tolerating Diet: Yes  DRUG ALLERGIES:   Allergies  Allergen Reactions  . Amoxicillin Other (See Comments)    VITALS:  Blood pressure 158/77, pulse 88, temperature 98.1 F (36.7 C), temperature source Oral, resp. rate 20, height 4\' 11"  (1.499 m), weight 68.402 kg (150 lb 12.8 oz), SpO2 96 %.  PHYSICAL EXAMINATION:   Physical Exam  Constitutional: She is oriented to person, place, and time and well-developed, well-nourished, and in no distress. No distress.  HENT:  Head: Normocephalic.  Eyes: No scleral icterus.  Neck: Normal range of motion. Neck supple. No JVD present. No tracheal deviation present.  Cardiovascular: Normal rate, regular rhythm and normal heart sounds.  Exam reveals no gallop and no friction rub.   No murmur heard. Pulmonary/Chest: She is in respiratory distress. She has wheezes. She has no rales. She exhibits no tenderness.  Abdominal: Soft. Bowel sounds are normal. She exhibits no distension and no mass. There is no tenderness. There is no rebound and no guarding.  Musculoskeletal: Normal  range of motion. She exhibits no edema.  Neurological: She is alert and oriented to person, place, and time.  Skin: Skin is warm. No rash noted. No erythema.  Psychiatric: Affect and judgment normal.      LABORATORY PANEL:   CBC  Recent Labs Lab 11/05/15 1115  WBC 18.2*  HGB 12.4  HCT 38.3  PLT 206   ------------------------------------------------------------------------------------------------------------------  Chemistries   Recent Labs Lab 11/03/15 0911  11/05/15 0518  NA 133*  < > 133*  K 4.2  < > 4.2  CL 101  < > 98*  CO2 24  < > 25  GLUCOSE 108*  < > 166*  BUN 12  < > 32*  CREATININE 0.76  < > 1.08*  CALCIUM 9.1  < > 9.1  AST 18  --   --   ALT 15  --   --   ALKPHOS 59  --   --   BILITOT 0.4  --   --   < > = values in this interval not displayed. ------------------------------------------------------------------------------------------------------------------  Cardiac Enzymes  Recent Labs Lab 11/03/15 0911  TROPONINI <0.03   ------------------------------------------------------------------------------------------------------------------  RADIOLOGY:  Dg Chest 1 View  11/05/2015  CLINICAL DATA:  Pneumonia EXAM: CHEST 1 VIEW COMPARISON:  Two days ago FINDINGS: Chronic cardiopericardial enlargement. Stable mediastinal and hilar contours. There is diffuse interstitial coarsening which is chronic based on chest x-ray from 2016 and chest CT from 2015. Subtle patchy density in the bilateral lungs, potential pneumonia in this patient with sepsis. IMPRESSION: Bronchitic markings with possible patchy bronchopneumonia. Electronically Signed   By: Monte Fantasia M.D.   On: 11/05/2015 08:03  ASSESSMENT AND PLAN:   81 year old female with a history of CAD, atrial fibrillation diabetes and moderate tricuspid and mitral valve regurgitation who presented with cough and shortness of breath.  1. Acute hypoxic respiratory failure:  - Chest x-ray today consistent  with bronchopneumonia - Continue Rocephin, and azithromycin, Solu-Medrol, Symbicort, DuoNeb's, Tussionex  2. History of atrial fibrillation: Patient presented with RVR. Patient's heart rates have much improved. Continue diltiazem and pradaxa  3. Diabetes: He went to 6.2. Good control in the outpatient setting Continue Amaryl and sliding scale insulin.  4. Essential hypertension: Continue losartan and diltiazem.  5. History of breast cancer: Continue her Arimedex  6. Acute kidney insufficiency: Creatinine this morning is slightly elevated. Agree due to steroids, decreased volume, continue to monitor  7 leukocytosis: Due to steroids  8. Fall: Patient on injured after fall this morning. Physical therapy has seen her and recommended skilled nursing facility. Social work is following for placement  Physical Therapy consultation for disposition. Management plans discussed with the patient and she is in agreement.  CODE STATUS: full  TOTAL TIME TAKING CARE OF THIS PATIENT: 30 minutes.     POSSIBLE D/C 1-2 days, DEPENDING ON CLINICAL CONDITION.   Myrtis Ser M.D on 11/05/2015 at 1:56 PM  Between 7am to 6pm - Pager - [phone] After 6pm go to [email or web address] - password [email or web address] [facility]  Office  [phone]  CC: Primary care physician; Idelle Crouch, MD

## 2015-11-05 NOTE — Progress Notes (Signed)
Pt aaox3 in chair with chair alarm on. Family member at bedside. Denies pain or discomfort at this moment

## 2015-11-05 NOTE — Progress Notes (Signed)
Dr Volanda Napoleon was notified of patients fall. No new orders given. Will continue to monitor pt.

## 2015-11-05 NOTE — Progress Notes (Signed)
Pt up in the chair watching TV.  No complaints of pain or discomfort.  Family member in the room with the pt.  Call bell within reach.Pt alert and oriented x4, no complaints of pain or discomfort.  Bed in low position, call bell within reach.  Bed alarms on and functionin  Will continue to monitor and do hourly rounding throughout the shiftg.

## 2015-11-05 NOTE — Progress Notes (Signed)
Pt found  on floor by CNA  AAOX3 . Complete assessments done. No s/s of skin damage, Vs stable , Pt AAOX3. Denies pain. Pt states she got up to the sink and slipped, but did not fall  hard on floor and was not hurting.Denies any need to call family members.

## 2015-11-06 LAB — CBC
HEMATOCRIT: 36.3 % (ref 35.0–47.0)
Hemoglobin: 11.9 g/dL — ABNORMAL LOW (ref 12.0–16.0)
MCH: 30.9 pg (ref 26.0–34.0)
MCHC: 32.8 g/dL (ref 32.0–36.0)
MCV: 94.2 fL (ref 80.0–100.0)
PLATELETS: 205 10*3/uL (ref 150–440)
RBC: 3.86 MIL/uL (ref 3.80–5.20)
RDW: 14.1 % (ref 11.5–14.5)
WBC: 15.8 10*3/uL — AB (ref 3.6–11.0)

## 2015-11-06 LAB — BASIC METABOLIC PANEL
ANION GAP: 7 (ref 5–15)
BUN: 34 mg/dL — ABNORMAL HIGH (ref 6–20)
CALCIUM: 9.1 mg/dL (ref 8.9–10.3)
CO2: 26 mmol/L (ref 22–32)
CREATININE: 0.96 mg/dL (ref 0.44–1.00)
Chloride: 98 mmol/L — ABNORMAL LOW (ref 101–111)
GFR, EST NON AFRICAN AMERICAN: 52 mL/min — AB (ref >60)
Glucose, Bld: 202 mg/dL — ABNORMAL HIGH (ref 65–99)
Potassium: 4.4 mmol/L (ref 3.5–5.1)
SODIUM: 131 mmol/L — AB (ref 135–145)

## 2015-11-06 LAB — GLUCOSE, CAPILLARY
GLUCOSE-CAPILLARY: 241 mg/dL — AB (ref 65–99)
GLUCOSE-CAPILLARY: 78 mg/dL (ref 65–99)
Glucose-Capillary: 155 mg/dL — ABNORMAL HIGH (ref 65–99)
Glucose-Capillary: 209 mg/dL — ABNORMAL HIGH (ref 65–99)

## 2015-11-06 MED ORDER — SALINE SPRAY 0.65 % NA SOLN
1.0000 | NASAL | Status: DC | PRN
  Administered 2015-11-06: 1 via NASAL
  Filled 2015-11-06: qty 44

## 2015-11-06 MED ORDER — PREDNISONE 50 MG PO TABS
50.0000 mg | ORAL_TABLET | Freq: Every day | ORAL | Status: DC
  Administered 2015-11-07: 50 mg via ORAL

## 2015-11-06 MED ORDER — POLYETHYLENE GLYCOL 3350 17 G PO PACK
17.0000 g | PACK | Freq: Every day | ORAL | Status: DC | PRN
  Administered 2015-11-06: 17 g via ORAL
  Filled 2015-11-06: qty 1

## 2015-11-06 MED ORDER — SENNOSIDES-DOCUSATE SODIUM 8.6-50 MG PO TABS
1.0000 | ORAL_TABLET | Freq: Two times a day (BID) | ORAL | Status: DC | PRN
  Administered 2015-11-06: 1 via ORAL
  Filled 2015-11-06: qty 1

## 2015-11-06 NOTE — Progress Notes (Signed)
Patient complaining of constipation, reports she has not had a BM since Thursday. No relief from prune juice cocktail this AM & scheduled stool softner. [facility] doc paged and RN reported pt normally takes miralax and senna-s. MD to place orders.

## 2015-11-06 NOTE — Progress Notes (Signed)
Pt up to chair for breakfast. Chair alarm on.

## 2015-11-06 NOTE — Care Management Important Message (Signed)
Important Message  Patient Details  Name: Rachael Horne MRN: [id number] Date of Birth: Jun 15, 1928   Medicare Important Message Given:  Yes    Zamyah Wiesman A, RN 11/06/2015, 5:43 PM

## 2015-11-06 NOTE — Progress Notes (Signed)
[facility] at [place] NAME: Rachael Horne    MR#:  [id number]  DATE OF BIRTH:  02-23-1928  SUBJECTIVE:  Feeling much better today. Less short of breath. Still very weak  REVIEW OF SYSTEMS:    Review of Systems  Constitutional: Negative for fever, chills and malaise/fatigue.  HENT: Negative for sore throat.   Eyes: Negative for blurred vision.  Respiratory: Positive for cough and shortness of breath. Negative for hemoptysis and wheezing.   Cardiovascular: Negative for chest pain, palpitations and leg swelling.  Gastrointestinal: Negative for nausea, vomiting, abdominal pain, diarrhea and blood in stool.  Genitourinary: Negative for dysuria.  Musculoskeletal: Negative for back pain.  Neurological: Negative for dizziness, tremors and headaches.  Endo/Heme/Allergies: Does not bruise/bleed easily.   Tolerating Diet: Yes  DRUG ALLERGIES:   Allergies  Allergen Reactions  . Amoxicillin Other (See Comments)    VITALS:  Blood pressure 175/61, pulse 116, temperature 98.3 F (36.8 C), temperature source Oral, resp. rate 18, height 4\' 11"  (1.499 m), weight 68.312 kg (150 lb 9.6 oz), SpO2 96 %.  PHYSICAL EXAMINATION:   Physical Exam  Constitutional: She is oriented to person, place, and time and well-developed, well-nourished, and in no distress. No distress.  HENT:  Head: Normocephalic.  Eyes: No scleral icterus.  Neck: Normal range of motion. Neck supple. No JVD present. No tracheal deviation present.  Cardiovascular: Normal rate, regular rhythm and normal heart sounds.  Exam reveals no gallop and no friction rub.   No murmur heard. Pulmonary/Chest: No respiratory distress. She has wheezes. She has no rales. She exhibits no tenderness.  Abdominal: Soft. Bowel sounds are normal. She exhibits no distension and no mass. There is no tenderness. There is no rebound and no guarding.  Musculoskeletal: Normal range of motion. She exhibits  no edema.  Neurological: She is alert and oriented to person, place, and time.  Skin: Skin is warm. No rash noted. No erythema.  Psychiatric: Affect and judgment normal.      LABORATORY PANEL:   CBC  Recent Labs Lab 11/06/15 0449  WBC 15.8*  HGB 11.9*  HCT 36.3  PLT 205   ------------------------------------------------------------------------------------------------------------------  Chemistries   Recent Labs Lab 11/03/15 0911  11/06/15 0449  NA 133*  < > 131*  K 4.2  < > 4.4  CL 101  < > 98*  CO2 24  < > 26  GLUCOSE 108*  < > 202*  BUN 12  < > 34*  CREATININE 0.76  < > 0.96  CALCIUM 9.1  < > 9.1  AST 18  --   --   ALT 15  --   --   ALKPHOS 59  --   --   BILITOT 0.4  --   --   < > = values in this interval not displayed. ------------------------------------------------------------------------------------------------------------------  Cardiac Enzymes  Recent Labs Lab 11/03/15 0911  TROPONINI <0.03   ------------------------------------------------------------------------------------------------------------------  RADIOLOGY:  Dg Chest 1 View  11/05/2015  CLINICAL DATA:  Pneumonia EXAM: CHEST 1 VIEW COMPARISON:  Two days ago FINDINGS: Chronic cardiopericardial enlargement. Stable mediastinal and hilar contours. There is diffuse interstitial coarsening which is chronic based on chest x-ray from 2016 and chest CT from 2015. Subtle patchy density in the bilateral lungs, potential pneumonia in this patient with sepsis. IMPRESSION: Bronchitic markings with possible patchy bronchopneumonia. Electronically Signed   By: Monte Fantasia M.D.   On: 11/05/2015 08:03     ASSESSMENT AND PLAN:  81 year old female with a history of CAD, atrial fibrillation diabetes and moderate tricuspid and mitral valve regurgitation who presented with cough and shortness of breath.  1. Acute hypoxic respiratory failure: Improved, no longer needing supplemental oxygen - Chest x-ray  today consistent with bronchopneumonia - Continue Rocephin, and azithromycin, de-escalate steroids, Symbicort, DuoNeb's, Tussionex  2. History of atrial fibrillation: Patient presented with RVR. Patient's heart rates have much improved. Continue diltiazem and pradaxa  3. Diabetes: A1c 6.2. Good control in the outpatient setting Continue Amaryl and sliding scale insulin.  4. Essential hypertension: Continue losartan and diltiazem.  5. History of breast cancer: Continue her Arimedex  6. Acute kidney insufficiency: Creatinine this morning is slightly elevated. Agree due to steroids, decreased volume, continue to monitor  7 leukocytosis: Due to steroids  8. Fall: Patient on injured after fall this morning. Physical therapy has seen her and recommended skilled nursing facility. Social work is following for placement. I would like physical therapy to evaluate this patient again as she seems to be doing much better and stronger on her feet today. She may be appropriate for home health physical therapy.  Physical Therapy consultation for disposition. Management plans discussed with the patient and she is in agreement.  CODE STATUS: full  TOTAL TIME TAKING CARE OF THIS PATIENT: 30 minutes.   POSSIBLE D/C 1-2 days, DEPENDING ON CLINICAL CONDITION.   Myrtis Ser M.D on 11/06/2015 at 4:09 PM  Between 7am to 6pm - Pager - [phone] After 6pm go to [email or web address] - password [email or web address] [facility]  Office  [phone]  CC: Primary care physician; Idelle Crouch, MD

## 2015-11-06 NOTE — Plan of Care (Signed)
Problem: Safety: Goal: Ability to remain free from injury will improve Outcome: Progressing Patient experienced a fall 12/3 (see prev. Documentation) with no injuries. Pt is on high falls precautions and has been educated on fall prevention and safety.

## 2015-11-06 NOTE — Progress Notes (Signed)
Ambulated patient this morning without O2. Spo2 93 and HR 106.

## 2015-11-07 LAB — GLUCOSE, CAPILLARY
Glucose-Capillary: 152 mg/dL — ABNORMAL HIGH (ref 65–99)
Glucose-Capillary: 36 mg/dL — CL (ref 65–99)
Glucose-Capillary: 71 mg/dL (ref 65–99)

## 2015-11-07 LAB — BASIC METABOLIC PANEL
ANION GAP: 5 (ref 5–15)
BUN: 29 mg/dL — ABNORMAL HIGH (ref 6–20)
CALCIUM: 8.9 mg/dL (ref 8.9–10.3)
CO2: 27 mmol/L (ref 22–32)
CREATININE: 0.85 mg/dL (ref 0.44–1.00)
Chloride: 98 mmol/L — ABNORMAL LOW (ref 101–111)
Glucose, Bld: 186 mg/dL — ABNORMAL HIGH (ref 65–99)
Potassium: 4.6 mmol/L (ref 3.5–5.1)
SODIUM: 130 mmol/L — AB (ref 135–145)

## 2015-11-07 LAB — CBC
HCT: 37.2 % (ref 35.0–47.0)
Hemoglobin: 12.4 g/dL (ref 12.0–16.0)
MCH: 31.4 pg (ref 26.0–34.0)
MCHC: 33.3 g/dL (ref 32.0–36.0)
MCV: 94.1 fL (ref 80.0–100.0)
PLATELETS: 207 10*3/uL (ref 150–440)
RBC: 3.95 MIL/uL (ref 3.80–5.20)
RDW: 14 % (ref 11.5–14.5)
WBC: 14.7 10*3/uL — AB (ref 3.6–11.0)

## 2015-11-07 MED ORDER — PREDNISONE 10 MG (21) PO TBPK: 10.0000 mg | ORAL_TABLET | Freq: Every day | ORAL | Status: DC

## 2015-11-07 MED ORDER — CEFUROXIME AXETIL 500 MG PO TABS: 500.0000 mg | ORAL_TABLET | Freq: Two times a day (BID) | ORAL | Status: DC

## 2015-11-07 MED ORDER — AZITHROMYCIN 250 MG PO TABS: ORAL_TABLET | ORAL | Status: AC

## 2015-11-07 MED ORDER — SENNOSIDES-DOCUSATE SODIUM 8.6-50 MG PO TABS: 1.0000 | ORAL_TABLET | Freq: Two times a day (BID) | ORAL | Status: AC | PRN

## 2015-11-07 MED ORDER — ALPRAZOLAM 0.25 MG PO TABS: 0.1250 mg | ORAL_TABLET | Freq: Every evening | ORAL | Status: DC | PRN

## 2015-11-07 MED ORDER — POLYETHYLENE GLYCOL 3350 17 G PO PACK: 17.0000 g | PACK | Freq: Every day | ORAL | Status: DC | PRN

## 2015-11-07 MED ORDER — BISACODYL 10 MG RE SUPP
10.0000 mg | Freq: Once | RECTAL | Status: AC
  Administered 2015-11-07: 10 mg via RECTAL
  Filled 2015-11-07: qty 1

## 2015-11-07 MED ORDER — HYDROCOD POLST-CPM POLST ER 10-8 MG/5ML PO SUER: 5.0000 mL | Freq: Two times a day (BID) | ORAL | Status: DC

## 2015-11-07 MED ORDER — AZITHROMYCIN 250 MG PO TABS: 500.0000 mg | ORAL_TABLET | Freq: Every day | ORAL | Status: DC

## 2015-11-07 MED ORDER — CEFUROXIME AXETIL 250 MG PO TABS: 500.0000 mg | ORAL_TABLET | Freq: Two times a day (BID) | ORAL | Status: DC

## 2015-11-07 MED ORDER — SALINE SPRAY 0.65 % NA SOLN: 1.0000 | NASAL | Status: AC | PRN

## 2015-11-07 NOTE — Progress Notes (Signed)
Physical Therapy Treatment Patient Details Name: Rachael Horne MRN: [id number] DOB: 01/30/1928 Today's Date: 11/07/2015    History of Present Illness Pt is an 81 y/o female that presents with cough and shortness of breath x 1 week. She was ntoed to be in A-fib upon arrival to ED, however she has been in NSR. Patient reports she had a slight bit of chest pain earlier, but denies any current symptoms.     PT Comments    Pt is getting up with help to BR but needs significant assistance to walk, to maneuver, to get OOB and in bed, and to maintain safety with her tendency to leave walker aside.  Her plan is SNF to increase strength and recover independence and safety.  Will anticipate dc in next 1-2 days.  Follow Up Recommendations  SNF     Equipment Recommendations  None recommended by PT    Recommendations for Other Services       Precautions / Restrictions Precautions Precautions: Fall Restrictions Weight Bearing Restrictions: No    Mobility  Bed Mobility Overal bed mobility: Needs Assistance Bed Mobility: Supine to Sit;Sit to Supine     Supine to sit: Min assist Sit to supine: Min assist   General bed mobility comments: using bed rail and dense cues for sequencing  Transfers Overall transfer level: Needs assistance Equipment used: Rolling walker (2 wheeled) Transfers: Sit to/from [facility] Sit to Stand: Min assist;Mod assist (from lower commode, min guard higher surfaces) Stand pivot transfers: Min guard       General transfer comment: cues for hand placement and seuqencing  Ambulation/Gait Ambulation/Gait assistance: Min guard;Min assist Ambulation Distance (Feet): 30 Feet Assistive device: Rolling walker (2 wheeled) Gait Pattern/deviations: Step-through pattern;Wide base of support;Trunk flexed (cues for direction and to stay with walker and not abandon i) Gait velocity: redcued Gait velocity interpretation: Below normal speed for  age/gender General Gait Details: tries to set walker aside all over her room   Stairs            Wheelchair Mobility    Modified Rankin (Stroke Patients Only)       Balance     Sitting balance-Leahy Scale: Good       Standing balance-Leahy Scale: Fair Standing balance comment: fair- dynamic                    Cognition Arousal/Alertness: Awake/alert Behavior During Therapy: WFL for tasks assessed/performed Overall Cognitive Status: Within Functional Limits for tasks assessed                      Exercises      General Comments        Pertinent Vitals/Pain Pain Assessment: No/denies pain    Home Living                      Prior Function            PT Goals (current goals can now be found in the care plan section) Acute Rehab PT Goals Patient Stated Goal: To return home safely when appropriate.     Frequency  Min 2X/week    PT Plan      Co-evaluation             End of Session Equipment Utilized During Treatment: Gait belt Activity Tolerance: Patient tolerated treatment well;Patient limited by fatigue Patient left: in bed;with call bell/phone within reach;with bed alarm set  Time: [id number] PT Time Calculation (min) (ACUTE ONLY): 33 min  Charges:  $Gait Training: 8-22 mins                    G Codes:      Ramond Dial 11/19/15, 2:29 PM   Mee Hives, PT MS Acute Rehab Dept. Number: [phone] [phone] and [place] [phone]

## 2015-11-07 NOTE — Progress Notes (Signed)
Spoke with Judson Roch, [place] regarding discharge to facility. Informed that patient has a bed at [facility]. MD orders for d/c have been placed, but she needs a discharge summary. Notified Dr. Volanda Napoleon - MD to work on now. No other needs at this time.

## 2015-11-07 NOTE — Progress Notes (Signed)
Dr. Volanda Napoleon notified of blood sugar drop. Pt eating snack now - asymptomatic. Will recheck.

## 2015-11-07 NOTE — Progress Notes (Signed)
Medications stored in our pharmacy were sent home with patient's son, [name], per patient request.

## 2015-11-07 NOTE — Clinical Social Work Note (Signed)
Pt is ready for discharge today and will go to [facility] this afternoon. Pt and son are aware and agreeable to discharge plan. RN to call report and EMS will provide transportation. CSW is signing off as no further needs identified.   Darden Dates, MSW, LCSW -Clinical Social Worker  -

## 2015-11-07 NOTE — Progress Notes (Signed)
Hypoglycemic Event  CBG: 36  Treatment: 15 GM carbohydrate snack  Symptoms: None  Follow-up CBG: Time:1635 CBG Result:71   Possible Reasons for Event: Inadequate meal intake  Comments/MD notified: Dr. Volanda Napoleon aware     Toniann Ket

## 2015-11-07 NOTE — Progress Notes (Signed)
Updated Dr. Volanda Napoleon about blood pressures. Instructed to proceed with discharge.

## 2015-11-07 NOTE — Progress Notes (Signed)
Report called to Engineer, materials at [place].

## 2015-11-07 NOTE — Progress Notes (Signed)
EMS called for transport.

## 2015-11-07 NOTE — Progress Notes (Signed)
Pt still constipated. Said nothing she took last night worked, requesting suppository. Per Dr. Volanda Napoleon, order one time dulcolax suppository.

## 2015-11-07 NOTE — Progress Notes (Signed)
Spoke with Rod Holler PT. Updated her that Dr. Volanda Napoleon would like re-eval prior to discharge - orders have already in place. Per Rod Holler, pt is next on list.

## 2015-11-07 NOTE — Care Management Note (Signed)
Case Management Note  Patient Details  Name: Rachael Horne MRN: [id number] Date of Birth: 12-Jul-1928  Subjective/Objective:       PT is recommending SNF. [name] is requiring assistance with all movements and transfers.  [name] is insisting upon discharge home. Dr Bridgett Larsson talked with husband at length and [name] continues to insist that he can manage [name] at home. Case management will continue to follow for discharge planning.              Action/Plan:   Expected Discharge Date:                  Expected Discharge Plan:     In-House Referral:     Discharge planning Services     Post Acute Care Choice:    Choice offered to:     DME Arranged:    DME Agency:     HH Arranged:    [place] Agency:     Status of Service:     Medicare Important Message Given:  Yes Date Medicare IM Given:    Medicare IM give by:    Date Additional Medicare IM Given:    Additional Medicare Important Message give by:     If discussed at [place] of Stay Meetings, dates discussed:    Additional Comments:  Denijah Karrer A, RN 11/07/2015, 6:04 PM

## 2015-11-07 NOTE — Discharge Summary (Signed)
[facility] at [place] NAME: Rachael Horne    MR#:  [id number]  DATE OF BIRTH:  Jun 12, 1928  DATE OF ADMISSION:  11/03/2015 ADMITTING PHYSICIAN: Theodoro Grist, MD  DATE OF DISCHARGE: 11/07/2015  PRIMARY CARE PHYSICIAN: SPARKS,JEFFREY D, MD    ADMISSION DIAGNOSIS:  Cough [R05] Atrial fibrillation with RVR ([place]) [I48.91] Fever, unspecified fever cause [R50.9]  DISCHARGE DIAGNOSIS:  Active Problems:   Sepsis ([place])   Acute bronchitis   Fever   Atrial fibrillation with RVR ([place])   SECONDARY DIAGNOSIS:   Past Medical History  Diagnosis Date  . Diabetes mellitus without complication ([place])   . Hypertension   . Hyperlipemia   . Coronary artery disease   . Stroke ([place])   . DVT (deep venous thrombosis) ([place])   . Cancer ([place])     BL breast    HOSPITAL COURSE:   81 year old female with a history of CAD, atrial fibrillation diabetes and moderate tricuspid and mitral valve regurgitation who presented with cough and shortness of breath.  1. Acute hypoxic respiratory failure due to bronchopneumonia.: Improved, no longer needing supplemental oxygen - Chest x-ray today consistent with bronchopneumonia. Cultures negative. Treated with Rocephin and azithromycin during hospitalization. Being discharged on azithromycin and Ceftin to complete a 10 day course. Also being discharged on prednisone taper and Symbicort. Also with Tussionex for cough control. She still becomes fairly short of breath with exertion.  2. History of atrial fibrillation: Patient presented with RVR. Patient's heart rate now controlled.Continue diltiazem and pradaxa  3. Diabetes: A1c 6.2. Good control in the outpatient setting. Continue home regimen. Could even consider D escalating as goal A1c in this age group is less than 8.  4. Essential hypertension: Controlled. Continue losartan and diltiazem.  5. History of breast cancer: Continue  Arimedex  6. Acute kidney insufficiency: Improved with gentle hydration. Renal function now back to baseline.   7 leukocytosis: Due to steroids. Decreasing as we decrease doses of steroids.  8. Fall: Patient had 1 fall during hospitalization on 12/3. She sustained a bruise on her right hand. Otherwise no other injuries. Physical therapy has recommended skilled nursing facility. I agree as she lives alone and will be unable to perform her ADLs without assistance.   DISCHARGE CONDITIONS:   Fair  CONSULTS OBTAINED:     None  DRUG ALLERGIES:   Allergies  Allergen Reactions  . Amoxicillin Other (See Comments)    DISCHARGE MEDICATIONS:   Current Discharge Medication List    START taking these medications   Details  azithromycin (ZITHROMAX) 250 MG tablet Take one tablet daily. Qty: 3 each, Refills: 0    cefUROXime (CEFTIN) 500 MG tablet Take 1 tablet (500 mg total) by mouth 2 (two) times daily with a meal. Qty: 6 tablet, Refills: 0    chlorpheniramine-HYDROcodone (TUSSIONEX) 10-8 MG/5ML SUER Take 5 mLs by mouth every 12 (twelve) hours. Qty: 140 mL, Refills: 0    polyethylene glycol (MIRALAX / GLYCOLAX) packet Take 17 g by mouth daily as needed for mild constipation or moderate constipation. Qty: 14 each, Refills: 0    predniSONE (STERAPRED UNI-PAK 21 TAB) 10 MG (21) TBPK tablet Take 1 tablet (10 mg total) by mouth daily. Take 60 mg on day one. Decrease by 10 mg daily and then stop. Qty: 21 tablet, Refills: 0    senna-docusate (SENOKOT-S) 8.6-50 MG tablet Take 1 tablet by mouth 2 (two) times daily as needed for mild constipation or  moderate constipation. Qty: 30 tablet, Refills: 0    sodium chloride (OCEAN) 0.65 % SOLN nasal spray Place 1 spray into both nostrils as needed for congestion. Qty: 1 Bottle, Refills: 0      CONTINUE these medications which have CHANGED   Details  ALPRAZolam (XANAX) 0.25 MG tablet Take 0.5 tablets (0.125 mg total) by mouth at bedtime as needed  for anxiety or sleep. Qty: 30 tablet, Refills: 0      CONTINUE these medications which have NOT CHANGED   Details  acetaminophen (TYLENOL) 500 MG tablet Take 1,000 mg by mouth 2 (two) times daily as needed for mild pain.    albuterol (PROVENTIL HFA;VENTOLIN HFA) 108 (90 BASE) MCG/ACT inhaler Inhale 2 puffs into the lungs every 6 (six) hours as needed for wheezing or shortness of breath.    anastrozole (ARIMIDEX) 1 MG tablet Take 1 mg by mouth daily.    budesonide-formoterol (SYMBICORT) 160-4.5 MCG/ACT inhaler Inhale 2 puffs into the lungs 2 (two) times daily.    CALCIUM-VITAMIN D-VITAMIN K PO Take 1 tablet by mouth 2 (two) times daily.    dabigatran (PRADAXA) 150 MG CAPS capsule Take 150 mg by mouth 2 (two) times daily.    diltiazem (DILACOR XR) 240 MG 24 hr capsule Take 240 mg by mouth daily.    diphenhydramine-acetaminophen (TYLENOL PM) 25-500 MG TABS Take 1 tablet by mouth at bedtime as needed (for sleep).    furosemide (LASIX) 40 MG tablet Take 40 mg by mouth daily at 12 noon.    glimepiride (AMARYL) 4 MG tablet Take 4 mg by mouth daily.    losartan (COZAAR) 50 MG tablet Take 50 mg by mouth daily.    magnesium oxide (MAG-OX) 400 MG tablet Take 400 mg by mouth daily at 12 noon.    Multiple Vitamin (MULTIVITAMIN WITH MINERALS) TABS tablet Take 1 tablet by mouth daily at 12 noon.    pantoprazole (PROTONIX) 40 MG tablet Take 40 mg by mouth daily.    Polyethyl Glycol-Propyl Glycol (SYSTANE OP) Apply 1 drop to eye 4 (four) times daily as needed (for dry eyes).    potassium chloride (K-DUR) 10 MEQ tablet Take 10 mEq by mouth daily at 12 noon.         DISCHARGE INSTRUCTIONS:    DIET:  Cardiac diet and Diabetic diet  DISCHARGE CONDITION:  Fair  ACTIVITY:  Activity as tolerated per physical therapy recommendations  OXYGEN:  Home Oxygen: No.   Oxygen Delivery: room air  DISCHARGE LOCATION:  nursing home   If you experience worsening of your admission symptoms,  develop shortness of breath, life threatening emergency, suicidal or homicidal thoughts you must seek medical attention immediately by calling 911 or calling your MD immediately  if symptoms less severe.  You Must read complete instructions/literature along with all the possible adverse reactions/side effects for all the Medicines you take and that have been prescribed to you. Take any new Medicines after you have completely understood and accept all the possible adverse reactions/side effects.   Please note  You were cared for by a hospitalist during your hospital stay. If you have any questions about your discharge medications or the care you received while you were in the hospital after you are discharged, you can call the unit and asked to speak with the hospitalist on call if the hospitalist that took care of you is not available. Once you are discharged, your primary care physician will handle any further medical issues. Please note that NO  REFILLS for any discharge medications will be authorized once you are discharged, as it is imperative that you return to your primary care physician (or establish a relationship with a primary care physician if you do not have one) for your aftercare needs so that they can reassess your need for medications and monitor your lab values.   Today   CHIEF COMPLAINT:   Chief Complaint  Patient presents with  . Cough  . Fever  . Hypertension    HISTORY OF PRESENT ILLNESS:   HISTORY OF PRESENT ILLNESS: Rachael Horne is a 81 y.o. female with a known history of coronary artery disease, hypertension, status post ICD placement, stroke, peripheral vascular disease, congestive heart failure, obstructive sleep apnea who presents to the hospital with complaints of shortness of breath and cough worsening over a period of one week. Patient is coughing up yellow-looking phlegm. In emergency room, she was noted to be febrile to 102, she was also noted to be hypoxic  with O2 sats of 88% on room air and tachypneic. She was in atrial fibrillation at rate of 150 and severely hypertensive with systolic blood pressure around 170. Chest x-ray however did not show any pneumonia. Labs revealed neutrophilia, hyponatremia. Exam showed bilateral rales and wheezes as well as lower extremity swelling, hospitalist services were contacted for admission. Patient admits of tightness in the chest intermittently as well as nausea and feeling presyncopal earlier today.   VITAL SIGNS:  Blood pressure 176/95, pulse 101, temperature 98.2 F (36.8 C), temperature source Oral, resp. rate 19, height 4\' 11"  (1.499 m), weight 69.899 kg (154 lb 1.6 oz), SpO2 96 %.  I/O:   Intake/Output Summary (Last 24 hours) at 11/07/15 1548 Last data filed at 11/07/15 1351  Gross per 24 hour  Intake    720 ml  Output    350 ml  Net    370 ml    PHYSICAL EXAMINATION:  GENERAL:  81 y.o.-year-old patient sitting up in the bed with no acute distress.  LUNGS: Fine wheezes, good air movement, no respiratory distress, not requiring nasal cannula CARDIOVASCULAR: S1, S2 normal. No murmurs, rubs, or gallops. Irregular ABDOMEN: Soft, non-tender, non-distended. Bowel sounds present. No organomegaly or mass.  EXTREMITIES: No pedal edema, cyanosis, or clubbing.  NEUROLOGIC: Cranial nerves II through XII are intact. Muscle strength 5/5 in all extremities. Sensation intact. Gait not checked.  PSYCHIATRIC: The patient is alert and oriented x 3.  SKIN: No obvious rash, lesion, or ulcer. Bruising over right hand noted  DATA REVIEW:   CBC  Recent Labs Lab 11/07/15 0404  WBC 14.7*  HGB 12.4  HCT 37.2  PLT 207    Chemistries   Recent Labs Lab 11/03/15 0911  11/07/15 0404  NA 133*  < > 130*  K 4.2  < > 4.6  CL 101  < > 98*  CO2 24  < > 27  GLUCOSE 108*  < > 186*  BUN 12  < > 29*  CREATININE 0.76  < > 0.85  CALCIUM 9.1  < > 8.9  AST 18  --   --   ALT 15  --   --   ALKPHOS 59  --   --    BILITOT 0.4  --   --   < > = values in this interval not displayed.  Cardiac Enzymes  Recent Labs Lab 11/03/15 0911  TROPONINI <0.03    Microbiology Results  Results for orders placed or performed during the hospital encounter of  11/03/15  Culture, blood (routine x 2)     Status: None (Preliminary result)   Collection Time: 11/03/15  9:11 AM  Result Value Ref Range Status   Specimen Description BLOOD LEFT ARM  Final   Special Requests BOTTLES DRAWN AEROBIC AND ANAEROBIC  2CC  Final   Culture NO GROWTH 4 DAYS  Final   Report Status PENDING  Incomplete  Urine culture     Status: None   Collection Time: 11/03/15  9:11 AM  Result Value Ref Range Status   Specimen Description URINE, RANDOM  Final   Special Requests NONE  Final   Culture NO GROWTH 2 DAYS  Final   Report Status 11/05/2015 FINAL  Final  Culture, blood (routine x 2)     Status: None (Preliminary result)   Collection Time: 11/03/15  9:19 AM  Result Value Ref Range Status   Specimen Description BLOOD LEFT ASSIST CONTROL  Final   Special Requests BOTTLES DRAWN AEROBIC AND ANAEROBIC  2CC  Final   Culture NO GROWTH 4 DAYS  Final   Report Status PENDING  Incomplete    RADIOLOGY:  No results found.  EKG:   Orders placed or performed during the hospital encounter of 08/05/15  . EKG 12-Lead  . EKG 12-Lead  . ED EKG within 10 minutes  . ED EKG within 10 minutes  . EKG      Management plans discussed with the patient, family and they are in agreement.  CODE STATUS:     Code Status Orders        Start     Ordered   11/03/15 1533  Full code   Continuous     11/03/15 1532    Advance Directive Documentation        Most Recent Value   Type of Advance Directive  Living will, Healthcare Power of Attorney   Pre-existing out of facility DNR order (yellow form or pink MOST form)     "MOST" Form in Place?        TOTAL TIME TAKING CARE OF THIS PATIENT: 40 minutes.  Greater than 50% of time spent in care  coordination and counseling.  Myrtis Ser M.D on 11/07/2015 at 3:48 PM  Between 7am to 6pm - Pager - [phone]  After 6pm go to [email or web address] - password [email or web address] [facility]  Office  [phone]  CC: Primary care physician; Idelle Crouch, MD

## 2015-11-07 NOTE — Progress Notes (Signed)
Patient discharged via EMS and stretcher to [facility]. Report called by taylor RN. Tele box removed and returned to tele. IV discontinued. Patient stable at time of discharge.

## 2015-11-08 DIAGNOSIS — E119 Type 2 diabetes mellitus without complications: Secondary | ICD-10-CM | POA: Diagnosis not present

## 2015-11-08 LAB — CULTURE, BLOOD (ROUTINE X 2)
CULTURE: NO GROWTH
CULTURE: NO GROWTH

## 2015-11-08 LAB — GLUCOSE, CAPILLARY
Glucose-Capillary: 103 mg/dL — ABNORMAL HIGH (ref 65–99)
Glucose-Capillary: 238 mg/dL — ABNORMAL HIGH (ref 65–99)
Glucose-Capillary: 237 mg/dL — ABNORMAL HIGH (ref 65–99)

## 2015-11-08 MED ORDER — IPRATROPIUM-ALBUTEROL 0.5-2.5 (3) MG/3ML IN SOLN: 3.0000 mL | Freq: Three times a day (TID) | RESPIRATORY_TRACT | Status: DC

## 2015-11-09 DIAGNOSIS — D72829 Elevated white blood cell count, unspecified: Secondary | ICD-10-CM | POA: Diagnosis not present

## 2015-11-09 DIAGNOSIS — E871 Hypo-osmolality and hyponatremia: Secondary | ICD-10-CM | POA: Diagnosis not present

## 2015-11-09 DIAGNOSIS — E119 Type 2 diabetes mellitus without complications: Secondary | ICD-10-CM | POA: Diagnosis present

## 2015-11-10 DIAGNOSIS — E119 Type 2 diabetes mellitus without complications: Secondary | ICD-10-CM | POA: Diagnosis not present

## 2015-11-10 LAB — COMPREHENSIVE METABOLIC PANEL
ALBUMIN: 3.2 g/dL — AB (ref 3.5–5.0)
ALT: 33 U/L (ref 14–54)
AST: 17 U/L (ref 15–41)
Alkaline Phosphatase: 52 U/L (ref 38–126)
Anion gap: 7 (ref 5–15)
BUN: 20 mg/dL (ref 6–20)
CHLORIDE: 96 mmol/L — AB (ref 101–111)
CO2: 30 mmol/L (ref 22–32)
Calcium: 8.9 mg/dL (ref 8.9–10.3)
Creatinine, Ser: 0.73 mg/dL (ref 0.44–1.00)
GFR calc Af Amer: >60 mL/min (ref >60)
GFR calc non Af Amer: >60 mL/min (ref >60)
GLUCOSE: 61 mg/dL — AB (ref 65–99)
POTASSIUM: 3.6 mmol/L (ref 3.5–5.1)
SODIUM: 133 mmol/L — AB (ref 135–145)
Total Bilirubin: 0.7 mg/dL (ref 0.3–1.2)
Total Protein: 5.8 g/dL — ABNORMAL LOW (ref 6.5–8.1)

## 2015-11-10 LAB — CBC WITH DIFFERENTIAL/PLATELET
BASOS ABS: 0 10*3/uL (ref 0–0.1)
BASOS PCT: 0 %
EOS ABS: 0.1 10*3/uL (ref 0–0.7)
Eosinophils Relative: 1 %
HEMATOCRIT: 41.6 % (ref 35.0–47.0)
HEMOGLOBIN: 13.7 g/dL (ref 12.0–16.0)
Lymphocytes Relative: 13 %
Lymphs Abs: 2 10*3/uL (ref 1.0–3.6)
MCH: 31.2 pg (ref 26.0–34.0)
MCHC: 33 g/dL (ref 32.0–36.0)
MCV: 94.5 fL (ref 80.0–100.0)
MONOS PCT: 7 %
Monocytes Absolute: 1 10*3/uL — ABNORMAL HIGH (ref 0.2–0.9)
Neutro Abs: 12 10*3/uL — ABNORMAL HIGH (ref 1.4–6.5)
Neutrophils Relative %: 79 %
Platelets: 244 10*3/uL (ref 150–440)
RBC: 4.4 MIL/uL (ref 3.80–5.20)
RDW: 14.2 % (ref 11.5–14.5)
WBC: 15.1 10*3/uL — AB (ref 3.6–11.0)

## 2015-11-10 LAB — GLUCOSE, CAPILLARY
GLUCOSE-CAPILLARY: 197 mg/dL — AB (ref 65–99)
Glucose-Capillary: 59 mg/dL — ABNORMAL LOW (ref 65–99)
Glucose-Capillary: 100 mg/dL — ABNORMAL HIGH (ref 65–99)
Glucose-Capillary: 376 mg/dL — ABNORMAL HIGH (ref 65–99)
Glucose-Capillary: 236 mg/dL — ABNORMAL HIGH (ref 65–99)

## 2015-11-11 DIAGNOSIS — E119 Type 2 diabetes mellitus without complications: Secondary | ICD-10-CM | POA: Diagnosis not present

## 2015-11-11 LAB — GLUCOSE, CAPILLARY
GLUCOSE-CAPILLARY: 214 mg/dL — AB (ref 65–99)
GLUCOSE-CAPILLARY: 225 mg/dL — AB (ref 65–99)
GLUCOSE-CAPILLARY: 65 mg/dL (ref 65–99)
GLUCOSE-CAPILLARY: 199 mg/dL — AB (ref 65–99)
Glucose-Capillary: 57 mg/dL — ABNORMAL LOW (ref 65–99)
Glucose-Capillary: 81 mg/dL (ref 65–99)
Glucose-Capillary: 92 mg/dL (ref 65–99)
Glucose-Capillary: 248 mg/dL — ABNORMAL HIGH (ref 65–99)

## 2015-11-12 DIAGNOSIS — E119 Type 2 diabetes mellitus without complications: Secondary | ICD-10-CM | POA: Diagnosis not present

## 2015-11-12 LAB — GLUCOSE, CAPILLARY
Glucose-Capillary: 143 mg/dL — ABNORMAL HIGH (ref 65–99)
Glucose-Capillary: 277 mg/dL — ABNORMAL HIGH (ref 65–99)

## 2015-11-13 LAB — GLUCOSE, CAPILLARY
GLUCOSE-CAPILLARY: 100 mg/dL — AB (ref 65–99)
GLUCOSE-CAPILLARY: 151 mg/dL — AB (ref 65–99)
Glucose-Capillary: 79 mg/dL (ref 65–99)
Glucose-Capillary: 58 mg/dL — ABNORMAL LOW (ref 65–99)
Glucose-Capillary: 140 mg/dL — ABNORMAL HIGH (ref 65–99)

## 2015-11-14 DIAGNOSIS — E119 Type 2 diabetes mellitus without complications: Secondary | ICD-10-CM | POA: Diagnosis not present

## 2015-11-16 LAB — GLUCOSE, CAPILLARY
GLUCOSE-CAPILLARY: 69 mg/dL (ref 65–99)
GLUCOSE-CAPILLARY: 187 mg/dL — AB (ref 65–99)
GLUCOSE-CAPILLARY: 119 mg/dL — AB (ref 65–99)
GLUCOSE-CAPILLARY: 225 mg/dL — AB (ref 65–99)
GLUCOSE-CAPILLARY: 281 mg/dL — AB (ref 65–99)
Glucose-Capillary: 163 mg/dL — ABNORMAL HIGH (ref 65–99)
Glucose-Capillary: 67 mg/dL (ref 65–99)
Glucose-Capillary: 176 mg/dL — ABNORMAL HIGH (ref 65–99)
Glucose-Capillary: 71 mg/dL (ref 65–99)

## 2015-11-17 DIAGNOSIS — E119 Type 2 diabetes mellitus without complications: Secondary | ICD-10-CM | POA: Diagnosis not present

## 2015-11-17 LAB — GLUCOSE, CAPILLARY
GLUCOSE-CAPILLARY: 71 mg/dL (ref 65–99)
GLUCOSE-CAPILLARY: 199 mg/dL — AB (ref 65–99)
Glucose-Capillary: 164 mg/dL — ABNORMAL HIGH (ref 65–99)
Glucose-Capillary: 163 mg/dL — ABNORMAL HIGH (ref 65–99)
Glucose-Capillary: 125 mg/dL — ABNORMAL HIGH (ref 65–99)

## 2015-11-18 DIAGNOSIS — E119 Type 2 diabetes mellitus without complications: Secondary | ICD-10-CM | POA: Diagnosis not present

## 2015-11-18 LAB — GLUCOSE, CAPILLARY
Glucose-Capillary: 68 mg/dL (ref 65–99)
Glucose-Capillary: 118 mg/dL — ABNORMAL HIGH (ref 65–99)
Glucose-Capillary: 274 mg/dL — ABNORMAL HIGH (ref 65–99)

## 2015-11-19 DIAGNOSIS — E119 Type 2 diabetes mellitus without complications: Secondary | ICD-10-CM | POA: Diagnosis not present

## 2015-11-19 LAB — GLUCOSE, CAPILLARY
GLUCOSE-CAPILLARY: 69 mg/dL (ref 65–99)
Glucose-Capillary: 122 mg/dL — ABNORMAL HIGH (ref 65–99)
Glucose-Capillary: 240 mg/dL — ABNORMAL HIGH (ref 65–99)
Glucose-Capillary: 234 mg/dL — ABNORMAL HIGH (ref 65–99)

## 2015-11-20 DIAGNOSIS — E119 Type 2 diabetes mellitus without complications: Secondary | ICD-10-CM | POA: Diagnosis not present

## 2015-11-20 LAB — GLUCOSE, CAPILLARY
GLUCOSE-CAPILLARY: 76 mg/dL (ref 65–99)
GLUCOSE-CAPILLARY: 107 mg/dL — AB (ref 65–99)
Glucose-Capillary: 292 mg/dL — ABNORMAL HIGH (ref 65–99)

## 2015-11-21 DIAGNOSIS — E119 Type 2 diabetes mellitus without complications: Secondary | ICD-10-CM | POA: Diagnosis not present

## 2015-11-21 LAB — GLUCOSE, CAPILLARY
GLUCOSE-CAPILLARY: 62 mg/dL — AB (ref 65–99)
Glucose-Capillary: 103 mg/dL — ABNORMAL HIGH (ref 65–99)
Glucose-Capillary: 256 mg/dL — ABNORMAL HIGH (ref 65–99)
Glucose-Capillary: 260 mg/dL — ABNORMAL HIGH (ref 65–99)

## 2015-11-22 DIAGNOSIS — E119 Type 2 diabetes mellitus without complications: Secondary | ICD-10-CM | POA: Diagnosis not present

## 2015-11-22 LAB — GLUCOSE, CAPILLARY
GLUCOSE-CAPILLARY: 258 mg/dL — AB (ref 65–99)
GLUCOSE-CAPILLARY: 282 mg/dL — AB (ref 65–99)
Glucose-Capillary: 69 mg/dL (ref 65–99)
Glucose-Capillary: 82 mg/dL (ref 65–99)

## 2015-11-25 DIAGNOSIS — E119 Type 2 diabetes mellitus without complications: Secondary | ICD-10-CM | POA: Diagnosis not present

## 2015-11-25 LAB — GLUCOSE, CAPILLARY
GLUCOSE-CAPILLARY: 92 mg/dL (ref 65–99)
GLUCOSE-CAPILLARY: 70 mg/dL (ref 65–99)
GLUCOSE-CAPILLARY: 99 mg/dL (ref 65–99)
GLUCOSE-CAPILLARY: 99 mg/dL (ref 65–99)
Glucose-Capillary: 85 mg/dL (ref 65–99)
Glucose-Capillary: 116 mg/dL — ABNORMAL HIGH (ref 65–99)
Glucose-Capillary: 82 mg/dL (ref 65–99)
Glucose-Capillary: 122 mg/dL — ABNORMAL HIGH (ref 65–99)
Glucose-Capillary: 94 mg/dL (ref 65–99)

## 2015-12-08 ENCOUNTER — Other Ambulatory Visit: Payer: Medicare Other

## 2015-12-08 ENCOUNTER — Ambulatory Visit: Payer: Medicare Other

## 2015-12-28 DIAGNOSIS — I5043 Acute on chronic combined systolic (congestive) and diastolic (congestive) heart failure: Secondary | ICD-10-CM | POA: Insufficient documentation

## 2016-01-02 ENCOUNTER — Other Ambulatory Visit: Payer: Self-pay | Admitting: Oncology

## 2016-04-04 ENCOUNTER — Other Ambulatory Visit: Payer: Self-pay | Admitting: Oncology

## 2016-05-16 ENCOUNTER — Ambulatory Visit
Admission: RE | Admit: 2016-05-16 | Discharge: 2016-05-16 | Disposition: A | Discharge status: Home / Self Care | Payer: Medicare Other | Source: Ambulatory Visit | Attending: Oncology | Admitting: Oncology

## 2016-05-16 ENCOUNTER — Other Ambulatory Visit: Payer: Self-pay | Admitting: Oncology

## 2016-05-16 DIAGNOSIS — C50919 Malignant neoplasm of unspecified site of unspecified female breast: Secondary | ICD-10-CM

## 2016-05-16 DIAGNOSIS — M810 Age-related osteoporosis without current pathological fracture: Secondary | ICD-10-CM | POA: Diagnosis not present

## 2016-05-16 DIAGNOSIS — Z13820 Encounter for screening for osteoporosis: Secondary | ICD-10-CM | POA: Diagnosis not present

## 2016-05-16 DIAGNOSIS — Z853 Personal history of malignant neoplasm of breast: Secondary | ICD-10-CM | POA: Diagnosis present

## 2016-05-16 HISTORY — DX: Malignant neoplasm of unspecified site of unspecified female breast: C50.919

## 2016-05-17 ENCOUNTER — Telehealth: Payer: Self-pay | Admitting: *Deleted

## 2016-05-17 NOTE — Telephone Encounter (Signed)
Per L Herring, NP continue Calcium and Vitamin D She has had Osteoporosis for many years and was taken off Fosamax 2007. Patient informed of this and states she is continuing to take her calcium and Vit D

## 2016-08-08 DIAGNOSIS — M25552 Pain in left hip: Secondary | ICD-10-CM

## 2016-08-08 DIAGNOSIS — M545 Low back pain, unspecified: Secondary | ICD-10-CM | POA: Insufficient documentation

## 2016-08-08 DIAGNOSIS — G8929 Other chronic pain: Secondary | ICD-10-CM | POA: Insufficient documentation

## 2016-08-21 ENCOUNTER — Other Ambulatory Visit: Payer: Medicare Other

## 2016-08-21 ENCOUNTER — Ambulatory Visit: Payer: Medicare Other | Admitting: Oncology

## 2016-08-27 ENCOUNTER — Other Ambulatory Visit: Payer: Medicare Other

## 2016-08-27 ENCOUNTER — Ambulatory Visit: Payer: Medicare Other | Admitting: Oncology

## 2016-09-05 ENCOUNTER — Other Ambulatory Visit: Payer: Self-pay | Admitting: *Deleted

## 2016-09-05 DIAGNOSIS — Z853 Personal history of malignant neoplasm of breast: Secondary | ICD-10-CM

## 2016-09-05 NOTE — Progress Notes (Signed)
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  INTERVAL HISTORY: Patient returns to clinic today for routine yearly evaluation. She is tolerating anastrozole well without significant side effects. She currently feels well and is at her baseline. She has no neurologic complaints. She denies any recent fevers. She has a good appetite and denies weight loss. She has no chest pain or shortness of breath. She denies any nausea, vomiting, constipation, or diarrhea. She has no urinary complaints. Patient offers no specific complaints today.  REVIEW OF SYSTEMS:   Review of Systems  Constitutional: Negative.  Negative for fever, malaise/fatigue and weight loss.  Respiratory: Negative.  Negative for cough and shortness of breath.   Cardiovascular: Negative.  Negative for chest pain and leg swelling.  Gastrointestinal: Negative.  Negative for abdominal pain.  Genitourinary: Negative.   Musculoskeletal: Negative.   Neurological: Negative.  Negative for sensory change and weakness.  Psychiatric/Behavioral: Negative.  The patient is not nervous/anxious.     As per HPI. Otherwise, a complete review of systems is negative.  PAST MEDICAL HISTORY: Past Medical History:  Diagnosis Date  . Breast cancer [facility]) 2013   right breast, radiation  . Breast cancer (Rachael Horne) 2007   left breast, radiation  . Cancer ([place])    BL breast  . Coronary artery disease   . Diabetes mellitus without complication ([place])   . DVT (deep venous thrombosis) ([place])   . Hyperlipemia   . Hypertension   . Stroke [facility])     PAST SURGICAL HISTORY: Past Surgical History:  Procedure Laterality Date  . APPENDECTOMY    .  BREAST LUMPECTOMY     removal of CA  . CHOLECYSTECTOMY    . TONSILLECTOMY      FAMILY HISTORY: Reviewed and unchanged. No reported history of malignancy or chronic disease.  ADVANCED DIRECTIVES (Y/N):  N  HEALTH MAINTENANCE: Social History  Substance Use Topics  . Smoking status: Never Smoker  . Smokeless tobacco: Never Used  . Alcohol use No     Colonoscopy:  PAP:  Bone density:  Lipid panel:  Allergies  Allergen Reactions  . Amoxicillin Other (See Comments)    Current Outpatient Prescriptions  Medication Sig Dispense Refill  . acetaminophen (TYLENOL) 500 MG tablet Take 1,000 mg by mouth 2 (two) times daily as needed for mild pain.    Marland Kitchen albuterol (PROVENTIL HFA;VENTOLIN HFA) 108 (90 BASE) MCG/ACT inhaler Inhale 2 puffs into the lungs every 6 (six) hours as needed for wheezing or shortness of breath.    . ALPRAZolam (XANAX) 0.25 MG tablet Take 0.5 tablets (0.125 mg total) by mouth at bedtime as needed for anxiety or sleep. 30 tablet 0  . anastrozole (ARIMIDEX) 1 MG tablet TAKE ONE (1) TABLET BY MOUTH EVERY DAY 90 tablet 0  . budesonide-formoterol (SYMBICORT) 160-4.5 MCG/ACT inhaler Inhale 2 puffs into the lungs daily.     Marland Kitchen CALCIUM-VITAMIN D-VITAMIN K PO Take 1 tablet by mouth 2 (two) times daily.    . dabigatran (PRADAXA) 150 MG CAPS capsule Take 150 mg by mouth 2 (two) times daily.    Marland Kitchen diltiazem (DILACOR XR) 240 MG 24 hr capsule Take 240 mg by mouth daily.    . diphenhydramine-acetaminophen (TYLENOL PM) 25-500 MG  TABS Take 1 tablet by mouth at bedtime as needed (for sleep).    . fluticasone (FLONASE) 50 MCG/ACT nasal spray Place 2 sprays into both nostrils daily.    . furosemide (LASIX) 40 MG tablet Take 40 mg by mouth 2 (two) times daily.     Marland Kitchen glimepiride (AMARYL) 4 MG tablet Take 4 mg by mouth daily.    Marland Kitchen GLUCOSAMINE-CHONDROITIN PO Take 1 tablet by mouth daily.    Marland Kitchen loratadine (CLARITIN) 10 MG tablet Take 10 mg by mouth daily as needed for allergies.    Marland Kitchen losartan  (COZAAR) 50 MG tablet Take 50 mg by mouth daily.    Marland Kitchen lovastatin (MEVACOR) 40 MG tablet Take 40 mg by mouth at bedtime.    . magnesium oxide (MAG-OX) 400 MG tablet Take 400 mg by mouth daily at 12 noon.    . metolazone (ZAROXOLYN) 2.5 MG tablet Take 2.5 mg by mouth daily.    . montelukast (SINGULAIR) 10 MG tablet Take 10 mg by mouth at bedtime.    . Multiple Vitamin (MULTIVITAMIN WITH MINERALS) TABS tablet Take 1 tablet by mouth daily at 12 noon.    . pantoprazole (PROTONIX) 40 MG tablet Take 40 mg by mouth daily.    . potassium chloride (K-DUR) 10 MEQ tablet Take 10 mEq by mouth daily at 12 noon.    . senna-docusate (SENOKOT-S) 8.6-50 MG tablet Take 1 tablet by mouth 2 (two) times daily as needed for mild constipation or moderate constipation. 30 tablet 0  . sodium chloride (OCEAN) 0.65 % SOLN nasal spray Place 1 spray into both nostrils as needed for congestion. 1 Bottle 0   No current facility-administered medications for this visit.     OBJECTIVE: Vitals:   09/06/16 1116  BP: (!) 147/87  Pulse: 81  Resp: 18  Temp: (!) 96.6 F (35.9 C)     Body mass index is 28.56 kg/m.    ECOG FS:2 - Symptomatic, <50% confined to bed  General: Well-developed, well-nourished, no acute distress. Sitting in a wheelchair. Eyes: Pink conjunctiva, anicteric sclera. Breasts: Patient requested exam be deferred today. Lungs: Clear to auscultation bilaterally. Heart: Regular rate and rhythm. No rubs, murmurs, or gallops. Abdomen: Soft, nontender, nondistended. No organomegaly noted, normoactive bowel sounds. Musculoskeletal: No edema, cyanosis, or clubbing. Neuro: Alert, answering all questions appropriately. Cranial nerves grossly intact. Skin: No rashes or petechiae noted. Psych: Normal affect.   LAB RESULTS:  Lab Results  Component Value Date   NA 133 (L) 11/10/2015   K 3.6 11/10/2015   CL 96 (L) 11/10/2015   CO2 30 11/10/2015   GLUCOSE 61 (L) 11/10/2015   BUN 20 11/10/2015   CREATININE  0.73 11/10/2015   CALCIUM 8.9 11/10/2015   PROT 5.8 (L) 11/10/2015   ALBUMIN 3.2 (L) 11/10/2015   AST 17 11/10/2015   ALT 33 11/10/2015   ALKPHOS 52 11/10/2015   BILITOT 0.7 11/10/2015   GFRNONAA >60 11/10/2015   GFRAA >60 11/10/2015    Lab Results  Component Value Date   WBC 15.1 (H) 11/10/2015   NEUTROABS 12.0 (H) 11/10/2015   HGB 13.7 11/10/2015   HCT 41.6 11/10/2015   MCV 94.5 11/10/2015   PLT 244 11/10/2015   Lab Results  Component Value Date   LABCA2 32.7 09/06/2016     STUDIES: No results found.  ASSESSMENT: Pathologic stage Ia ER/PR positive, HER-2 negative adenocarcinoma of the upper outer quadrant of the right breast.  PLAN:    1. Pathologic stage Ia ER/PR  positive, HER-2 negative adenocarcinoma of the upper outer quadrant of the right breast: Patient underwent lumpectomy on September 01, 2012. She subsequently completed adjuvant XRT and then initiated anastrozole in approximately January 2014. She will complete 5 years of treatment in January 2019. Her most recent mammogram on May 16, 2016 was reported as BI-RADS 2. Repeat in June 2018. Given patient's advanced age and decreased performance status, will minimize follow-up and return to clinic in 1 year for routine evaluation. 2. Osteoporosis: Patient's most recent bone mineral density on May 16, 2016 reported a T score of -2.8. Continue calcium and vitamin D supplementation. Repeat bone mineral density in June 2018.   Patient expressed understanding and was in agreement with this plan. She also understands that She can call clinic at any time with any questions, concerns, or complaints.   Primary cancer of upper outer quadrant of right female breast [facility])   Staging form: Breast, AJCC 7th Edition   - Clinical stage from 09/05/2016: Stage IA (T1b, N0, M0) - Signed by Lloyd Huger, MD on 09/05/2016  Lloyd Huger, MD   09/07/2016 6:10 PM

## 2016-09-06 ENCOUNTER — Inpatient Hospital Stay (HOSPITAL_BASED_OUTPATIENT_CLINIC_OR_DEPARTMENT_OTHER): Payer: Medicare Other | Admitting: Oncology

## 2016-09-06 ENCOUNTER — Inpatient Hospital Stay: Payer: Medicare Other | Attending: Oncology

## 2016-09-06 VITALS — BP 147/87 | HR 81 | Temp 96.6°F | Resp 18 | Wt 141.4 lb

## 2016-09-06 DIAGNOSIS — Z8673 Personal history of transient ischemic attack (TIA), and cerebral infarction without residual deficits: Secondary | ICD-10-CM

## 2016-09-06 DIAGNOSIS — Z79811 Long term (current) use of aromatase inhibitors: Secondary | ICD-10-CM

## 2016-09-06 DIAGNOSIS — C50411 Malignant neoplasm of upper-outer quadrant of right female breast: Secondary | ICD-10-CM | POA: Insufficient documentation

## 2016-09-06 DIAGNOSIS — Z853 Personal history of malignant neoplasm of breast: Secondary | ICD-10-CM

## 2016-09-06 DIAGNOSIS — Z7901 Long term (current) use of anticoagulants: Secondary | ICD-10-CM | POA: Insufficient documentation

## 2016-09-06 DIAGNOSIS — M810 Age-related osteoporosis without current pathological fracture: Secondary | ICD-10-CM | POA: Diagnosis not present

## 2016-09-06 DIAGNOSIS — Z923 Personal history of irradiation: Secondary | ICD-10-CM | POA: Diagnosis not present

## 2016-09-06 DIAGNOSIS — Z880 Allergy status to penicillin: Secondary | ICD-10-CM | POA: Insufficient documentation

## 2016-09-06 DIAGNOSIS — I2510 Atherosclerotic heart disease of native coronary artery without angina pectoris: Secondary | ICD-10-CM

## 2016-09-06 DIAGNOSIS — I10 Essential (primary) hypertension: Secondary | ICD-10-CM

## 2016-09-06 DIAGNOSIS — E119 Type 2 diabetes mellitus without complications: Secondary | ICD-10-CM | POA: Insufficient documentation

## 2016-09-06 DIAGNOSIS — E785 Hyperlipidemia, unspecified: Secondary | ICD-10-CM

## 2016-09-06 DIAGNOSIS — Z79899 Other long term (current) drug therapy: Secondary | ICD-10-CM

## 2016-09-06 DIAGNOSIS — Z170 Estrogen receptor positive status [ER+]: Secondary | ICD-10-CM | POA: Diagnosis not present

## 2016-09-06 NOTE — Progress Notes (Signed)
States is feeling well. Offers no complaints. 

## 2016-09-07 LAB — CANCER ANTIGEN 27.29: CA 27.29: 32.7 U/mL (ref 0.0–38.6)

## 2016-09-10 DIAGNOSIS — M810 Age-related osteoporosis without current pathological fracture: Secondary | ICD-10-CM | POA: Insufficient documentation

## 2016-10-08 ENCOUNTER — Other Ambulatory Visit: Payer: Self-pay | Admitting: Oncology

## 2017-01-20 ENCOUNTER — Emergency Department
Admission: EM | Admit: 2017-01-20 | Discharge: 2017-01-20 | Disposition: A | Discharge status: Home / Self Care | Payer: Medicare Other | Attending: Student in an Organized Health Care Education/Training Program | Admitting: Student in an Organized Health Care Education/Training Program

## 2017-01-20 ENCOUNTER — Encounter: Payer: Self-pay | Admitting: Emergency Medicine

## 2017-01-20 ENCOUNTER — Emergency Department: Payer: Medicare Other

## 2017-01-20 DIAGNOSIS — W01190A Fall on same level from slipping, tripping and stumbling with subsequent striking against furniture, initial encounter: Secondary | ICD-10-CM | POA: Diagnosis not present

## 2017-01-20 DIAGNOSIS — Y999 Unspecified external cause status: Secondary | ICD-10-CM | POA: Diagnosis not present

## 2017-01-20 DIAGNOSIS — I10 Essential (primary) hypertension: Secondary | ICD-10-CM | POA: Diagnosis not present

## 2017-01-20 DIAGNOSIS — Y929 Unspecified place or not applicable: Secondary | ICD-10-CM | POA: Diagnosis not present

## 2017-01-20 DIAGNOSIS — Z853 Personal history of malignant neoplasm of breast: Secondary | ICD-10-CM | POA: Insufficient documentation

## 2017-01-20 DIAGNOSIS — Z7984 Long term (current) use of oral hypoglycemic drugs: Secondary | ICD-10-CM | POA: Diagnosis not present

## 2017-01-20 DIAGNOSIS — S0990XA Unspecified injury of head, initial encounter: Secondary | ICD-10-CM | POA: Diagnosis present

## 2017-01-20 DIAGNOSIS — S0003XA Contusion of scalp, initial encounter: Secondary | ICD-10-CM

## 2017-01-20 DIAGNOSIS — E119 Type 2 diabetes mellitus without complications: Secondary | ICD-10-CM | POA: Diagnosis not present

## 2017-01-20 DIAGNOSIS — Z79899 Other long term (current) drug therapy: Secondary | ICD-10-CM | POA: Diagnosis not present

## 2017-01-20 DIAGNOSIS — I2510 Atherosclerotic heart disease of native coronary artery without angina pectoris: Secondary | ICD-10-CM | POA: Insufficient documentation

## 2017-01-20 DIAGNOSIS — W19XXXA Unspecified fall, initial encounter: Secondary | ICD-10-CM

## 2017-01-20 DIAGNOSIS — Y9389 Activity, other specified: Secondary | ICD-10-CM | POA: Insufficient documentation

## 2017-01-20 LAB — COMPREHENSIVE METABOLIC PANEL
ALBUMIN: 4.4 g/dL (ref 3.5–5.0)
ALK PHOS: 54 U/L (ref 38–126)
ALT: 21 U/L (ref 14–54)
AST: 25 U/L (ref 15–41)
Anion gap: 10 (ref 5–15)
BUN: 19 mg/dL (ref 6–20)
CALCIUM: 9.3 mg/dL (ref 8.9–10.3)
CHLORIDE: 91 mmol/L — AB (ref 101–111)
CO2: 31 mmol/L (ref 22–32)
CREATININE: 0.91 mg/dL (ref 0.44–1.00)
GFR calc Af Amer: >60 mL/min (ref >60)
GFR calc non Af Amer: 56 mL/min — ABNORMAL LOW (ref >60)
GLUCOSE: 107 mg/dL — AB (ref 65–99)
Potassium: 3.1 mmol/L — ABNORMAL LOW (ref 3.5–5.1)
SODIUM: 132 mmol/L — AB (ref 135–145)
Total Bilirubin: 0.4 mg/dL (ref 0.3–1.2)
Total Protein: 7.5 g/dL (ref 6.5–8.1)

## 2017-01-20 LAB — CBC WITH DIFFERENTIAL/PLATELET
Basophils Absolute: 0.1 10*3/uL (ref 0–0.1)
Basophils Relative: 1 %
EOS ABS: 0.1 10*3/uL (ref 0–0.7)
Eosinophils Relative: 1 %
HCT: 40.9 % (ref 35.0–47.0)
HEMOGLOBIN: 14.1 g/dL (ref 12.0–16.0)
LYMPHS ABS: 1 10*3/uL (ref 1.0–3.6)
Lymphocytes Relative: 12 %
MCH: 32.1 pg (ref 26.0–34.0)
MCHC: 34.4 g/dL (ref 32.0–36.0)
MCV: 93.3 fL (ref 80.0–100.0)
MONOS PCT: 9 %
Monocytes Absolute: 0.8 10*3/uL (ref 0.2–0.9)
NEUTROS PCT: 77 %
Neutro Abs: 6.5 10*3/uL (ref 1.4–6.5)
Platelets: 262 10*3/uL (ref 150–440)
RBC: 4.38 MIL/uL (ref 3.80–5.20)
RDW: 14.2 % (ref 11.5–14.5)
WBC: 8.4 10*3/uL (ref 3.6–11.0)

## 2017-01-20 LAB — URINALYSIS, ROUTINE W REFLEX MICROSCOPIC
Bilirubin Urine: NEGATIVE
GLUCOSE, UA: NEGATIVE mg/dL
Hgb urine dipstick: NEGATIVE
KETONES UR: NEGATIVE mg/dL
LEUKOCYTES UA: NEGATIVE
Nitrite: NEGATIVE
PH: 7 (ref 5.0–8.0)
Protein, ur: NEGATIVE mg/dL
SPECIFIC GRAVITY, URINE: 1.005 (ref 1.005–1.030)

## 2017-01-20 LAB — PROTIME-INR
INR: 1.19
PROTHROMBIN TIME: 15.2 s (ref 11.4–15.2)

## 2017-01-20 LAB — TROPONIN I: Troponin I: <0.03 ng/mL (ref <0.03)

## 2017-01-20 MED ORDER — CYCLOBENZAPRINE HCL 10 MG PO TABS: 5.0000 mg | ORAL_TABLET | Freq: Every day | ORAL | 0 refills | Status: DC

## 2017-01-20 MED ORDER — ACETAMINOPHEN 500 MG PO TABS
1000.0000 mg | ORAL_TABLET | Freq: Once | ORAL | Status: AC
  Administered 2017-01-20: 1000 mg via ORAL
  Filled 2017-01-20: qty 2

## 2017-01-20 MED ORDER — AMLODIPINE BESYLATE 5 MG PO TABS
5.0000 mg | ORAL_TABLET | Freq: Once | ORAL | Status: AC
  Administered 2017-01-20: 5 mg via ORAL
  Filled 2017-01-20: qty 1

## 2017-01-20 NOTE — Discharge Instructions (Signed)
Please return immediately to the hospital should he have any worsening of her symptoms. This would include any worsening nausea, intractable vomiting, worse headache of her life, numbness or tingling, confusion or any other concerns that he may have.

## 2017-01-20 NOTE — ED Notes (Signed)
Patient transported to CT 

## 2017-01-20 NOTE — ED Provider Notes (Signed)
[facility] Emergency Department Provider Note    First MD Initiated Contact with Patient 01/20/17 2003     (approximate)  I have reviewed the triage vital signs and the nursing notes.   HISTORY  Chief Complaint Fall    HPI Rachael Horne is a 83 y.o. female with history of breast cancer as well as history of CVA currently on [place] presents after mechanical fall with head injury. Patient was getting changed in her pants trying to get into bed but tripped over her pain and leg and fell backwards hitting her head on the corner of a table. There is no loss of consciousness. Has no weakness. No visual disturbance. No numbness or tingling. Does have mild headache in the posterior region of her head. This radiates down into her neck. She denies any chest pain or palpitations.   Past Medical History:  Diagnosis Date  . Breast cancer [facility]) 2013   right breast, radiation  . Breast cancer ([place]) 2007   left breast, radiation  . Cancer ([place])    BL breast  . Coronary artery disease   . Diabetes mellitus without complication ([place])   . DVT (deep venous thrombosis) ([place])   . Hyperlipemia   . Hypertension   . Stroke [facility])    History reviewed. No pertinent family history. Past Surgical History:  Procedure Laterality Date  . APPENDECTOMY    . BREAST LUMPECTOMY     removal of CA  . CHOLECYSTECTOMY    . TONSILLECTOMY     Patient Active Problem List   Diagnosis Date Noted  . Sepsis ([place]) 11/03/2015  . Acute bronchitis 11/03/2015  . Fever 11/03/2015  . Atrial fibrillation with RVR ([place]) 11/03/2015  . A-fib ([place]) 08/22/2015  . Primary cancer of upper outer quadrant of right female breast ([place]) 08/22/2015  . Diabetes mellitus, type 2 ([place]) 08/22/2015  . Allergy to environmental factors 08/22/2015  . History of knee problem 08/22/2015  . HLD (hyperlipidemia) 08/22/2015  . BP (high blood pressure) 08/22/2015  . MI (mitral incompetence) 08/22/2015  .  Arthritis of hand, degenerative 08/22/2015  . TI (tricuspid incompetence) 08/22/2015  . Bradycardia 08/23/2014  . Arteriosclerosis of coronary artery 08/23/2014  . History of asthma 08/23/2014  . H/O deep venous thrombosis 08/23/2014  . Arthritis, degenerative 08/23/2014  . Adiposity 08/23/2014  . Temporary cerebral vascular dysfunction 08/23/2014  . Awareness of heartbeats 08/23/2014  . Absolute anemia 05/06/2014  . Cerebrovascular accident, old 07/03/1997      Prior to Admission medications   Medication Sig Start Date End Date Taking? Authorizing Provider  acetaminophen (TYLENOL) 500 MG tablet Take 1,000 mg by mouth 2 (two) times daily as needed for mild pain.    Historical Provider, MD  albuterol (PROVENTIL HFA;VENTOLIN HFA) 108 (90 BASE) MCG/ACT inhaler Inhale 2 puffs into the lungs every 6 (six) hours as needed for wheezing or shortness of breath.    Historical Provider, MD  ALPRAZolam Duanne Moron) 0.25 MG tablet Take 0.5 tablets (0.125 mg total) by mouth at bedtime as needed for anxiety or sleep. 11/07/15   Aldean Jewett, MD  anastrozole (ARIMIDEX) 1 MG tablet TAKE ONE (1) TABLET BY MOUTH EVERY DAY 10/08/16   Lloyd Huger, MD  budesonide-formoterol [facility]) 160-4.5 MCG/ACT inhaler Inhale 2 puffs into the lungs daily.     Historical Provider, MD  CALCIUM-VITAMIN D-VITAMIN K PO Take 1 tablet by mouth 2 (two) times daily.    Historical Provider, MD  dabigatran (PRADAXA)  150 MG CAPS capsule Take 150 mg by mouth 2 (two) times daily.    Historical Provider, MD  diltiazem (DILACOR XR) 240 MG 24 hr capsule Take 240 mg by mouth daily.    Historical Provider, MD  diphenhydramine-acetaminophen (TYLENOL PM) 25-500 MG TABS Take 1 tablet by mouth at bedtime as needed (for sleep).    Historical Provider, MD  fluticasone (FLONASE) 50 MCG/ACT nasal spray Place 2 sprays into both nostrils daily.    Historical Provider, MD  furosemide (LASIX) 40 MG tablet Take 40 mg by mouth 2 (two) times daily.      Historical Provider, MD  glimepiride (AMARYL) 4 MG tablet Take 4 mg by mouth daily.    Historical Provider, MD  GLUCOSAMINE-CHONDROITIN PO Take 1 tablet by mouth daily.    Historical Provider, MD  loratadine (CLARITIN) 10 MG tablet Take 10 mg by mouth daily as needed for allergies.    Historical Provider, MD  losartan (COZAAR) 50 MG tablet Take 50 mg by mouth daily.    Historical Provider, MD  lovastatin (MEVACOR) 40 MG tablet Take 40 mg by mouth at bedtime.    Historical Provider, MD  magnesium oxide (MAG-OX) 400 MG tablet Take 400 mg by mouth daily at 12 noon.    Historical Provider, MD  metolazone (ZAROXOLYN) 2.5 MG tablet Take 2.5 mg by mouth daily.    Historical Provider, MD  montelukast (SINGULAIR) 10 MG tablet Take 10 mg by mouth at bedtime.    Historical Provider, MD  Multiple Vitamin (MULTIVITAMIN WITH MINERALS) TABS tablet Take 1 tablet by mouth daily at 12 noon.    Historical Provider, MD  pantoprazole (PROTONIX) 40 MG tablet Take 40 mg by mouth daily.    Historical Provider, MD  potassium chloride (K-DUR) 10 MEQ tablet Take 10 mEq by mouth daily at 12 noon.    Historical Provider, MD  senna-docusate (SENOKOT-S) 8.6-50 MG tablet Take 1 tablet by mouth 2 (two) times daily as needed for mild constipation or moderate constipation. 11/07/15   Aldean Jewett, MD  sodium chloride (OCEAN) 0.65 % SOLN nasal spray Place 1 spray into both nostrils as needed for congestion. 11/07/15   Aldean Jewett, MD    Allergies Amoxicillin    Social History Social History  Substance Use Topics  . Smoking status: Never Smoker  . Smokeless tobacco: Never Used  . Alcohol use No    Review of Systems Patient denies headaches, rhinorrhea, blurry vision, numbness, shortness of breath, chest pain, edema, cough, abdominal pain, nausea, vomiting, diarrhea, dysuria, fevers, rashes or hallucinations unless otherwise stated above in HPI. ____________________________________________   PHYSICAL  EXAM:  VITAL SIGNS: Vitals:   01/20/17 2003 01/20/17 2045  BP: (!) 209/73 (!) 188/80  Pulse: 92 98  Resp: 14 15  Temp: 99 F (37.2 C)     Constitutional: Alert and oriented. Well appearing and in no acute distress. Eyes: Conjunctivae are normal. PERRL. EOMI. Head: ttp of left posterior scalp, no abrasion or laceration Nose: No congestion/rhinnorhea. Mouth/Throat: Mucous membranes are moist.  Oropharynx non-erythematous. Neck: No stridor.  Mild paraspinal ttp Hematological/Lymphatic/Immunilogical: No cervical lymphadenopathy. Cardiovascular: Normal rate, regular rhythm. Grossly normal heart sounds.  Good peripheral circulation. Respiratory: Normal respiratory effort.  No retractions. Lungs CTAB. Gastrointestinal: Soft and nontender. No distention. No abdominal bruits. No CVA tenderness. Musculoskeletal: No lower extremity tenderness nor edema.  No joint effusions. Neurologic:  Normal speech and language. No gross focal neurologic deficits are appreciated. No gait instability. Skin:  Skin is  warm, dry and intact. No rash noted. Psychiatric: Mood and affect are normal. Speech and behavior are normal.  ____________________________________________   LABS (all labs ordered are listed, but only abnormal results are displayed)  Results for orders placed or performed during the hospital encounter of 01/20/17 (from the past 24 hour(s))  Comprehensive metabolic panel     Status: Abnormal   Collection Time: 01/20/17  8:20 PM  Result Value Ref Range   Sodium 132 (L) 135 - 145 mmol/L   Potassium 3.1 (L) 3.5 - 5.1 mmol/L   Chloride 91 (L) 101 - 111 mmol/L   CO2 31 22 - 32 mmol/L   Glucose, Bld 107 (H) 65 - 99 mg/dL   BUN 19 6 - 20 mg/dL   Creatinine, Ser 0.91 0.44 - 1.00 mg/dL   Calcium 9.3 8.9 - 10.3 mg/dL   Total Protein 7.5 6.5 - 8.1 g/dL   Albumin 4.4 3.5 - 5.0 g/dL   AST 25 15 - 41 U/L   ALT 21 14 - 54 U/L   Alkaline Phosphatase 54 38 - 126 U/L   Total Bilirubin 0.4 0.3 - 1.2  mg/dL   GFR calc non Af Amer 56 (L) >60 mL/min   GFR calc Af Amer >60 >60 mL/min   Anion gap 10 5 - 15  Urinalysis, Routine w reflex microscopic     Status: Abnormal   Collection Time: 01/20/17  8:20 PM  Result Value Ref Range   Color, Urine COLORLESS (A) YELLOW   APPearance CLEAR (A) CLEAR   Specific Gravity, Urine 1.005 1.005 - 1.030   pH 7.0 5.0 - 8.0   Glucose, UA NEGATIVE NEGATIVE mg/dL   Hgb urine dipstick NEGATIVE NEGATIVE   Bilirubin Urine NEGATIVE NEGATIVE   Ketones, ur NEGATIVE NEGATIVE mg/dL   Protein, ur NEGATIVE NEGATIVE mg/dL   Nitrite NEGATIVE NEGATIVE   Leukocytes, UA NEGATIVE NEGATIVE  Troponin I     Status: None   Collection Time: 01/20/17  8:20 PM  Result Value Ref Range   Troponin I <0.03 <0.03 ng/mL  CBC WITH DIFFERENTIAL     Status: None   Collection Time: 01/20/17  8:20 PM  Result Value Ref Range   WBC 8.4 3.6 - 11.0 K/uL   RBC 4.38 3.80 - 5.20 MIL/uL   Hemoglobin 14.1 12.0 - 16.0 g/dL   HCT 40.9 35.0 - 47.0 %   MCV 93.3 80.0 - 100.0 fL   MCH 32.1 26.0 - 34.0 pg   MCHC 34.4 32.0 - 36.0 g/dL   RDW 14.2 11.5 - 14.5 %   Platelets 262 150 - 440 K/uL   Neutrophils Relative % 77 %   Neutro Abs 6.5 1.4 - 6.5 K/uL   Lymphocytes Relative 12 %   Lymphs Abs 1.0 1.0 - 3.6 K/uL   Monocytes Relative 9 %   Monocytes Absolute 0.8 0.2 - 0.9 K/uL   Eosinophils Relative 1 %   Eosinophils Absolute 0.1 0 - 0.7 K/uL   Basophils Relative 1 %   Basophils Absolute 0.1 0 - 0.1 K/uL  Protime-INR     Status: None   Collection Time: 01/20/17  8:20 PM  Result Value Ref Range   Prothrombin Time 15.2 11.4 - 15.2 seconds   INR 1.19    ____________________________________________  EKG My review and personal interpretation at Time: 20:01   Indication: fall  Rate: 90 Rhythm: afib Axis: normal Other: non specific st changes, no STEMI ____________________________________________  RADIOLOGY  I personally reviewed all radiographic images  ordered to evaluate for the above  acute complaints and reviewed radiology reports and findings.  These findings were personally discussed with the patient.  Please see medical record for radiology report.  ____________________________________________   PROCEDURES  Procedure(s) performed:  Procedures    Critical Care performed: no ____________________________________________   INITIAL IMPRESSION / ASSESSMENT AND PLAN / ED COURSE  Pertinent labs & imaging results that were available during my care of the patient were reviewed by me and considered in my medical decision making (see chart for details).  DDX: sah, sdh, edh, fracture, contusion, soft tissue injury, viscous injury, concussion, hemorrhage   Rachael Horne is a 83 y.o. who presents to the ED with headache and neck pain as described above after mechanical fall. Patient was hemodynamically stable and in no acute distress. Patient's blood pressure is elevated but likely secondary to pain. Will treat pain as well as evaluate for any evidence of acute traumatic injury. Her thorax and abdominal exam is benign therefore do not feel imaging indicated of these. Will order CT head and neck.  The patient will be placed on continuous pulse oximetry and telemetry for monitoring.  Laboratory evaluation will be sent to evaluate for the above complaints.     Clinical Course as of Jan 20 2235  Nancy Fetter Jan 20, 2017  2144 Patient reassessed. Discussed CT findings with patient and family. Blood work is otherwise reassuring. States the headache is improving. Still has some mild soreness to her neck but not consistent with the significant C-spine injury. Likely cervical strain. She has no neuro deficits at this time and appears stable for close follow-up.  Have discussed with the patient and available family all diagnostics and treatments performed thus far and all questions were answered to the best of my ability. The patient demonstrates understanding and agreement with plan.   [PR]     Clinical Course User Index [PR] Merlyn Lot, MD     ____________________________________________   FINAL CLINICAL IMPRESSION(S) / ED DIAGNOSES  Final diagnoses:  Fall, initial encounter  Injury of head, initial encounter  Contusion of scalp, initial encounter      NEW MEDICATIONS STARTED DURING THIS VISIT:  New Prescriptions   No medications on file     Note:  This document was prepared using Dragon voice recognition software and may include unintentional dictation errors.    Merlyn Lot, MD 01/20/17 2238

## 2017-01-20 NOTE — ED Triage Notes (Signed)
Patient presents to Emergency Department via [facility] from home with complaints of fall whilst putting on her sleepwear.  Pt reports strike to back of head pt denies pain in neck or head BUT reports a pressure feeling in head and eyes are "blurry feeling"  Pt with CVA hx and on Eliquis.  Hx of afib, HTN, CHF, DM.

## 2017-02-07 ENCOUNTER — Other Ambulatory Visit: Payer: Self-pay | Admitting: Physician Assistant

## 2017-02-07 DIAGNOSIS — M5416 Radiculopathy, lumbar region: Secondary | ICD-10-CM

## 2017-02-14 ENCOUNTER — Encounter
Admission: RE | Admit: 2017-02-14 | Discharge: 2017-02-14 | Disposition: A | Discharge status: Home / Self Care | Payer: Medicare Other | Source: Ambulatory Visit | Attending: Physician Assistant | Admitting: Physician Assistant

## 2017-02-14 ENCOUNTER — Other Ambulatory Visit: Payer: Self-pay | Admitting: Physician Assistant

## 2017-02-14 DIAGNOSIS — M898X5 Other specified disorders of bone, thigh: Secondary | ICD-10-CM | POA: Insufficient documentation

## 2017-02-14 DIAGNOSIS — M5416 Radiculopathy, lumbar region: Secondary | ICD-10-CM

## 2017-02-14 MED ORDER — TECHNETIUM TC 99M MEDRONATE IV KIT
25.0000 | PACK | Freq: Once | INTRAVENOUS | Status: AC | PRN
  Administered 2017-02-14: 21.11 via INTRAVENOUS

## 2017-04-10 ENCOUNTER — Other Ambulatory Visit: Payer: Self-pay | Admitting: Oncology

## 2017-05-20 ENCOUNTER — Ambulatory Visit
Admission: RE | Admit: 2017-05-20 | Discharge: 2017-05-20 | Disposition: A | Discharge status: Home / Self Care | Payer: Medicare Other | Source: Ambulatory Visit | Attending: Oncology | Admitting: Oncology

## 2017-05-20 DIAGNOSIS — C50411 Malignant neoplasm of upper-outer quadrant of right female breast: Secondary | ICD-10-CM

## 2017-05-20 DIAGNOSIS — Z853 Personal history of malignant neoplasm of breast: Secondary | ICD-10-CM | POA: Insufficient documentation

## 2017-05-20 DIAGNOSIS — Z780 Asymptomatic menopausal state: Secondary | ICD-10-CM | POA: Diagnosis present

## 2017-05-20 DIAGNOSIS — M810 Age-related osteoporosis without current pathological fracture: Secondary | ICD-10-CM | POA: Diagnosis not present

## 2017-06-07 DIAGNOSIS — R0602 Shortness of breath: Secondary | ICD-10-CM | POA: Insufficient documentation

## 2017-06-11 ENCOUNTER — Telehealth: Payer: Self-pay | Admitting: *Deleted

## 2017-06-11 NOTE — Telephone Encounter (Signed)
BMD improved from -2.8 to -2.5.  Still considered osteoporosis.

## 2017-06-11 NOTE — Telephone Encounter (Signed)
Patient called asking for results of BMD and if it got worse or better than last year. Please return her call  [phone]

## 2017-06-12 NOTE — Telephone Encounter (Signed)
Injection?  Does she get prolia?

## 2017-06-12 NOTE — Telephone Encounter (Signed)
Recommend restart fosamax weekly.  Please double check that she is not receiving Prolia or anything else from another provider.  Thanks.

## 2017-06-12 NOTE — Telephone Encounter (Signed)
Patient advised of results and she is inquiring if she needs another injection for her bones or to be on something for her bones, currently she only takes CA+ Vit D

## 2017-06-12 NOTE — Telephone Encounter (Signed)
I do not find anything in our notes that indicate we gave her anything for her bones, The only mention is of the fact she had been on Fosamax and stopped it in 2007 and has had osteoporosis for a number of years.

## 2017-06-12 NOTE — Telephone Encounter (Signed)
Message sent to Dr Doy Hutching office, awaiting response

## 2017-06-12 NOTE — Telephone Encounter (Signed)
Ok to restart Fosamax

## 2017-06-13 MED ORDER — ALENDRONATE SODIUM 70 MG PO TABS: 70.0000 mg | ORAL_TABLET | ORAL | 3 refills | Status: DC

## 2017-06-13 NOTE — Telephone Encounter (Signed)
Med e scribed to pharmacy. Attempted to call patient, get a busy signal

## 2017-06-13 NOTE — Telephone Encounter (Addendum)
Per patient, she got an infusion from Rheumatology in the fall. After searching the [facility] system, I see that she got Reclast 5 mg 09/10/16. I advised her that we are restarting her Fosmax and are not planning to give her IV medication. She is in agreement with this plan

## 2017-07-03 DIAGNOSIS — S8012XA Contusion of left lower leg, initial encounter: Secondary | ICD-10-CM

## 2017-07-03 HISTORY — DX: Contusion of left lower leg, initial encounter: S80.12XA

## 2017-07-18 ENCOUNTER — Other Ambulatory Visit: Payer: Self-pay | Admitting: Internal Medicine

## 2017-07-18 ENCOUNTER — Ambulatory Visit
Admission: RE | Admit: 2017-07-18 | Discharge: 2017-07-18 | Disposition: A | Discharge status: Home / Self Care | Payer: Medicare Other | Source: Ambulatory Visit | Attending: Internal Medicine | Admitting: Internal Medicine

## 2017-07-18 DIAGNOSIS — T148XXA Other injury of unspecified body region, initial encounter: Secondary | ICD-10-CM | POA: Insufficient documentation

## 2017-07-18 DIAGNOSIS — X58XXXA Exposure to other specified factors, initial encounter: Secondary | ICD-10-CM | POA: Insufficient documentation

## 2017-07-18 DIAGNOSIS — M79605 Pain in left leg: Secondary | ICD-10-CM | POA: Diagnosis present

## 2017-07-30 ENCOUNTER — Ambulatory Visit
Admission: RE | Admit: 2017-07-30 | Discharge: 2017-07-30 | Disposition: A | Discharge status: Home / Self Care | Payer: Medicare Other | Source: Ambulatory Visit | Attending: Orthopedic Surgery | Admitting: Orthopedic Surgery

## 2017-07-30 DIAGNOSIS — S8012XA Contusion of left lower leg, initial encounter: Secondary | ICD-10-CM

## 2017-07-30 DIAGNOSIS — X58XXXA Exposure to other specified factors, initial encounter: Secondary | ICD-10-CM | POA: Insufficient documentation

## 2017-07-30 DIAGNOSIS — Z01812 Encounter for preprocedural laboratory examination: Secondary | ICD-10-CM

## 2017-07-30 HISTORY — DX: Unspecified atrial fibrillation: I48.91

## 2017-07-30 HISTORY — DX: Cardiac arrhythmia, unspecified: I49.9

## 2017-07-30 HISTORY — DX: Heart failure, unspecified: I50.9

## 2017-07-30 HISTORY — DX: Anxiety disorder, unspecified: F41.9

## 2017-07-30 HISTORY — DX: Contusion of left lower leg, initial encounter: S80.12XA

## 2017-07-30 HISTORY — DX: Dyspnea, unspecified: R06.00

## 2017-07-30 HISTORY — DX: Unspecified osteoarthritis, unspecified site: M19.90

## 2017-07-30 HISTORY — DX: Gastro-esophageal reflux disease without esophagitis: K21.9

## 2017-07-30 LAB — BASIC METABOLIC PANEL
Anion gap: 11 (ref 5–15)
BUN: 37 mg/dL — AB (ref 6–20)
CALCIUM: 9.4 mg/dL (ref 8.9–10.3)
CO2: 28 mmol/L (ref 22–32)
Chloride: 95 mmol/L — ABNORMAL LOW (ref 101–111)
Creatinine, Ser: 1.15 mg/dL — ABNORMAL HIGH (ref 0.44–1.00)
GFR calc Af Amer: 48 mL/min — ABNORMAL LOW (ref >60)
GFR, EST NON AFRICAN AMERICAN: 42 mL/min — AB (ref >60)
GLUCOSE: 91 mg/dL (ref 65–99)
POTASSIUM: 2.9 mmol/L — AB (ref 3.5–5.1)
Sodium: 134 mmol/L — ABNORMAL LOW (ref 135–145)

## 2017-07-30 NOTE — Patient Instructions (Signed)
Your procedure is scheduled on: August 02, 2017 (FRIDAY )  Report to Same Day Surgery 2nd floor medical mall ([place] Entrance-take elevator on left to 2nd floor.  Check in with surgery information desk.) To find out your arrival time please call [phone] between 1PM - 3PM on August 01, 2017 (THURSDAY ) Remember: Instructions that are not followed completely may result in serious medical risk, up to and including death, or upon the discretion of your surgeon and anesthesiologist your surgery may need to be rescheduled.    _x___ 1. Do not eat food or drink liquids after midnight. No gum chewing or hard candies                              __x__ 2. No Alcohol for 24 hours before or after surgery.   __x__3. No Smoking for 24 prior to surgery.   ____  4. Bring all medications with you on the day of surgery if instructed.    __x__ 5. Notify your doctor if there is any change in your medical condition     (cold, fever, infections).     Do not wear jewelry, make-up, hairpins, clips or nail polish.  Do not wear lotions, powders, or perfumes.   Do not shave 48 hours prior to surgery. Men may shave face and neck.  Do not bring valuables to the hospital.    [facility] is not responsible for any belongings or valuables.               Contacts, dentures or bridgework may not be worn into surgery.  Leave your suitcase in the car. After surgery it may be brought to your room.  For patients admitted to the hospital, discharge time is determined by your treatment team                   Patients discharged the day of surgery will not be allowed to drive home.  You will need someone to drive you home and stay with you the night of your procedure.    Please read over the following fact sheets that you were given:   [facility] Preparing for Surgery and or MRSA Information   TAKE THE FOLLOWING MEDICATION THE MORNING OF SURGERY WITH A SIP OF WATER :  1. DILTIAZEM  2. PANTOPRAZOLE  (PANTOPRAZOLE AT BEDTIME ON AUGUST 30 )  3.  4.  5.  6.  ____Fleets enema or Magnesium Citrate as directed.   _x__ Use CHG Soap or sage wipes as directed on instruction sheet   _X___ Use inhalers on the day of surgery and bring to hospital day of surgery (USE PRO-AIR AND SYMBICORT INHALERS THE MORNING OF SURGERY AND [place])  ____ Stop Metformin and Janumet 2 days prior to surgery.    ____ Take 1/2 of usual insulin dose the night before surgery and none on the morning surgery     _x___ Follow recommendations from Cardiologist, Pulmonologist or PCP regarding          stopping Aspirin, Coumadin, Plavix ,Eliquis, Effient, or Pradaxa, and Pletal. ( STOP ELIQUIS TONIGHT )  X____Stop Anti-inflammatories such as Advil, Aleve, Ibuprofen, Motrin, Naproxen, Naprosyn, Goodies powders or aspirin products. OK to take Tylenol                           _x___ Stop supplements until after surgery.  But may continue Vitamin D, Vitamin B, and multivitamin   ____ Bring C-Pap to the hospital.

## 2017-07-31 NOTE — Pre-Procedure Instructions (Signed)
Met B results sent to Dr. Leim Fabry and Anesthesia for review.

## 2017-07-31 NOTE — H&P (Signed)
Spoke with misty at dr patel's and informed needs kt started as soon as possible. Will recheck am surgery

## 2017-08-02 ENCOUNTER — Inpatient Hospital Stay: Payer: Medicare Other | Admitting: Anesthesiology

## 2017-08-02 ENCOUNTER — Inpatient Hospital Stay
Admission: RE | Admit: 2017-08-02 | Discharge: 2017-08-05 | DRG: 572 | Disposition: A | Discharge status: Skilled Nursing Facility | Payer: Medicare Other | Source: Ambulatory Visit | Attending: Orthopedic Surgery | Admitting: Orthopedic Surgery

## 2017-08-02 ENCOUNTER — Encounter: Payer: Self-pay | Admitting: *Deleted

## 2017-08-02 ENCOUNTER — Encounter
Admission: RE | Disposition: A | Discharge status: Skilled Nursing Facility | Payer: Self-pay | Source: Ambulatory Visit | Attending: Orthopedic Surgery

## 2017-08-02 DIAGNOSIS — F419 Anxiety disorder, unspecified: Secondary | ICD-10-CM | POA: Diagnosis present

## 2017-08-02 DIAGNOSIS — S8012XA Contusion of left lower leg, initial encounter: Principal | ICD-10-CM | POA: Diagnosis present

## 2017-08-02 DIAGNOSIS — I2510 Atherosclerotic heart disease of native coronary artery without angina pectoris: Secondary | ICD-10-CM | POA: Diagnosis present

## 2017-08-02 DIAGNOSIS — E876 Hypokalemia: Secondary | ICD-10-CM | POA: Diagnosis not present

## 2017-08-02 DIAGNOSIS — I110 Hypertensive heart disease with heart failure: Secondary | ICD-10-CM | POA: Diagnosis present

## 2017-08-02 DIAGNOSIS — K219 Gastro-esophageal reflux disease without esophagitis: Secondary | ICD-10-CM | POA: Diagnosis present

## 2017-08-02 DIAGNOSIS — E119 Type 2 diabetes mellitus without complications: Secondary | ICD-10-CM | POA: Diagnosis present

## 2017-08-02 DIAGNOSIS — E785 Hyperlipidemia, unspecified: Secondary | ICD-10-CM | POA: Diagnosis present

## 2017-08-02 DIAGNOSIS — I509 Heart failure, unspecified: Secondary | ICD-10-CM | POA: Diagnosis present

## 2017-08-02 DIAGNOSIS — W1809XA Striking against other object with subsequent fall, initial encounter: Secondary | ICD-10-CM | POA: Diagnosis present

## 2017-08-02 DIAGNOSIS — Z86718 Personal history of other venous thrombosis and embolism: Secondary | ICD-10-CM

## 2017-08-02 DIAGNOSIS — Z853 Personal history of malignant neoplasm of breast: Secondary | ICD-10-CM | POA: Diagnosis not present

## 2017-08-02 DIAGNOSIS — Z8673 Personal history of transient ischemic attack (TIA), and cerebral infarction without residual deficits: Secondary | ICD-10-CM | POA: Diagnosis not present

## 2017-08-02 DIAGNOSIS — Z01812 Encounter for preprocedural laboratory examination: Secondary | ICD-10-CM

## 2017-08-02 DIAGNOSIS — D649 Anemia, unspecified: Secondary | ICD-10-CM | POA: Diagnosis present

## 2017-08-02 DIAGNOSIS — J449 Chronic obstructive pulmonary disease, unspecified: Secondary | ICD-10-CM | POA: Diagnosis present

## 2017-08-02 DIAGNOSIS — I4891 Unspecified atrial fibrillation: Secondary | ICD-10-CM | POA: Diagnosis present

## 2017-08-02 DIAGNOSIS — S8010XA Contusion of unspecified lower leg, initial encounter: Secondary | ICD-10-CM | POA: Diagnosis present

## 2017-08-02 HISTORY — PX: APPLICATION OF WOUND VAC: SHX5189

## 2017-08-02 HISTORY — PX: I & D EXTREMITY: SHX5045

## 2017-08-02 LAB — POCT I-STAT 4, (NA,K, GLUC, HGB,HCT)
GLUCOSE: 119 mg/dL — AB (ref 65–99)
Glucose, Bld: 117 mg/dL — ABNORMAL HIGH (ref 65–99)
HCT: 38 % (ref 36.0–46.0)
HEMATOCRIT: 35 % — AB (ref 36.0–46.0)
HEMOGLOBIN: 12.9 g/dL (ref 12.0–15.0)
HEMOGLOBIN: 11.9 g/dL — AB (ref 12.0–15.0)
POTASSIUM: 2.8 mmol/L — AB (ref 3.5–5.1)
Potassium: 3.4 mmol/L — ABNORMAL LOW (ref 3.5–5.1)
SODIUM: 137 mmol/L (ref 135–145)
SODIUM: 135 mmol/L (ref 135–145)

## 2017-08-02 LAB — GLUCOSE, CAPILLARY
GLUCOSE-CAPILLARY: 109 mg/dL — AB (ref 65–99)
GLUCOSE-CAPILLARY: 129 mg/dL — AB (ref 65–99)
Glucose-Capillary: 117 mg/dL — ABNORMAL HIGH (ref 65–99)

## 2017-08-02 SURGERY — IRRIGATION AND DEBRIDEMENT EXTREMITY
Anesthesia: General | Laterality: Left

## 2017-08-02 MED ORDER — DILTIAZEM HCL ER COATED BEADS 240 MG PO CP24
240.0000 mg | ORAL_CAPSULE | Freq: Every day | ORAL | Status: DC
  Administered 2017-08-03 – 2017-08-05 (×3): 240 mg via ORAL
  Filled 2017-08-02 (×6): qty 1

## 2017-08-02 MED ORDER — SALINE SPRAY 0.65 % NA SOLN
1.0000 | NASAL | Status: DC | PRN
  Filled 2017-08-02: qty 44

## 2017-08-02 MED ORDER — FENTANYL CITRATE (PF) 100 MCG/2ML IJ SOLN
INTRAMUSCULAR | Status: AC
Start: 2017-08-02 — End: 2017-08-02
  Administered 2017-08-02: 25 ug via INTRAVENOUS
  Filled 2017-08-02: qty 2

## 2017-08-02 MED ORDER — MAGNESIUM SULFATE 2 GM/50ML IV SOLN
2.0000 g | Freq: Once | INTRAVENOUS | Status: AC
  Administered 2017-08-02: 2 g via INTRAVENOUS
  Filled 2017-08-02: qty 50

## 2017-08-02 MED ORDER — POTASSIUM CHLORIDE IN NACL 40-0.9 MEQ/L-% IV SOLN
INTRAVENOUS | Status: DC
  Administered 2017-08-02 – 2017-08-04 (×3): 50 mL/h via INTRAVENOUS
  Filled 2017-08-02 (×5): qty 1000

## 2017-08-02 MED ORDER — DOCUSATE SODIUM 100 MG PO CAPS
100.0000 mg | ORAL_CAPSULE | Freq: Two times a day (BID) | ORAL | Status: DC
  Administered 2017-08-02 – 2017-08-05 (×4): 100 mg via ORAL
  Filled 2017-08-02 (×5): qty 1

## 2017-08-02 MED ORDER — EPHEDRINE SULFATE 50 MG/ML IJ SOLN
INTRAMUSCULAR | Status: AC
  Filled 2017-08-02: qty 1

## 2017-08-02 MED ORDER — ALBUTEROL SULFATE (2.5 MG/3ML) 0.083% IN NEBU: 3.0000 mL | INHALATION_SOLUTION | Freq: Four times a day (QID) | RESPIRATORY_TRACT | Status: DC | PRN

## 2017-08-02 MED ORDER — MOMETASONE FURO-FORMOTEROL FUM 200-5 MCG/ACT IN AERO
2.0000 | INHALATION_SPRAY | Freq: Two times a day (BID) | RESPIRATORY_TRACT | Status: DC
  Administered 2017-08-02 – 2017-08-05 (×6): 2 via RESPIRATORY_TRACT
  Filled 2017-08-02: qty 8.8

## 2017-08-02 MED ORDER — PANTOPRAZOLE SODIUM 40 MG PO TBEC: 40.0000 mg | DELAYED_RELEASE_TABLET | ORAL | Status: DC | PRN

## 2017-08-02 MED ORDER — POTASSIUM CHLORIDE CRYS ER 10 MEQ PO TBCR
10.0000 meq | EXTENDED_RELEASE_TABLET | Freq: Every day | ORAL | Status: DC
  Administered 2017-08-02 – 2017-08-05 (×4): 10 meq via ORAL
  Filled 2017-08-02 (×7): qty 1

## 2017-08-02 MED ORDER — SENNOSIDES-DOCUSATE SODIUM 8.6-50 MG PO TABS
1.0000 | ORAL_TABLET | Freq: Two times a day (BID) | ORAL | Status: DC | PRN
  Administered 2017-08-03: 1 via ORAL
  Filled 2017-08-02: qty 1

## 2017-08-02 MED ORDER — PROPOFOL 10 MG/ML IV BOLUS
INTRAVENOUS | Status: AC
  Filled 2017-08-02: qty 20

## 2017-08-02 MED ORDER — GLIMEPIRIDE 4 MG PO TABS
4.0000 mg | ORAL_TABLET | Freq: Every day | ORAL | Status: DC
  Administered 2017-08-03 – 2017-08-05 (×3): 4 mg via ORAL
  Filled 2017-08-02 (×3): qty 1

## 2017-08-02 MED ORDER — ACETAMINOPHEN 500 MG PO TABS
1000.0000 mg | ORAL_TABLET | Freq: Three times a day (TID) | ORAL | Status: DC
  Administered 2017-08-02 – 2017-08-05 (×9): 1000 mg via ORAL
  Filled 2017-08-02 (×9): qty 2

## 2017-08-02 MED ORDER — FENTANYL CITRATE (PF) 100 MCG/2ML IJ SOLN
INTRAMUSCULAR | Status: AC
  Filled 2017-08-02: qty 2

## 2017-08-02 MED ORDER — FUROSEMIDE 40 MG PO TABS
40.0000 mg | ORAL_TABLET | Freq: Two times a day (BID) | ORAL | Status: DC
  Administered 2017-08-02 – 2017-08-05 (×5): 40 mg via ORAL
  Filled 2017-08-02 (×6): qty 1

## 2017-08-02 MED ORDER — HYDROMORPHONE HCL 1 MG/ML IJ SOLN: 0.2000 mg | INTRAMUSCULAR | Status: DC | PRN

## 2017-08-02 MED ORDER — POTASSIUM CHLORIDE 10 MEQ/100ML IV SOLN
10.0000 meq | INTRAVENOUS | Status: AC
  Administered 2017-08-02 (×4): 10 meq via INTRAVENOUS
  Filled 2017-08-02 (×4): qty 100

## 2017-08-02 MED ORDER — ONDANSETRON HCL 4 MG/2ML IJ SOLN
INTRAMUSCULAR | Status: DC | PRN
Start: 2017-08-02 — End: 2017-08-02
  Administered 2017-08-02: 4 mg via INTRAVENOUS

## 2017-08-02 MED ORDER — FENTANYL CITRATE (PF) 100 MCG/2ML IJ SOLN
INTRAMUSCULAR | Status: DC | PRN
  Administered 2017-08-02 (×4): 25 ug via INTRAVENOUS

## 2017-08-02 MED ORDER — FLUTICASONE PROPIONATE 50 MCG/ACT NA SUSP
2.0000 | NASAL | Status: DC | PRN
  Filled 2017-08-02 (×2): qty 16

## 2017-08-02 MED ORDER — CLINDAMYCIN PHOSPHATE 900 MG/50ML IV SOLN
INTRAVENOUS | Status: DC | PRN
  Administered 2017-08-02: 900 mg via INTRAVENOUS

## 2017-08-02 MED ORDER — FENTANYL CITRATE (PF) 100 MCG/2ML IJ SOLN
25.0000 ug | INTRAMUSCULAR | Status: DC | PRN
  Administered 2017-08-02 (×4): 25 ug via INTRAVENOUS

## 2017-08-02 MED ORDER — LORATADINE 10 MG PO TABS: 10.0000 mg | ORAL_TABLET | Freq: Every day | ORAL | Status: DC | PRN

## 2017-08-02 MED ORDER — PHENYLEPHRINE HCL 10 MG/ML IJ SOLN
INTRAMUSCULAR | Status: AC
  Filled 2017-08-02: qty 1

## 2017-08-02 MED ORDER — CLINDAMYCIN PHOSPHATE 900 MG/50ML IV SOLN
INTRAVENOUS | Status: AC
  Filled 2017-08-02: qty 50

## 2017-08-02 MED ORDER — ANASTROZOLE 1 MG PO TABS
1.0000 mg | ORAL_TABLET | Freq: Every day | ORAL | Status: DC
  Administered 2017-08-02 – 2017-08-05 (×4): 1 mg via ORAL
  Filled 2017-08-02 (×5): qty 1

## 2017-08-02 MED ORDER — DEXAMETHASONE SODIUM PHOSPHATE 10 MG/ML IJ SOLN
INTRAMUSCULAR | Status: DC | PRN
  Administered 2017-08-02: 5 mg via INTRAVENOUS

## 2017-08-02 MED ORDER — LIDOCAINE HCL (PF) 2 % IJ SOLN
INTRAMUSCULAR | Status: AC
  Filled 2017-08-02: qty 2

## 2017-08-02 MED ORDER — MIDAZOLAM HCL 2 MG/2ML IJ SOLN
INTRAMUSCULAR | Status: AC
  Filled 2017-08-02: qty 2

## 2017-08-02 MED ORDER — MONTELUKAST SODIUM 10 MG PO TABS
10.0000 mg | ORAL_TABLET | Freq: Every day | ORAL | Status: DC
  Administered 2017-08-02 – 2017-08-04 (×3): 10 mg via ORAL
  Filled 2017-08-02 (×3): qty 1

## 2017-08-02 MED ORDER — LIDOCAINE HCL (CARDIAC) 20 MG/ML IV SOLN
INTRAVENOUS | Status: DC | PRN
  Administered 2017-08-02: 100 mg via INTRAVENOUS

## 2017-08-02 MED ORDER — PROPOFOL 10 MG/ML IV BOLUS
INTRAVENOUS | Status: DC | PRN
  Administered 2017-08-02: 100 mg via INTRAVENOUS
  Administered 2017-08-02: 40 mg via INTRAVENOUS

## 2017-08-02 MED ORDER — APIXABAN 5 MG PO TABS
5.0000 mg | ORAL_TABLET | Freq: Two times a day (BID) | ORAL | Status: DC
  Administered 2017-08-04 – 2017-08-05 (×3): 5 mg via ORAL
  Filled 2017-08-02 (×3): qty 1

## 2017-08-02 MED ORDER — MAGNESIUM OXIDE 400 (241.3 MG) MG PO TABS
400.0000 mg | ORAL_TABLET | Freq: Every day | ORAL | Status: DC
  Administered 2017-08-02 – 2017-08-05 (×4): 400 mg via ORAL
  Filled 2017-08-02 (×4): qty 1

## 2017-08-02 MED ORDER — METOLAZONE 2.5 MG PO TABS
2.5000 mg | ORAL_TABLET | ORAL | Status: DC
  Administered 2017-08-05: 2.5 mg via ORAL
  Filled 2017-08-02: qty 1

## 2017-08-02 MED ORDER — PRAVASTATIN SODIUM 40 MG PO TABS
40.0000 mg | ORAL_TABLET | Freq: Every day | ORAL | Status: DC
  Administered 2017-08-02: 40 mg via ORAL
  Filled 2017-08-02: qty 2
  Filled 2017-08-02: qty 1
  Filled 2017-08-02 (×2): qty 2
  Filled 2017-08-02 (×3): qty 1

## 2017-08-02 MED ORDER — SODIUM CHLORIDE 0.9 % IV SOLN: INTRAVENOUS | Status: DC

## 2017-08-02 MED ORDER — DABIGATRAN ETEXILATE MESYLATE 150 MG PO CAPS: 150.0000 mg | ORAL_CAPSULE | Freq: Two times a day (BID) | ORAL | Status: DC

## 2017-08-02 MED ORDER — SODIUM CHLORIDE 0.9 % IV SOLN
INTRAVENOUS | Status: DC | PRN
  Administered 2017-08-02: 18:00:00 via INTRAVENOUS

## 2017-08-02 MED ORDER — ALPRAZOLAM 0.25 MG PO TABS
0.1250 mg | ORAL_TABLET | Freq: Every evening | ORAL | Status: DC | PRN
  Administered 2017-08-03: 0.125 mg via ORAL
  Filled 2017-08-02: qty 1

## 2017-08-02 MED ORDER — ACETAMINOPHEN 10 MG/ML IV SOLN
INTRAVENOUS | Status: AC
  Filled 2017-08-02: qty 100

## 2017-08-02 MED ORDER — ONDANSETRON HCL 4 MG/2ML IJ SOLN: 4.0000 mg | Freq: Four times a day (QID) | INTRAMUSCULAR | Status: DC | PRN

## 2017-08-02 MED ORDER — ONDANSETRON HCL 4 MG PO TABS: 4.0000 mg | ORAL_TABLET | Freq: Four times a day (QID) | ORAL | Status: DC | PRN

## 2017-08-02 MED ORDER — ALENDRONATE SODIUM 70 MG PO TABS: 70.0000 mg | ORAL_TABLET | ORAL | Status: DC

## 2017-08-02 MED ORDER — CLINDAMYCIN PHOSPHATE 600 MG/50ML IV SOLN
600.0000 mg | Freq: Three times a day (TID) | INTRAVENOUS | Status: DC
  Administered 2017-08-02 – 2017-08-05 (×7): 600 mg via INTRAVENOUS
  Filled 2017-08-02 (×11): qty 50

## 2017-08-02 MED ORDER — OXYCODONE HCL 5 MG PO TABS
5.0000 mg | ORAL_TABLET | Freq: Four times a day (QID) | ORAL | Status: DC | PRN
  Administered 2017-08-02 – 2017-08-05 (×4): 5 mg via ORAL
  Filled 2017-08-02 (×5): qty 1

## 2017-08-02 MED ORDER — ADULT MULTIVITAMIN W/MINERALS CH
1.0000 | ORAL_TABLET | Freq: Every day | ORAL | Status: DC
  Administered 2017-08-03 – 2017-08-05 (×3): 1 via ORAL
  Filled 2017-08-02 (×3): qty 1

## 2017-08-02 MED ORDER — SENNOSIDES-DOCUSATE SODIUM 8.6-50 MG PO TABS
1.0000 | ORAL_TABLET | Freq: Every evening | ORAL | Status: DC | PRN
  Administered 2017-08-03 – 2017-08-04 (×2): 1 via ORAL
  Filled 2017-08-02 (×2): qty 1

## 2017-08-02 MED ORDER — ONDANSETRON HCL 4 MG/2ML IJ SOLN: 4.0000 mg | Freq: Once | INTRAMUSCULAR | Status: DC | PRN

## 2017-08-02 SURGICAL SUPPLY — 24 items
CANISTER SUCT 1200ML W/VALVE (MISCELLANEOUS) ×2 IMPLANT
CHLORAPREP W/TINT 10.5 ML (MISCELLANEOUS) ×2 IMPLANT
CUFF TOURN 24 STER (MISCELLANEOUS) ×2 IMPLANT
DRSG VAC ATS MED SENSATRAC (GAUZE/BANDAGES/DRESSINGS) ×2 IMPLANT
ELECT REM PT RETURN 9FT ADLT (ELECTROSURGICAL) ×2
ELECTRODE REM PT RTRN 9FT ADLT (ELECTROSURGICAL) ×1 IMPLANT
GAUZE PETRO XEROFOAM 1X8 (MISCELLANEOUS) IMPLANT
GAUZE SPONGE 4X4 12PLY STRL (GAUZE/BANDAGES/DRESSINGS) ×2 IMPLANT
GLOVE BIOGEL PI IND STRL 8 (GLOVE) ×1 IMPLANT
GLOVE BIOGEL PI INDICATOR 8 (GLOVE) ×1
GLOVE SURG SYN 7.5  E (GLOVE) ×1
GLOVE SURG SYN 7.5 E (GLOVE) ×1 IMPLANT
GOWN STRL REUS W/ TWL LRG LVL3 (GOWN DISPOSABLE) ×1 IMPLANT
GOWN STRL REUS W/ TWL XL LVL3 (GOWN DISPOSABLE) ×1 IMPLANT
GOWN STRL REUS W/TWL LRG LVL3 (GOWN DISPOSABLE) ×1
GOWN STRL REUS W/TWL XL LVL3 (GOWN DISPOSABLE) ×1
KIT RM TURNOVER STRD PROC AR (KITS) ×2 IMPLANT
NS IRRIG 500ML POUR BTL (IV SOLUTION) ×2 IMPLANT
PACK EXTREMITY ARMC (MISCELLANEOUS) ×2 IMPLANT
PULSAVAC PLUS IRRIG FAN TIP (DISPOSABLE) ×2
STOCKINETTE STRL 6IN 960660 (GAUZE/BANDAGES/DRESSINGS) ×2 IMPLANT
SWAB CULTURE AMIES ANAERIB BLU (MISCELLANEOUS) ×2 IMPLANT
TIP FAN IRRIG PULSAVAC PLUS (DISPOSABLE) ×1 IMPLANT
WND VAC CANISTER 500ML (MISCELLANEOUS) ×2 IMPLANT

## 2017-08-02 NOTE — H&P (Signed)
Paper H&P to be scanned into permanent record. H&P reviewed. No significant changes noted.  

## 2017-08-02 NOTE — Anesthesia Post-op Follow-up Note (Signed)
Anesthesia QCDR form completed.        

## 2017-08-02 NOTE — Anesthesia Procedure Notes (Signed)
Procedure Name: LMA Insertion Performed by: Aline Brochure Pre-anesthesia Checklist: Patient identified, Patient being monitored, Timeout performed, Emergency Drugs available and Suction available Patient Re-evaluated:Patient Re-evaluated prior to induction Oxygen Delivery Method: Circle system utilized Preoxygenation: Pre-oxygenation with 100% oxygen Induction Type: IV induction Ventilation: Mask ventilation without difficulty LMA: LMA inserted LMA Size: 3.5 Tube type: Oral Number of attempts: 1 Placement Confirmation: positive ETCO2 and breath sounds checked- equal and bilateral Tube secured with: Tape Dental Injury: Teeth and Oropharynx as per pre-operative assessment

## 2017-08-02 NOTE — Progress Notes (Signed)
PHARMACIST - PHYSICIAN COMMUNICATION  CONCERNING: P&T Medication Policy Regarding Oral Bisphosphonates  RECOMMENDATION: Your order for alendronate (Fosamax), ibandronate (Boniva), or risedronate (Actonel) has been discontinued at this time.  If the patient's post-hospital medical condition warrants safe use of this class of drugs, please resume the pre-hospital regimen upon discharge.  DESCRIPTION:  Alendronate (Fosamax), ibandronate (Boniva), and risedronate (Actonel) can cause severe esophageal erosions in patients who are unable to remain upright at least 30 minutes after taking this medication.   Since brief interruptions in therapy are thought to have minimal impact on bone mineral density, the [place] has established that bisphosphonate orders should be routinely discontinued during hospitalization.   To override this safety policy and permit administration of Boniva, Fosamax, or Actonel in the hospital, prescribers must write "DO NOT HOLD" in the comments section when placing the order for this class of medications.  Pernell Dupre, PharmD, BCPS Clinical Pharmacist 08/02/2017 8:04 PM

## 2017-08-02 NOTE — NC FL2 (Signed)
[place] LEVEL OF CARE SCREENING TOOL     IDENTIFICATION  Patient Name: Rachael Horne Birthdate: Dec 29, 1927 Sex: female Admission Date (Current Location): 08/02/2017  [place] and [place] Number:  Engineering geologist and Address:  [facility], [address], [place], [place] [address]      Provider Number: [id number]  Attending Physician Name and Address:  Leim Fabry, MD  Relative Name and Phone Number:       Current Level of Care: Hospital Recommended Level of Care: [place] Prior Approval Number:    Date Approved/Denied:   PASRR Number:  ([id number] )  Discharge Plan: SNF    Current Diagnoses: Patient Active Problem List   Diagnosis Date Noted  . Hematoma of leg 08/02/2017  . Sepsis ([place]) 11/03/2015  . Acute bronchitis 11/03/2015  . Fever 11/03/2015  . Atrial fibrillation with RVR ([place]) 11/03/2015  . A-fib ([place]) 08/22/2015  . Primary cancer of upper outer quadrant of right female breast ([place]) 08/22/2015  . Diabetes mellitus, type 2 ([place]) 08/22/2015  . Allergy to environmental factors 08/22/2015  . History of knee problem 08/22/2015  . HLD (hyperlipidemia) 08/22/2015  . BP (high blood pressure) 08/22/2015  . MI (mitral incompetence) 08/22/2015  . Arthritis of hand, degenerative 08/22/2015  . TI (tricuspid incompetence) 08/22/2015  . Bradycardia 08/23/2014  . Arteriosclerosis of coronary artery 08/23/2014  . History of asthma 08/23/2014  . H/O deep venous thrombosis 08/23/2014  . Arthritis, degenerative 08/23/2014  . Adiposity 08/23/2014  . Temporary cerebral vascular dysfunction 08/23/2014  . Awareness of heartbeats 08/23/2014  . Absolute anemia 05/06/2014  . Cerebrovascular accident, old 07/03/1997    Orientation RESPIRATION BLADDER Height & Weight     Self, Time, Situation, Place  Normal Continent Weight:   Height:     BEHAVIORAL SYMPTOMS/MOOD NEUROLOGICAL BOWEL NUTRITION STATUS   (none)   (none) Continent Diet (Regular Diet )  AMBULATORY STATUS COMMUNICATION OF NEEDS Skin   Extensive Assist Verbally Surgical wounds                       Personal Care Assistance Level of Assistance  Bathing, Feeding, Dressing Bathing Assistance: Limited assistance Feeding assistance: Independent Dressing Assistance: Limited assistance     Functional Limitations Info  Sight, Hearing, Speech Sight Info: Adequate Hearing Info: Adequate Speech Info: Adequate    SPECIAL CARE FACTORS FREQUENCY  PT (By licensed PT), OT (By licensed OT)     PT Frequency:  (5) OT Frequency:  (5)            Contractures      Additional Factors Info  Code Status, Allergies Code Status Info:  (Full Code. ) Allergies Info:  (Amoxicillin)           Current Medications (08/02/2017):  This is the current hospital active medication list Current Facility-Administered Medications  Medication Dose Route Frequency Provider Last Rate Last Dose  . 0.9 %  sodium chloride infusion   Intravenous Continuous Molli Barrows, MD      . 0.9 % NaCl with KCl 40 mEq / L  infusion   Intravenous Continuous Leim Fabry, MD      . acetaminophen (TYLENOL) tablet 1,000 mg  1,000 mg Oral TID Leim Fabry, MD      . albuterol (PROVENTIL HFA;VENTOLIN HFA) 108 (90 Base) MCG/ACT inhaler 2 puff  2 puff Inhalation Q6H PRN Leim Fabry, MD      . alendronate (FOSAMAX) tablet 70  mg  70 mg Oral Weekly Leim Fabry, MD      . ALPRAZolam Duanne Moron) tablet 0.125 mg  0.125 mg Oral QHS PRN Leim Fabry, MD      . anastrozole (ARIMIDEX) tablet 1 mg  1 mg Oral Daily Leim Fabry, MD      . apixaban Arne Cleveland) tablet 5 mg  5 mg Oral BID Leim Fabry, MD      . Derrill Memo ON 08/04/2017] dabigatran (PRADAXA) capsule 150 mg  150 mg Oral BID Leim Fabry, MD      . diltiazem (DILACOR XR) 24 hr capsule 240 mg  240 mg Oral Daily Leim Fabry, MD      . docusate sodium (COLACE) capsule 100 mg  100 mg Oral BID Leim Fabry, MD      . fluticasone  [facility]) 50 MCG/ACT nasal spray 2 spray  2 spray Each Nare PRN Leim Fabry, MD      . furosemide (LASIX) tablet 40 mg  40 mg Oral BID Leim Fabry, MD      . glimepiride (AMARYL) tablet 4 mg  4 mg Oral Daily Leim Fabry, MD      . HYDROmorphone (DILAUDID) injection 0.2 mg  0.2 mg Intravenous Q4H PRN Leim Fabry, MD      . loratadine (CLARITIN) tablet 10 mg  10 mg Oral Daily PRN Leim Fabry, MD      . magnesium oxide (MAG-OX) tablet 400 mg  400 mg Oral Q1200 Leim Fabry, MD      . metolazone (ZAROXOLYN) tablet 2.5 mg  2.5 mg Oral Once per day on Mon Wed Fri Patel, Tarry Kos, MD      . mometasone-formoterol [facility]) 200-5 MCG/ACT inhaler 2 puff  2 puff Inhalation BID Leim Fabry, MD      . montelukast (SINGULAIR) tablet 10 mg  10 mg Oral QHS Leim Fabry, MD      . multivitamin with minerals tablet 1 tablet  1 tablet Oral Q1200 Leim Fabry, MD      . ondansetron [facility]) tablet 4 mg  4 mg Oral Q6H PRN Leim Fabry, MD       Or  . ondansetron [facility]) injection 4 mg  4 mg Intravenous Q6H PRN Leim Fabry, MD      . oxyCODONE (Oxy IR/ROXICODONE) immediate release tablet 5 mg  5 mg Oral Q6H PRN Leim Fabry, MD      . pantoprazole (PROTONIX) EC tablet 40 mg  40 mg Oral PRN Leim Fabry, MD      . potassium chloride (K-DUR) CR tablet 10 mEq  10 mEq Oral Q1200 Leim Fabry, MD      . pravastatin (PRAVACHOL) tablet 40 mg  40 mg Oral q1800 Leim Fabry, MD      . senna-docusate (Senokot-S) tablet 1 tablet  1 tablet Oral QHS PRN Leim Fabry, MD      . senna-docusate (Senokot-S) tablet 1 tablet  1 tablet Oral BID PRN Leim Fabry, MD      . sodium chloride (OCEAN) 0.65 % nasal spray 1 spray  1 spray Each Nare PRN Leim Fabry, MD         Discharge Medications: Please see discharge summary for a list of discharge medications.  Relevant Imaging Results:  Relevant Lab Results:   Additional Information  (SSN: [id number])  Tahira Olivarez, Veronia Beets, LCSW

## 2017-08-02 NOTE — Transfer of Care (Signed)
Immediate Anesthesia Transfer of Care Note  Patient: Rachael Horne  Procedure(s) Performed: Procedure(s): I & D LEFT LEG HEMATOMA (Left) APPLICATION OF WOUND VAC (Left)  Patient Location: PACU  Anesthesia Type:General  Level of Consciousness: sedated  Airway & Oxygen Therapy: Patient Spontanous Breathing and Patient connected to face mask oxygen  Post-op Assessment: Report given to RN and Post -op Vital signs reviewed and stable  Post vital signs: Reviewed and stable  Last Vitals:  Vitals:   08/02/17 1850 08/02/17 1852  BP: (!) 151/65 (!) 151/65  Pulse: 76   Resp: 10 10  Temp: 36.8 C 36.8 C  SpO2: 100% 100%    Last Pain:  Vitals:   08/02/17 1852  TempSrc: Temporal         Complications: No apparent anesthesia complications

## 2017-08-02 NOTE — Anesthesia Preprocedure Evaluation (Signed)
Anesthesia Evaluation  Patient identified by MRN, date of birth, ID band Patient awake    Reviewed: Allergy & Precautions, NPO status , Patient's Chart, lab work & pertinent test results  History of Anesthesia Complications Negative for: history of anesthetic complications  Airway Mallampati: III       Dental   Pulmonary COPD,  COPD inhaler,           Cardiovascular hypertension, Pt. on medications + CAD and +CHF  + dysrhythmias Atrial Fibrillation      Neuro/Psych Anxiety CVA (r facial droop, slurred speech, no residual), No Residual Symptoms    GI/Hepatic Neg liver ROS, GERD  Medicated,  Endo/Other  diabetes, Type 2, Oral Hypoglycemic Agents  Renal/GU negative Renal ROS     Musculoskeletal   Abdominal   Peds  Hematology  (+) anemia ,   Anesthesia Other Findings   Reproductive/Obstetrics                             Anesthesia Physical Anesthesia Plan  ASA: III  Anesthesia Plan: General   Post-op Pain Management:    Induction: Intravenous  PONV Risk Score and Plan:   Airway Management Planned: LMA  Additional Equipment:   Intra-op Plan:   Post-operative Plan:   Informed Consent: I have reviewed the patients History and Physical, chart, labs and discussed the procedure including the risks, benefits and alternatives for the proposed anesthesia with the patient or authorized representative who has indicated his/her understanding and acceptance.     Plan Discussed with:   Anesthesia Plan Comments:         Anesthesia Quick Evaluation

## 2017-08-03 LAB — CBC
HEMATOCRIT: 34.8 % — AB (ref 35.0–47.0)
HEMOGLOBIN: 11.8 g/dL — AB (ref 12.0–16.0)
MCH: 32.2 pg (ref 26.0–34.0)
MCHC: 34.1 g/dL (ref 32.0–36.0)
MCV: 94.6 fL (ref 80.0–100.0)
Platelets: 343 10*3/uL (ref 150–440)
RBC: 3.68 MIL/uL — AB (ref 3.80–5.20)
RDW: 15.3 % — ABNORMAL HIGH (ref 11.5–14.5)
WBC: 5.9 10*3/uL (ref 3.6–11.0)

## 2017-08-03 LAB — BASIC METABOLIC PANEL
ANION GAP: 11 (ref 5–15)
BUN: 26 mg/dL — ABNORMAL HIGH (ref 6–20)
CHLORIDE: 98 mmol/L — AB (ref 101–111)
CO2: 25 mmol/L (ref 22–32)
CREATININE: 0.9 mg/dL (ref 0.44–1.00)
Calcium: 8.5 mg/dL — ABNORMAL LOW (ref 8.9–10.3)
GFR calc non Af Amer: 56 mL/min — ABNORMAL LOW (ref >60)
Glucose, Bld: 276 mg/dL — ABNORMAL HIGH (ref 65–99)
POTASSIUM: 3.4 mmol/L — AB (ref 3.5–5.1)
SODIUM: 134 mmol/L — AB (ref 135–145)

## 2017-08-03 MED ORDER — LOSARTAN POTASSIUM 25 MG PO TABS
25.0000 mg | ORAL_TABLET | Freq: Every day | ORAL | Status: DC
  Administered 2017-08-03 – 2017-08-05 (×3): 25 mg via ORAL
  Filled 2017-08-03 (×3): qty 1

## 2017-08-03 MED ORDER — INSULIN ASPART 100 UNIT/ML ~~LOC~~ SOLN: 0.0000 [IU] | Freq: Every day | SUBCUTANEOUS | Status: DC

## 2017-08-03 NOTE — Progress Notes (Signed)
New Admission Note:   Arrival Method: per bed from PACU accompanied by PACU RN  Mental Orientation: alert and oriented X4 Telemetry: none ordered Assessment: Completed Skin: warm, dry with abrasion noted on the left arm, wound vac on continuous pressure intact on the left lower leg, ecchymosis noted on the left ankle, foot and toes sustained from previous fall per pt and family member. Prophylactic sacral foam applied. IV: G20 on the left forearm with transparent dressing, intact, fluids infusing well. Pain: 5/10 scale on the operative site of the left lower leg. Will administer PRN pain medicine. Safety Measures: Safety Fall Prevention Plan has been given and discussed Admission: Completed 1A Orientation: Patient has been oriented to the room, unit and staff.  Family: son [name] and [place] Debbie at bedside  Orders have been reviewed and implemented. Call light has been placed within reach, yellow socks on, fall risk armband applied and bed alarm activated. Will continue to monitor patient.   Georgeanna Harrison, RN [facility] 1A

## 2017-08-03 NOTE — Op Note (Addendum)
DATE: 08/02/17  PRE-OP DIAGNOSIS:  Hematoma of left lower leg  POST-OP DIAGNOSIS:  Hematoma of left lower leg  PROCEDURES:  1. Irrigation and debridement of deep wound and hematoma of left lower leg 2. Application of wound vacuum assisted closure (VAC) device  SURGEON:  Langston Reusing, MD  ASSISTANT(S):  none  ANESTHESIA: Gen   TOTAL IV FLUIDS: See anesthesia record  ESTIMATED BLOOD LOSS: 25cc  TOURNIQUET TIME:  None.  DRAINS: Wound VAC  SPECIMENS: Culture sample  IMPLANTS: None.  COMPLICATIONS: none  INDICATIONS: Rachael Horne is a 83 y.o. female who had a fall and hit her left lower leg on the corner of a coffee table. She developed a hematoma on the lateral aspect of her left lower leg. She had significant pain with ambulation and lower extremity edema. She also developed erythema and hypersensitivity over skin about this area of hematoma. Conservative treatment with compressive wraps and elevation did not alleviate her symptoms. She was also placed on 10 day course of antibiotics which did not seem to help her erythema. Given the hematoma had not resolved, she had continued erythema, and there was concern for necrotic skin, I offered surgery to her in the form of I&D of leg with application of VAC.   DESCRIPTION OF PROCEDURE: The patient was seen in the Holding Room. The risks, benefits, complications, treatment options, and expected outcomes were discussed with the patient. The patient concurred with the proposed plan, giving informed consent.  The site of surgery was properly noted/marked.   The patient was taken to Operating Room, identified as DENNISE RAABE and the procedure verified. A Time Out was held and the above information confirmed. After administration of adequate anesthesia, the entire lower extremity was prescrubbed with Hibiclens and alcohol, prepped with Chloroprep, and draped in sterile fashion. Pre-operative IV antibiotics were until  intraoperative cultures were obtained. They were administered immediately after culture swab was taken.  We began by making a 4 cm longitudinal incision over the hematoma and necrotic skin. Culture swab was taken. There was no gross purulence. However, there was significant congealed hematoma. The skin was extremely friable and the entire necrotic patch was therefore debrided. The hematoma was evacuated with a combination of a curette, irrigation, and mechanical debridement with a sponge. The necrotic skin edges were also debrided sharply with a 15 blade until there were healthy, bleeding skin edges. The skin defect post-debridement measured ~7cm in length (proximal/distal) x 3.5cm (anterior/posterior). After debridement of the hematoma, I was able to directly palpate the periosteum of her fibula and fascial covering of her deep muscle. The superficial skin and dermal layers were non-existent and likely part of the hematoma process. Adequate hemostasis was achieved with bovie electrocautery. The wound was thoroughly irrigated with gentle pulse lavage. Lastly, a wound VAC was placed at 124mm Hg of low, continuous pressure. There was no leak as we had an excellent seal.     DISPOSITION: Inpatient to orthopedic floor   ATTENDING ATTESTATION:  I performed the entirety of the surgery.  Leim Fabry, MD  POSTOPERATIVE PLAN:  Weightbearing as tolerated with walker. Continue IV antibiotics until Cultures are finalized. Re-start home Eliquis on POD#2. PT/OT consult as she will likely need SNF placement. Continue VAC for at least 2-3 weeks.  Patient to return to clinic in ~10 days to do VAC change.

## 2017-08-03 NOTE — Clinical Social Work Note (Signed)
Clinical Social Work Assessment  Patient Details  Name: Rachael Horne MRN: [id number] Date of Birth: Mar 01, 1928  Date of referral:  08/03/17               Reason for consult:  Facility Placement                Permission sought to share information with:  Chartered certified accountant granted to share information::  Yes, Verbal Permission Granted  Name::      [place]::   [place]   Relationship::     Contact Information:     Housing/Transportation Living arrangements for the past 2 months:  [place] of Information:  Patient Patient Interpreter Needed:  None Criminal Activity/Legal Involvement Pertinent to Current Situation/Hospitalization:  No - Comment as needed Significant Relationships:  Adult Children Lives with:  Self Do you feel safe going back to the place where you live?  Yes Need for family participation in patient care:  Yes (Comment)  Care giving concerns:  Patient lives alone in an apartment in [place].    Social Worker assessment / plan:  Holiday representative (CSW) received SNF consult. PT is recommending SNF. CSW met with patient alone at bedside to discuss D/C plan. Patient was alert and oriented X4 and was laying in the bed. CSW introduced self and explained role of CSW department. Patient reported that she lives alone and has 1 son Rachael Horne [phone] that lives in [place]. CSW discussed SNF process. Patient is agreeable to SNF search in [facility]. FL2 complete and faxed out.   CSW presented bed offers. Patient prefers [facility], however they have not made an offer yet. Patient is agreeable to going to [facility] if [facility] can't make an offer. CSW will continue to follow and assist as needed.   Employment status:  Disabled (Comment on whether or not currently receiving Disability), Retired Nurse, adult PT Recommendations:  [place]  / Referral to community resources:  [place]  Patient/Family's Response to care:  Patient is agreeable to [facility] in [place].   Patient/Family's Understanding of and Emotional Response to Diagnosis, Current Treatment, and Prognosis:  Patient was very pleasant and thanked CSW for assistance.   Emotional Assessment Appearance:  Appears stated age Attitude/Demeanor/Rapport:    Affect (typically observed):  Accepting, Adaptable, Pleasant Orientation:  Oriented to Self, Oriented to Place, Oriented to  Time, Oriented to Situation Alcohol / Substance use:  Not Applicable Psych involvement (Current and /or in the community):  No (Comment)  Discharge Needs  Concerns to be addressed:  Discharge Planning Concerns Readmission within the last 30 days:  No Current discharge risk:  Dependent with Mobility Barriers to Discharge:  Continued Medical Work up   [employer], Veronia Beets, LCSW 08/03/2017, 5:06 PM

## 2017-08-03 NOTE — Progress Notes (Signed)
Clinical Education officer, museum (CSW) received SNF consult. PT is pending. CSW will continue to follow and assist as needed.   [employer], LCSW [phone]

## 2017-08-03 NOTE — Progress Notes (Signed)
Physical Therapy Treatment Patient Details Name: Rachael Horne MRN: [id number] DOB: 1928/10/31 Today's Date: 08/03/2017    PT Comments    [name], PT, DPT, NCS 08/03/17, 9:48 PM [phone]   Per chart review, patient diagnosis consistent with 2x/week treatment frequency.  Chart updated to reflect.

## 2017-08-03 NOTE — Progress Notes (Signed)
Spoke with wound consult nurse, no immediate/urgent need at this time, will see pt on Monday. Plan is for M/W/F schedule.

## 2017-08-03 NOTE — Progress Notes (Signed)
Subjective: 1 Day Post-Op Procedure(s) (LRB): I & D LEFT LEG HEMATOMA (Left) APPLICATION OF WOUND VAC (Left) Patient reports pain as mild this AM, has not taken anything for pain. Patient is well, and has had no acute complaints or problems Plan is to go Skilled nursing facility after hospital stay.  PT and Care management to assist with discharge planning. Negative for chest pain and shortness of breath Fever: no Gastrointestinal:negative for nausea and vomiting  Objective: Vital signs in last 24 hours: Temp:  [97.5 F (36.4 C)-98.4 F (36.9 C)] 98.4 F (36.9 C) (09/01 0440) Pulse Rate:  [76-91] 80 (09/01 0440) Resp:  [10-21] 18 (09/01 0440) BP: (138-173)/(56-74) 138/62 (09/01 0440) SpO2:  [93 %-100 %] 94 % (09/01 0440) Weight:  [62.1 kg (136 lb 14.4 oz)] 62.1 kg (136 lb 14.4 oz) (09/01 0200)  Intake/Output from previous day:  Intake/Output Summary (Last 24 hours) at 08/03/17 0712 Last data filed at 08/03/17 1829  Gross per 24 hour  Intake             1960 ml  Output              775 ml  Net             1185 ml    Intake/Output this shift: No intake/output data recorded.  Labs:  Recent Labs  08/02/17 0715 08/02/17 1251 08/03/17 0457  HGB 12.9 11.9* 11.8*    Recent Labs  08/02/17 1251 08/03/17 0457  WBC  --  5.9  RBC  --  3.68*  HCT 35.0* 34.8*  PLT  --  343    Recent Labs  08/02/17 1251 08/03/17 0457  NA 135 134*  K 3.4* 3.4*  CL  --  98*  CO2  --  25  BUN  --  26*  CREATININE  --  0.90  GLUCOSE 119* 276*  CALCIUM  --  8.5*   No results for input(s): LABPT, INR in the last 72 hours.   EXAM General - Patient is Alert, Appropriate and Oriented Extremity - ABD soft Sensation intact distally Dorsiflexion/Plantar flexion intact Dressing/Incision - Woundvac intact with excellent seal. Motor Function - intact, moving foot and toes well on exam, able to dorsiflex and plantarflex the ankle without much pain.  Past Medical History:  Diagnosis  Date  . Anxiety   . Arthritis   . Atrial fibrillation ([place])   . Breast cancer [facility]) 2013   right breast, radiation  . Breast cancer ([place]) 2007   left breast, radiation  . Cancer ([place])    BL breast  . CHF (congestive heart failure) ([place])   . Coronary artery disease   . Diabetes mellitus without complication ([place])   . DVT (deep venous thrombosis) ([place]) 2003  . Dyspnea    with exertion  . Dysrhythmia   . GERD (gastroesophageal reflux disease)   . Hyperlipemia   . Hypertension   . Stroke [facility]) 2007   TIA  . Traumatic hematoma of left lower leg 07/2017    Assessment/Plan: 1 Day Post-Op Procedure(s) (LRB): I & D LEFT LEG HEMATOMA (Left) APPLICATION OF WOUND VAC (Left) Active Problems:   Hematoma of leg  Estimated body mass index is 27.65 kg/m as calculated from the following:   Height as of this encounter: 4\' 11"  (1.499 m).   Weight as of this encounter: 62.1 kg (136 lb 14.4 oz). Advance diet Up with therapy D/C IV fluids when tolerating po intake.  Labs reviewed, Hg 11.8 K+  3.4, already on potassium supplementation. Up with therapy today, plan will be for discharge to SNF as the patient lives at home by herself and has woundvac applied. Culture in progress, results not returned at this time, patient currently on Clindamycin until cultures return. Possible discharge to SNF over the weekend pending progress with PT.  DVT Prophylaxis - Foot Pumps and Eliquis Weight-Bearing as tolerated to left leg  J. Cameron Proud, PA-C [facility] Orthopaedic Surgery 08/03/2017, 7:12 AM

## 2017-08-03 NOTE — Plan of Care (Signed)
Problem: Acute Rehab PT Goals(only PT should resolve) Goal: Pt Will Go Supine/Side To Sit Outcome: Progressing Patient will be independent for bed mobility to be able to self adjust and decrease risk of developing pressure ulcers.  Goal: Patient Will Transfer Sit To/From Stand Outcome: Progressing Patient will be modI for sit to stand with use of rollator to prepare to walk to the restroom without others assistance. Goal: Pt Will Ambulate Outcome: Progressing Patient will be able to amb 148ft to be able to walk to the bathroom without another person's assist.

## 2017-08-03 NOTE — Clinical Social Work Placement (Signed)
   CLINICAL SOCIAL WORK PLACEMENT  NOTE  Date:  08/03/2017  Patient Details  Name: Rachael Horne MRN: [id number] Date of Birth: 02/18/1928  Clinical Social Work is seeking post-discharge placement for this patient at the [place] level of care (*CSW will initial, date and re-position this form in  chart as items are completed):  Yes   Patient/family provided with [place] Work Department's list of facilities offering this level of care within the geographic area requested by the patient (or if unable, by the patient's family).  Yes   Patient/family informed of their freedom to choose among providers that offer the needed level of care, that participate in Medicare, Medicaid or managed care program needed by the patient, have an available bed and are willing to accept the patient.  Yes   Patient/family informed of [facility]'s ownership interest in [facility] and [facility], as well as of the fact that they are under no obligation to receive care at these facilities.  PASRR submitted to EDS on       PASRR number received on       Existing PASRR number confirmed on 08/03/17     FL2 transmitted to all facilities in geographic area requested by pt/family on 08/03/17     FL2 transmitted to all facilities within larger geographic area on       Patient informed that his/her managed care company has contracts with or will negotiate with certain facilities, including the following:        Yes   Patient/family informed of bed offers received.  Patient chooses bed at       Physician recommends and patient chooses bed at      Patient to be transferred to   on  .  Patient to be transferred to facility by       Patient family notified on   of transfer.  Name of family member notified:        PHYSICIAN       Additional Comment:    _______________________________________________ Delana Manganello, Veronia Beets, LCSW 08/03/2017, 5:06 PM

## 2017-08-03 NOTE — Evaluation (Signed)
Physical Therapy Evaluation Patient Details Name: Rachael Horne MRN: [id number] DOB: [birth year] Today's Date: 08/03/2017   History of Present Illness  Pt is an 83 yo female presenting with a hematoma of the leg s/p a fall she experienced 3 weeks prior. Patient reports increased pain on the leg after the fall which did not get better over time. She was admitted to the hospital and a wound vac was inserted over the leg. Patient has a PMH of A-fib, balance difficulties, and repeated falls.   Clinical Impression  Patient demonstrates decreased motor control and LE strength with movement most notably when performing transfers (bed mobility, and sit <-> stands). Patient also demonstrates decreased balance with inability to perform activities without constant UE support indicating increased fall risk. Patient also fatigues easily with activity and will benefit from further skilled therapy. Recommend SNF.     Follow Up Recommendations SNF    Equipment Recommendations       Recommendations for Other Services       Precautions / Restrictions Precautions Precautions: Fall      Mobility  Bed Mobility Overal bed mobility: Needs Assistance Bed Mobility: Supine to Sit     Supine to sit: Mod assist;Min assist     General bed mobility comments: Patient requires increased assistance for moving her LEs  Transfers Overall transfer level: Needs assistance Equipment used: 4-wheeled walker Transfers: Sit to/from Stand Sit to Stand: Min assist         General transfer comment: Patient demonstrates slow speed with performance, requires cues on hand placement.   Ambulation/Gait Ambulation/Gait assistance: Modified independent (Device/Increase time) Ambulation Distance (Feet): 50 Feet Assistive device: 4-wheeled walker Gait Pattern/deviations: Step-through pattern;Decreased step length - right;Decreased step length - left;Decreased weight shift to left;Ataxic   Gait velocity  interpretation: Below normal speed for age/gender General Gait Details: Patient demonstrates slow gait pattern and fatigues easily with ambulation   Stairs            Wheelchair Mobility    Modified Rankin (Stroke Patients Only)       Balance Overall balance assessment: Needs assistance   Sitting balance-Leahy Scale: Fair Sitting balance - Comments: Once positioned correctly, patient can maintain seated balance without support     Standing balance-Leahy Scale: Poor Standing balance comment: Patient requires heavy UE support to balance.                              Pertinent Vitals/Pain Pain Assessment: 0-10 Pain Score: 6  Pain Location: L Lower leg Pain Descriptors / Indicators: Aching;Sore Pain Intervention(s): Monitored during session;Repositioned;Limited activity within patient's tolerance    Home Living Family/patient expects to be discharged to:: Private residence Living Arrangements: Alone Available Help at Discharge: Family;Friend(s) Type of Home: Apartment Home Access: Level entry     Home Layout: One level Home Equipment: Walker - 4 wheels      Prior Function Level of Independence: Independent         Comments: Patient reports repeated fall over the past year and decreased balance      Hand Dominance        Extremity/Trunk Assessment   Upper Extremity Assessment Upper Extremity Assessment: Overall WFL for tasks assessed    Lower Extremity Assessment Lower Extremity Assessment: Generalized weakness       Communication   Communication: No difficulties  Cognition Arousal/Alertness: Awake/alert Behavior During Therapy: WFL for tasks assessed/performed Overall Cognitive Status: Within Functional Limits  for tasks assessed                                        General Comments      Exercises Other Exercises Other Exercises: Performed ambulation of 50 feet with rollator walker. standing time for  endurance -- 3 min, performed sit to stand x 2    Assessment/Plan    PT Assessment Patient needs continued PT services  PT Problem List Decreased strength;Decreased range of motion;Decreased coordination;Decreased activity tolerance;Decreased cognition;Decreased balance;Pain       PT Treatment Interventions Gait training;Therapeutic activities;Balance training;Therapeutic exercise;Neuromuscular re-education;Patient/family education    PT Goals (Current goals can be found in the Care Plan section)  Acute Rehab PT Goals Patient Stated Goal: Patient would like to walk better PT Goal Formulation: With patient Time For Goal Achievement: 08/17/17 Potential to Achieve Goals: Fair    Frequency 7X/week   Barriers to discharge Inaccessible home environment Decreased strength and balance     Co-evaluation               AM-PAC PT "6 Clicks" Daily Activity  Outcome Measure Difficulty turning over in bed (including adjusting bedclothes, sheets and blankets)?: A Lot Difficulty moving from lying on back to sitting on the side of the bed? : A Lot Difficulty sitting down on and standing up from a chair with arms (e.g., wheelchair, bedside commode, etc,.)?: A Lot Help needed moving to and from a bed to chair (including a wheelchair)?: A Lot Help needed walking in hospital room?: A Lot Help needed climbing 3-5 steps with a railing? : Total 6 Click Score: 11    End of Session Equipment Utilized During Treatment: Gait belt Activity Tolerance: Patient tolerated treatment well;Patient limited by fatigue Patient left: in bed;with bed alarm set;with call bell/phone within reach;with SCD's reapplied Nurse Communication: Mobility status PT Visit Diagnosis: Unsteadiness on feet (R26.81);History of falling (Z91.81);Ataxic gait (R26.0);Muscle weakness (generalized) (M62.81);Difficulty in walking, not elsewhere classified (R26.2)    Time: 3500-9381 PT Time Calculation (min) (ACUTE ONLY): 38  min   Charges:   PT Evaluation $PT Eval Moderate Complexity: 1 Mod PT Treatments $Gait Training: 23-37 mins   PT G Codes:   PT G-Codes **NOT FOR INPATIENT CLASS** Functional Assessment Tool Used: AM-PAC 6 Clicks Basic Mobility Functional Limitation: Mobility: Walking and moving around Mobility: Walking and Moving Around Current Status (W2993): At least 60 percent but less than 80 percent impaired, limited or restricted Mobility: Walking and Moving Around Goal Status [id number]): At least 40 percent but less than 60 percent impaired, limited or restricted    Blythe Stanford, PT DPT 08/03/17, 4:25 PM [phone]

## 2017-08-04 LAB — ALBUMIN: Albumin: 3.1 g/dL — ABNORMAL LOW (ref 3.5–5.0)

## 2017-08-04 LAB — GLUCOSE, CAPILLARY
GLUCOSE-CAPILLARY: 133 mg/dL — AB (ref 65–99)
GLUCOSE-CAPILLARY: 98 mg/dL (ref 65–99)
GLUCOSE-CAPILLARY: 144 mg/dL — AB (ref 65–99)
Glucose-Capillary: 104 mg/dL — ABNORMAL HIGH (ref 65–99)
Glucose-Capillary: 113 mg/dL — ABNORMAL HIGH (ref 65–99)

## 2017-08-04 LAB — CBC
HCT: 31.9 % — ABNORMAL LOW (ref 35.0–47.0)
HEMOGLOBIN: 10.8 g/dL — AB (ref 12.0–16.0)
MCH: 31.9 pg (ref 26.0–34.0)
MCHC: 34 g/dL (ref 32.0–36.0)
MCV: 94.1 fL (ref 80.0–100.0)
Platelets: 322 10*3/uL (ref 150–440)
RBC: 3.4 MIL/uL — ABNORMAL LOW (ref 3.80–5.20)
RDW: 14.8 % — ABNORMAL HIGH (ref 11.5–14.5)
WBC: 13.1 10*3/uL — ABNORMAL HIGH (ref 3.6–11.0)

## 2017-08-04 LAB — BASIC METABOLIC PANEL
ANION GAP: 7 (ref 5–15)
BUN: 30 mg/dL — ABNORMAL HIGH (ref 6–20)
CALCIUM: 8.3 mg/dL — AB (ref 8.9–10.3)
CO2: 27 mmol/L (ref 22–32)
CREATININE: 0.89 mg/dL (ref 0.44–1.00)
Chloride: 99 mmol/L — ABNORMAL LOW (ref 101–111)
GFR calc Af Amer: >60 mL/min (ref >60)
GFR calc non Af Amer: 57 mL/min — ABNORMAL LOW (ref >60)
GLUCOSE: 119 mg/dL — AB (ref 65–99)
Potassium: 3.9 mmol/L (ref 3.5–5.1)
Sodium: 133 mmol/L — ABNORMAL LOW (ref 135–145)

## 2017-08-04 LAB — PREALBUMIN: PREALBUMIN: 20.1 mg/dL (ref 18–38)

## 2017-08-04 MED ORDER — TRAZODONE HCL 50 MG PO TABS
50.0000 mg | ORAL_TABLET | Freq: Once | ORAL | Status: AC
  Administered 2017-08-04: 50 mg via ORAL
  Filled 2017-08-04: qty 1

## 2017-08-04 NOTE — Evaluation (Signed)
Occupational Therapy Evaluation Patient Details Name: Rachael Horne MRN: [id number] DOB: 13-Sep-1928 Today's Date: 08/04/2017    History of Present Illness Pt is an 83 yo female presenting with a hematoma of the leg s/p a fall she experienced 3 weeks prior. Patient reports increased pain on the leg after the fall which did not get better over time. She was admitted to the hospital and a wound vac was inserted over the leg. Patient has a PMH of A-fib, balance difficulties, and repeated falls.    Clinical Impression   Pt seen for OT evaluation this date. Pt was modified independent at baseline, requiring rollator or SPC for functional mobility, not driving, and generally independent with self care tasks, light meal prep, and light household tasks. Pt no longer driving due to severe dry eyes/eye sensitivity to light, so relies on family/friends to take her places. Pt endorses having a life alert pendant at home. Pt more recently limited in functional independence due to LLE pain/difficulty with weightbearing. Pt endorses >2 falls in past 12 months due to losing her balance, and also reports getting slightly lightheaded momentarily when getting up out of bed in the mornings, so she sits on the side of the bed until she feels confident in standing and moving away from the bed. Pt presents with impairments in strength, balance, activity tolerance, and increase risk of falls. Pt required min assist from therapist to perform transfers to [facility] for toileting tasks this date. Pt will benefit from skilled OT services to address noted impairments and functional deficits in mobility and self care tasks in order to maximize return to PLOF and minimize risk of future falls/injury/rehospitalization/increased caregiver burden.    Follow Up Recommendations  SNF    Equipment Recommendations  Other (comment) (grab bar in shower)    Recommendations for Other Services       Precautions / Restrictions  Precautions Precautions: Fall Restrictions Weight Bearing Restrictions: Yes LLE Weight Bearing: Weight bearing as tolerated      Mobility Bed Mobility Overal bed mobility: Needs Assistance Bed Mobility: Supine to Sit;Sit to Supine     Supine to sit: HOB elevated     General bed mobility comments: increased time/effort but ultimately no physical assist required  Transfers Overall transfer level: Needs assistance Equipment used: 1 person hand held assist Transfers: Sit to/from [facility] Sit to Stand: Min assist Stand pivot transfers: Min assist       General transfer comment: cues for safe hand placement, cues for posture to improve center of gravity over base of support    Balance Overall balance assessment: Needs assistance;History of Falls Sitting-balance support: Feet supported;Single extremity supported Sitting balance-Leahy Scale: Good     Standing balance support: Bilateral upper extremity supported;During functional activity Standing balance-Leahy Scale: Poor                             ADL either performed or assessed with clinical judgement   ADL Overall ADL's : Needs assistance/impaired     Grooming: Sitting;Set up   Upper Body Bathing: Sitting;Set up;Supervision/ safety   Lower Body Bathing: Sitting/lateral leans;Sit to/from stand;Minimal assistance   Upper Body Dressing : Sitting;Set up;Supervision/safety   Lower Body Dressing: Sitting/lateral leans;Sit to/from stand;Minimal assistance   Toilet Transfer: Stand-pivot;Cueing for safety;BSC;Minimal assistance Toilet Transfer Details (indicate cue type and reason): min assist and x1 HHA with verbal cues for safety and hand placement to perform SPT EOB<>BSC Toileting- Clothing  Manipulation and Hygiene: Set up;Sitting/lateral lean;Supervision/safety         General ADL Comments: pt generally min assist level for LB ADL, would benefit from additional education/training in  AE for LB ADL to minimize risk of falls and improve functional independence.     Vision Baseline Vision/History: Wears glasses (also has dark sunglasses to help her very dry eyes per pt report) Wears Glasses: At all times Patient Visual Report: No change from baseline Vision Assessment?: No apparent visual deficits     Perception     Praxis      Pertinent Vitals/Pain Pain Assessment: No/denies pain Pain Location: L Lower leg Pain Intervention(s): Limited activity within patient's tolerance;Monitored during session;Repositioned     Hand Dominance Right   Extremity/Trunk Assessment Upper Extremity Assessment Upper Extremity Assessment: Overall WFL for tasks assessed   Lower Extremity Assessment Lower Extremity Assessment: Overall WFL for tasks assessed   Cervical / Trunk Assessment Cervical / Trunk Assessment: Kyphotic   Communication Communication Communication: No difficulties   Cognition Arousal/Alertness: Awake/alert Behavior During Therapy: WFL for tasks assessed/performed Overall Cognitive Status: Within Functional Limits for tasks assessed                                     General Comments       Exercises Other Exercises Other Exercises: pt educated in home/routines modifications to minimize bending over and maximize safety/falls prevention. Pt verbalized understanding.   Shoulder Instructions      Home Living Family/patient expects to be discharged to:: Private residence Living Arrangements: Alone Available Help at Discharge: Family;Friend(s);Available PRN/intermittently Type of Home: Apartment Home Access: Level entry     Home Layout: One level     Bathroom Shower/Tub: Walk-in shower (6inch lip)   Bathroom Toilet: Standard     Home Equipment: Environmental consultant - 4 wheels;Shower seat - built in;Cane - single point;Bedside commode;Grab bars - tub/shower   Additional Comments: vertical grab bar outside of walk-in shower that pt can also  reach from toilet.      Prior Functioning/Environment Level of Independence: Independent;Independent with assistive device(s)        Comments: Pt mod indep with functional mobility, using a rollator for ambulation, and sometimes a SPC for car transfers and shower transfers for additional stability. Pt indep with ADL tasks and light meal prep/housekeeping tasks. However, recently relying on sponge baths due to LLE pain preventing her from feeling safe enough to transfer in/out of shower. Relies on family/friends to drive her. Patient reports repeated falls over the past year and decreased balance with increased fear of falling.        OT Problem List: Decreased strength;Pain;Decreased safety awareness;Decreased activity tolerance;Impaired balance (sitting and/or standing);Decreased knowledge of use of DME or AE      OT Treatment/Interventions: Self-care/ADL training;Therapeutic exercise;Therapeutic activities;Energy conservation;DME and/or AE instruction;Patient/family education    OT Goals(Current goals can be found in the care plan section) Acute Rehab OT Goals Patient Stated Goal: get stronger OT Goal Formulation: With patient Time For Goal Achievement: 08/18/17 Potential to Achieve Goals: Good  OT Frequency: Min 1X/week   Barriers to D/C: Decreased caregiver support          Co-evaluation              AM-PAC PT "6 Clicks" Daily Activity     Outcome Measure Help from another person eating meals?: A Little Help from another person taking care  of personal grooming?: A Little Help from another person toileting, which includes using toliet, bedpan, or urinal?: A Little Help from another person bathing (including washing, rinsing, drying)?: A Little Help from another person to put on and taking off regular upper body clothing?: A Little Help from another person to put on and taking off regular lower body clothing?: A Little 6 Click Score: 18   End of Session Nurse  Communication: Other (comment) (pt requesting Tylenol PM for this evening (something she takes at baseline to help with sleep))  Activity Tolerance: Patient tolerated treatment well Patient left: in bed;with call bell/phone within reach;with bed alarm set;with SCD's reapplied  OT Visit Diagnosis: Other abnormalities of gait and mobility (R26.89);Repeated falls (R29.6);Pain Pain - Right/Left: Left Pain - part of body: Leg                Time: 0867-6195 OT Time Calculation (min): 40 min Charges:  OT General Charges $OT Visit: 1 Visit OT Evaluation $OT Eval Low Complexity: 1 Low OT Treatments $Self Care/Home Management : 8-22 mins G-Codes: OT G-codes **NOT FOR INPATIENT CLASS** Functional Assessment Tool Used: AM-PAC 6 Clicks Daily Activity;Clinical judgement Functional Limitation: Self care Self Care Current Status (K9326): At least 40 percent but less than 60 percent impaired, limited or restricted Self Care Goal Status (Z1245): At least 20 percent but less than 40 percent impaired, limited or restricted   Jeni Salles, MPH, MS, OTR/L ascom [phone] 08/04/17, 10:59 AM

## 2017-08-04 NOTE — Progress Notes (Signed)
Subjective: 2 Days Post-Op Procedure(s) (LRB): I & D LEFT LEG HEMATOMA (Left) APPLICATION OF WOUND VAC (Left) Patient reports pain as mild this AM, has not taken anything for pain. Patient is well, and has had no acute complaints or problems Plan is to go Skilled nursing facility after hospital stay.   PT and Care management to assist with discharge planning. Negative for chest pain and shortness of breath Fever: no Gastrointestinal:negative for nausea and vomiting  Objective: Vital signs in last 24 hours: Temp:  [97.3 F (36.3 C)-97.5 F (36.4 C)] 97.3 F (36.3 C) (09/01 2101) Pulse Rate:  [48-100] 100 (09/01 2102) Resp:  [16-18] 18 (09/01 2101) BP: (118-153)/(47-64) 118/47 (09/01 2101) SpO2:  [93 %-98 %] 96 % (09/01 2102)  Intake/Output from previous day:  Intake/Output Summary (Last 24 hours) at 08/04/17 0836 Last data filed at 08/03/17 1851  Gross per 24 hour  Intake              480 ml  Output              550 ml  Net              -70 ml    Intake/Output this shift: No intake/output data recorded.  Labs:  Recent Labs  08/02/17 0715 08/02/17 1251 08/03/17 0457 08/04/17 0448  HGB 12.9 11.9* 11.8* 10.8*    Recent Labs  08/03/17 0457 08/04/17 0448  WBC 5.9 13.1*  RBC 3.68* 3.40*  HCT 34.8* 31.9*  PLT 343 322    Recent Labs  08/03/17 0457 08/04/17 0448  NA 134* 133*  K 3.4* 3.9  CL 98* 99*  CO2 25 27  BUN 26* 30*  CREATININE 0.90 0.89  GLUCOSE 276* 119*  CALCIUM 8.5* 8.3*   No results for input(s): LABPT, INR in the last 72 hours.   EXAM General - Patient is Alert, Appropriate and Oriented Extremity - ABD soft Sensation intact distally Dorsiflexion/Plantar flexion intact Dressing/Incision - Woundvac intact with excellent seal. Motor Function - intact, moving foot and toes well on exam, able to dorsiflex and plantarflex the ankle without much pain.  Abdomen soft with normal BS, no pain with palpation.  Pt reports that she is passing  gas.  Past Medical History:  Diagnosis Date  . Anxiety   . Arthritis   . Atrial fibrillation ([place])   . Breast cancer [facility]) 2013   right breast, radiation  . Breast cancer ([place]) 2007   left breast, radiation  . Cancer ([place])    BL breast  . CHF (congestive heart failure) ([place])   . Coronary artery disease   . Diabetes mellitus without complication ([place])   . DVT (deep venous thrombosis) ([place]) 2003  . Dyspnea    with exertion  . Dysrhythmia   . GERD (gastroesophageal reflux disease)   . Hyperlipemia   . Hypertension   . Stroke [facility]) 2007   TIA  . Traumatic hematoma of left lower leg 07/2017    Assessment/Plan: 2 Days Post-Op Procedure(s) (LRB): I & D LEFT LEG HEMATOMA (Left) APPLICATION OF WOUND VAC (Left) Active Problems:   Hematoma of leg  Estimated body mass index is 27.65 kg/m as calculated from the following:   Height as of this encounter: 4\' 11"  (1.499 m).   Weight as of this encounter: 62.1 kg (136 lb 14.4 oz). Advance diet Up with therapy D/C IV fluids when tolerating po intake.  Labs reviewed, Hg 10.8 Hypokalemia resolved. Up with therapy today, plan will be  for discharge to SNF as the patient lives at home by herself and has woundvac applied. Culture in progress, results not returned at this time, patient currently on Clindamycin until cultures return. Will plan on changing Woundvac tomorrow morning. Albumin and prealbumin ordered for the patient.  CBC and BMP ordered for tomorrow morning.  DVT Prophylaxis - Foot Pumps and Eliquis starting today. Weight-Bearing as tolerated to left leg  J. Cameron Proud, PA-C [facility] Orthopaedic Surgery 08/04/2017, 8:36 AM

## 2017-08-04 NOTE — Progress Notes (Signed)
Pt requests order for calcium chewables and tylenol PM for bedtime. Will notify MD at rounds.

## 2017-08-04 NOTE — Clinical Social Work Note (Signed)
CSW presented bed offers to patient and she chose to go to [facility] [place].  [facility] was contacted and can accept patient once she is medically ready for discharge and orders have been received.  Jones Broom. [place], MSW, [place]  08/04/2017 12:08 PM

## 2017-08-05 LAB — BASIC METABOLIC PANEL
Anion gap: 4 — ABNORMAL LOW (ref 5–15)
BUN: 22 mg/dL — AB (ref 6–20)
CALCIUM: 8 mg/dL — AB (ref 8.9–10.3)
CO2: 28 mmol/L (ref 22–32)
CREATININE: 0.73 mg/dL (ref 0.44–1.00)
Chloride: 103 mmol/L (ref 101–111)
GFR calc Af Amer: >60 mL/min (ref >60)
GLUCOSE: 87 mg/dL (ref 65–99)
Potassium: 3.7 mmol/L (ref 3.5–5.1)
Sodium: 135 mmol/L (ref 135–145)

## 2017-08-05 LAB — CBC
HCT: 30.8 % — ABNORMAL LOW (ref 35.0–47.0)
Hemoglobin: 10.6 g/dL — ABNORMAL LOW (ref 12.0–16.0)
MCH: 32 pg (ref 26.0–34.0)
MCHC: 34.3 g/dL (ref 32.0–36.0)
MCV: 93.4 fL (ref 80.0–100.0)
PLATELETS: 300 10*3/uL (ref 150–440)
RBC: 3.3 MIL/uL — AB (ref 3.80–5.20)
RDW: 15.3 % — ABNORMAL HIGH (ref 11.5–14.5)
WBC: 8.4 10*3/uL (ref 3.6–11.0)

## 2017-08-05 MED ORDER — CLINDAMYCIN HCL 300 MG PO CAPS: 600.0000 mg | ORAL_CAPSULE | Freq: Three times a day (TID) | ORAL | 0 refills | Status: DC

## 2017-08-05 MED ORDER — OXYCODONE HCL 5 MG PO TABS: 5.0000 mg | ORAL_TABLET | Freq: Four times a day (QID) | ORAL | 0 refills | Status: DC | PRN

## 2017-08-05 NOTE — Progress Notes (Signed)
Rept called to [employer] at [facility]. Plan is to d/c to [facility] today. Packet ready for discharge. Dressing to LLE wet to dry per [place] nurse per MD order and wound vac d/ced prior to discharge. Non emergency transport called.

## 2017-08-05 NOTE — Progress Notes (Signed)
Pt discharged to [facility] via stretcher via non emergency traffic accompanied by caregiver per MD order without incident. No change in patient from AM assessment. Pt pain controlled on discharge.

## 2017-08-05 NOTE — NC FL2 (Signed)
[place] LEVEL OF CARE SCREENING TOOL     IDENTIFICATION  Patient Name: Rachael Horne Birthdate: 1928-01-06 Sex: female Admission Date (Current Location): 08/02/2017  [place] and [place] Number:  Engineering geologist and Address:  [facility], [address] [place], [place] [address]      Provider Number: [id number]  Attending Physician Name and Address:  Leim Fabry, MD  Relative Name and Phone Number:       Current Level of Care: Hospital Recommended Level of Care: [place] Prior Approval Number:    Date Approved/Denied:   PASRR Number:  ([id number] )  Discharge Plan: SNF    Current Diagnoses: Patient Active Problem List   Diagnosis Date Noted  . Hematoma of leg 08/02/2017  . Sepsis ([place]) 11/03/2015  . Acute bronchitis 11/03/2015  . Fever 11/03/2015  . Atrial fibrillation with RVR ([place]) 11/03/2015  . A-fib ([place]) 08/22/2015  . Primary cancer of upper outer quadrant of right female breast ([place]) 08/22/2015  . Diabetes mellitus, type 2 ([place]) 08/22/2015  . Allergy to environmental factors 08/22/2015  . History of knee problem 08/22/2015  . HLD (hyperlipidemia) 08/22/2015  . BP (high blood pressure) 08/22/2015  . MI (mitral incompetence) 08/22/2015  . Arthritis of hand, degenerative 08/22/2015  . TI (tricuspid incompetence) 08/22/2015  . Bradycardia 08/23/2014  . Arteriosclerosis of coronary artery 08/23/2014  . History of asthma 08/23/2014  . H/O deep venous thrombosis 08/23/2014  . Arthritis, degenerative 08/23/2014  . Adiposity 08/23/2014  . Temporary cerebral vascular dysfunction 08/23/2014  . Awareness of heartbeats 08/23/2014  . Absolute anemia 05/06/2014  . Cerebrovascular accident, old 07/03/1997    Orientation RESPIRATION BLADDER Height & Weight     Self, Time, Situation, Place  Normal Continent Weight: 136 lb 14.4 oz (62.1 kg) Height:  4\' 11"  (149.9 cm)  BEHAVIORAL SYMPTOMS/MOOD  NEUROLOGICAL BOWEL NUTRITION STATUS   (none)  (none) Continent Diet (Regular Diet )  AMBULATORY STATUS COMMUNICATION OF NEEDS Skin   Extensive Assist Verbally Surgical wounds                       Personal Care Assistance Level of Assistance  Bathing, Feeding, Dressing Bathing Assistance: Limited assistance Feeding assistance: Independent Dressing Assistance: Limited assistance     Functional Limitations Info  Sight, Hearing, Speech Sight Info: Adequate Hearing Info: Adequate Speech Info: Adequate    SPECIAL CARE FACTORS FREQUENCY  PT (By licensed PT), OT (By licensed OT)     PT Frequency:  (5) OT Frequency:  (5)            Contractures      Additional Factors Info  Code Status, Allergies Code Status Info:  (Full Code. ) Allergies Info:  (Amoxicillin)           Current Medications (08/05/2017):  This is the current hospital active medication list Current Facility-Administered Medications  Medication Dose Route Frequency Provider Last Rate Last Dose  . 0.9 % NaCl with KCl 40 mEq / L  infusion   Intravenous Continuous Leim Fabry, MD 50 mL/hr at 08/04/17 1757 50 mL/hr at 08/04/17 1757  . acetaminophen (TYLENOL) tablet 1,000 mg  1,000 mg Oral TID Leim Fabry, MD   1,000 mg at 08/04/17 2206  . albuterol (PROVENTIL) (2.5 MG/3ML) 0.083% nebulizer solution 3 mL  3 mL Inhalation Q6H PRN Leim Fabry, MD      . ALPRAZolam Duanne Moron) tablet 0.125 mg  0.125 mg Oral QHS PRN  Leim Fabry, MD   0.125 mg at 08/03/17 2147  . anastrozole (ARIMIDEX) tablet 1 mg  1 mg Oral Daily Leim Fabry, MD   1 mg at 08/04/17 1059  . apixaban (ELIQUIS) tablet 5 mg  5 mg Oral BID Leim Fabry, MD   5 mg at 08/04/17 2206  . clindamycin (CLEOCIN) IVPB 600 mg  600 mg Intravenous Brayton Mars, MD 100 mL/hr at 08/05/17 0608 600 mg at 08/05/17 0608  . diltiazem (CARDIZEM CD) 24 hr capsule 240 mg  240 mg Oral Daily Leim Fabry, MD   240 mg at 08/04/17 1059  . docusate sodium (COLACE) capsule 100  mg  100 mg Oral BID Leim Fabry, MD   100 mg at 08/04/17 1059  . fluticasone (FLONASE) 50 MCG/ACT nasal spray 2 spray  2 spray Each Nare PRN Leim Fabry, MD      . furosemide (LASIX) tablet 40 mg  40 mg Oral BID Leim Fabry, MD   40 mg at 08/04/17 1714  . glimepiride (AMARYL) tablet 4 mg  4 mg Oral Daily Leim Fabry, MD   4 mg at 08/04/17 1059  . HYDROmorphone (DILAUDID) injection 0.2 mg  0.2 mg Intravenous Q4H PRN Leim Fabry, MD      . insulin aspart (novoLOG) injection 0-5 Units  0-5 Units Subcutaneous QHS Leim Fabry, MD      . loratadine (CLARITIN) tablet 10 mg  10 mg Oral Daily PRN Leim Fabry, MD      . losartan (COZAAR) tablet 25 mg  25 mg Oral Daily Leim Fabry, MD   25 mg at 08/04/17 1059  . magnesium oxide (MAG-OX) tablet 400 mg  400 mg Oral Q1200 Leim Fabry, MD   400 mg at 08/04/17 1417  . metolazone (ZAROXOLYN) tablet 2.5 mg  2.5 mg Oral Once per day on Mon Wed Fri Patel, Tarry Kos, MD      . mometasone-formoterol [facility]) 200-5 MCG/ACT inhaler 2 puff  2 puff Inhalation BID Leim Fabry, MD   2 puff at 08/04/17 2206  . montelukast (SINGULAIR) tablet 10 mg  10 mg Oral QHS Leim Fabry, MD   10 mg at 08/04/17 2205  . multivitamin with minerals tablet 1 tablet  1 tablet Oral Q1200 Leim Fabry, MD   1 tablet at 08/04/17 1417  . ondansetron (ZOFRAN) tablet 4 mg  4 mg Oral Q6H PRN Leim Fabry, MD       Or  . ondansetron [facility]) injection 4 mg  4 mg Intravenous Q6H PRN Leim Fabry, MD      . oxyCODONE (Oxy IR/ROXICODONE) immediate release tablet 5 mg  5 mg Oral Q6H PRN Leim Fabry, MD   5 mg at 08/05/17 0605  . pantoprazole (PROTONIX) EC tablet 40 mg  40 mg Oral PRN Leim Fabry, MD      . potassium chloride (K-DUR,KLOR-CON) CR tablet 10 mEq  10 mEq Oral Q1200 Leim Fabry, MD   10 mEq at 08/04/17 1417  . pravastatin (PRAVACHOL) tablet 40 mg  40 mg Oral q1800 Leim Fabry, MD   40 mg at 08/02/17 2047  . senna-docusate (Senokot-S) tablet 1 tablet  1 tablet Oral QHS PRN Leim Fabry, MD   1 tablet at 08/04/17 2206  . senna-docusate (Senokot-S) tablet 1 tablet  1 tablet Oral BID PRN Leim Fabry, MD   1 tablet at 08/03/17 2053  . sodium chloride (OCEAN) 0.65 % nasal spray 1 spray  1 spray Each Nare PRN Leim Fabry, MD  Discharge Medications: Please see discharge summary for a list of discharge medications.  Relevant Imaging Results:  Relevant Lab Results:   Additional Information  (SSN: [id number])  Ixel Boehning, Veronia Beets, LCSW

## 2017-08-05 NOTE — Discharge Instructions (Signed)
Plan will be for discharge to SNF today on oral antibiotics. Will change the woundvac today to a wet-to-dry dressing prior to the patient being discharged to rehab and new woundvac should be applied today. Change woundvac every 3 days, follow-up with Dr. Posey Pronto with [facility] in one week. Weightbear as tolerated to the left lower extremity.

## 2017-08-05 NOTE — Consult Note (Signed)
[facility] Nurse wound consult note Reason for Consult:Discharge to SNF today.  Facility has their own brand of NPWT, so we are removing the KCI dressing and replacing with NS moist gauze.  NPWT to be replaced today at facility, per MD order.  Wound type: Trauma, hematoma surgically evacuated.  Pressure Injury POA: NA Measurement: 7.4 cm x 4 cm x 0.3 cm visible tendon in wound bed.   Wound FSF:SELTR red with tendon visible.  Drainage (amount, consistency, odor) Minimal serosanguinous  No odor Periwound:intact Dressing procedure/placement/frequency:Cleanse wound to left lateral leg with NS.  Apply NS moist gauze until patient arrives at SNF later today.  Replace negative pressure wound therapy at that time.   Will not follow at this time.  Please re-consult if needed.  Domenic Moras RN BSN [place] Pager [phone]

## 2017-08-05 NOTE — Progress Notes (Signed)
Subjective: 3 Days Post-Op Procedure(s) (LRB): I & D LEFT LEG HEMATOMA (Left) APPLICATION OF WOUND VAC (Left) Patient reports pain as mild this AM. Patient is well, and has had no acute complaints or problems Plan is to go Skilled nursing facility after hospital stay.   Negative for chest pain and shortness of breath Fever: no Gastrointestinal:negative for nausea and vomiting  Objective: Vital signs in last 24 hours: Temp:  [98.3 F (36.8 C)-98.6 F (37 C)] 98.4 F (36.9 C) (09/03 0913) Pulse Rate:  [61-85] 85 (09/03 0913) Resp:  [16-20] 16 (09/03 0913) BP: (140-142)/(51-68) 141/53 (09/03 0913) SpO2:  [98 %-100 %] 99 % (09/03 0913)  Intake/Output from previous day:  Intake/Output Summary (Last 24 hours) at 08/05/17 0921 Last data filed at 08/05/17 0330  Gross per 24 hour  Intake              480 ml  Output              775 ml  Net             -295 ml    Intake/Output this shift: No intake/output data recorded.  Labs:  Recent Labs  08/02/17 1251 08/03/17 0457 08/04/17 0448 08/05/17 0501  HGB 11.9* 11.8* 10.8* 10.6*    Recent Labs  08/04/17 0448 08/05/17 0501  WBC 13.1* 8.4  RBC 3.40* 3.30*  HCT 31.9* 30.8*  PLT 322 300    Recent Labs  08/04/17 0448 08/05/17 0501  NA 133* 135  K 3.9 3.7  CL 99* 103  CO2 27 28  BUN 30* 22*  CREATININE 0.89 0.73  GLUCOSE 119* 87  CALCIUM 8.3* 8.0*   No results for input(s): LABPT, INR in the last 72 hours.   EXAM General - Patient is Alert, Appropriate and Oriented Extremity - ABD soft Sensation intact distally Dorsiflexion/Plantar flexion intact Dressing/Incision - Woundvac intact with excellent seal. Motor Function - intact, moving foot and toes well on exam, able to dorsiflex and plantarflex the ankle without much pain.  Abdomen soft with normal BS, no pain with palpation.  Pt reports that she is passing gas.  Past Medical History:  Diagnosis Date  . Anxiety   . Arthritis   . Atrial fibrillation  ([place])   . Breast cancer [facility]) 2013   right breast, radiation  . Breast cancer ([place]) 2007   left breast, radiation  . Cancer ([place])    BL breast  . CHF (congestive heart failure) ([place])   . Coronary artery disease   . Diabetes mellitus without complication ([place])   . DVT (deep venous thrombosis) ([place]) 2003  . Dyspnea    with exertion  . Dysrhythmia   . GERD (gastroesophageal reflux disease)   . Hyperlipemia   . Hypertension   . Stroke [facility]) 2007   TIA  . Traumatic hematoma of left lower leg 07/2017    Assessment/Plan: 3 Days Post-Op Procedure(s) (LRB): I & D LEFT LEG HEMATOMA (Left) APPLICATION OF WOUND VAC (Left) Active Problems:   Hematoma of leg  Estimated body mass index is 27.65 kg/m as calculated from the following:   Height as of this encounter: 4\' 11"  (1.499 m).   Weight as of this encounter: 62.1 kg (136 lb 14.4 oz). Advance diet Up with therapy D/C IV fluids when tolerating po intake.  Labs reviewed, Hg 10.6 Hypokalemia resolved. Cultures still not demonstrating any growth this AM. WBC 8.4 today.  Plan will be for discharge to SNF today on oral antibiotics.  Will change the woundvac today to a wet-to-dry dressing prior to the patient being discharged to rehab and new woundvac should be applied today. Change woundvac every 3 days, follow-up with Dr. Posey Pronto with [facility] in one week.  DVT Prophylaxis - Foot Pumps and Eliquis starting today. Weight-Bearing as tolerated to left leg  J. Cameron Proud, PA-C [facility] 08/05/2017, 9:21 AM

## 2017-08-05 NOTE — Clinical Social Work Placement (Signed)
   CLINICAL SOCIAL WORK PLACEMENT  NOTE  Date:  08/05/2017  Patient Details  Name: Rachael Horne MRN: [id number] Date of Birth: 1928/03/19  Clinical Social Work is seeking post-discharge placement for this patient at the [place] level of care (*CSW will initial, date and re-position this form in  chart as items are completed):  Yes   Patient/family provided with [place] Work Department's list of facilities offering this level of care within the geographic area requested by the patient (or if unable, by the patient's family).  Yes   Patient/family informed of their freedom to choose among providers that offer the needed level of care, that participate in Medicare, Medicaid or managed care program needed by the patient, have an available bed and are willing to accept the patient.  Yes   Patient/family informed of [place]'s ownership interest in [facility] and [facility], as well as of the fact that they are under no obligation to receive care at these facilities.  PASRR submitted to EDS on       PASRR number received on       Existing PASRR number confirmed on 08/03/17     FL2 transmitted to all facilities in geographic area requested by pt/family on 08/03/17     FL2 transmitted to all facilities within larger geographic area on       Patient informed that his/her managed care company has contracts with or will negotiate with certain facilities, including the following:        Yes   Patient/family informed of bed offers received.  Patient chooses bed at  ([facility] )     Physician recommends and patient chooses bed at      Patient to be transferred to  ([facility] ) on 08/05/17.  Patient to be transferred to facility by  [facility] )     Patient family notified on 08/05/17 of transfer.  Name of family member notified:   (Patient's son [name] was at bedside and aware of D/C today. )     PHYSICIAN       Additional Comment:     _______________________________________________ Cloyd Ragas, Veronia Beets, LCSW 08/05/2017, 11:25 AM

## 2017-08-05 NOTE — Discharge Summary (Signed)
Physician Discharge Summary  Patient ID: Rachael Horne MRN: [id number] DOB/AGE: 81-05-1928 81 y.o.  Admit date: 08/02/2017 Discharge date: 08/05/2017  Admission Diagnoses:  hematoma of left lower extremity  Discharge Diagnoses: Patient Active Problem List   Diagnosis Date Noted  . Hematoma of leg 08/02/2017  . Sepsis ([place]) 11/03/2015  . Acute bronchitis 11/03/2015  . Fever 11/03/2015  . Atrial fibrillation with RVR ([place]) 11/03/2015  . A-fib ([place]) 08/22/2015  . Primary cancer of upper outer quadrant of right female breast ([place]) 08/22/2015  . Diabetes mellitus, type 2 ([place]) 08/22/2015  . Allergy to environmental factors 08/22/2015  . History of knee problem 08/22/2015  . HLD (hyperlipidemia) 08/22/2015  . BP (high blood pressure) 08/22/2015  . MI (mitral incompetence) 08/22/2015  . Arthritis of hand, degenerative 08/22/2015  . TI (tricuspid incompetence) 08/22/2015  . Bradycardia 08/23/2014  . Arteriosclerosis of coronary artery 08/23/2014  . History of asthma 08/23/2014  . H/O deep venous thrombosis 08/23/2014  . Arthritis, degenerative 08/23/2014  . Adiposity 08/23/2014  . Temporary cerebral vascular dysfunction 08/23/2014  . Awareness of heartbeats 08/23/2014  . Absolute anemia 05/06/2014  . Cerebrovascular accident, old 07/03/1997    Past Medical History:  Diagnosis Date  . Anxiety   . Arthritis   . Atrial fibrillation ([place])   . Breast cancer [facility]) 2013   right breast, radiation  . Breast cancer ([place]) 2007   left breast, radiation  . Cancer ([place])    BL breast  . CHF (congestive heart failure) ([place])   . Coronary artery disease   . Diabetes mellitus without complication ([place])   . DVT (deep venous thrombosis) ([place]) 2003  . Dyspnea    with exertion  . Dysrhythmia   . GERD (gastroesophageal reflux disease)   . Hyperlipemia   . Hypertension   . Stroke [facility]) 2007   TIA  . Traumatic hematoma of left lower leg 07/2017   Transfusion: None.   Consultants  (if any):   Discharged Condition: Improved  Hospital Course: Rachael Horne is an 83 y.o. female who was admitted 08/02/2017 with a diagnosis of a left leg hematoma and went to the operating room on 08/02/2017 and underwent the above named procedures.    Surgeries: Procedure(s): I & D LEFT LEG HEMATOMA APPLICATION OF WOUND VAC on 08/02/2017 Patient tolerated the surgery well. Taken to PACU where she was stabilized and then transferred to the orthopedic floor.  Started back on Eliquis 5mg  twice daily two days following surgery. Foot pumps applied bilaterally at 80 mm. Heels elevated on bed with rolled towels. No evidence of DVT. Negative [place]. Physical therapy started on day #1 for gait training and transfer. OT started day #1 for ADL and assisted devices.  Patient's IVF was removed on POD1.  She was continued on IV Cleocin 600 mg every 8 hours and was switched over to the oral version prior to discharge.  Implants: None.  She was given perioperative antibiotics:  Anti-infectives    Start     Dose/Rate Route Frequency Ordered Stop   08/05/17 0000  clindamycin (CLEOCIN) 300 MG capsule     600 mg Oral 3 times daily 08/05/17 0926     08/02/17 2200  clindamycin (CLEOCIN) IVPB 600 mg     600 mg 100 mL/hr over 30 Minutes Intravenous Every 8 hours 08/02/17 2012     08/02/17 1712  clindamycin (CLEOCIN) 900 MG/50ML IVPB    Comments:  Rachael Horne   : cabinet override  08/02/17 1712 08/02/17 1820    .  She was given sequential compression devices, early ambulation, and Eliquis for DVT prophylaxis.  She benefited maximally from the hospital stay and there were no complications.    Recent vital signs:  Vitals:   08/04/17 2045 08/05/17 0913  BP: (!) 140/51 (!) 141/53  Pulse: 61 85  Resp: 20 16  Temp: 98.6 F (37 C) 98.4 F (36.9 C)  SpO2: 100% 99%    Recent laboratory studies:  Lab Results  Component Value Date   HGB 10.6 (L) 08/05/2017   HGB 10.8 (L) 08/04/2017   HGB 11.8  (L) 08/03/2017   Lab Results  Component Value Date   WBC 8.4 08/05/2017   PLT 300 08/05/2017   Lab Results  Component Value Date   INR 1.19 01/20/2017   Lab Results  Component Value Date   NA 135 08/05/2017   K 3.7 08/05/2017   CL 103 08/05/2017   CO2 28 08/05/2017   BUN 22 (H) 08/05/2017   CREATININE 0.73 08/05/2017   GLUCOSE 87 08/05/2017    Discharge Medications:   Allergies as of 08/05/2017      Reactions   Amoxicillin Rash      Medication List    STOP taking these medications   dabigatran 150 MG Caps capsule Commonly known as:  PRADAXA     TAKE these medications   acetaminophen 500 MG tablet Commonly known as:  TYLENOL Take 1,000 mg by mouth 2 (two) times daily as needed for mild pain.   albuterol 108 (90 Base) MCG/ACT inhaler Commonly known as:  PROVENTIL HFA;VENTOLIN HFA Inhale 2 puffs into the lungs every 6 (six) hours as needed for wheezing or shortness of breath.   alendronate 70 MG tablet Commonly known as:  FOSAMAX Take 1 tablet (70 mg total) by mouth once a week. Take with a full glass of water on an empty stomach.   ALPRAZolam 0.25 MG tablet Commonly known as:  XANAX Take 0.5 tablets (0.125 mg total) by mouth at bedtime as needed for anxiety or sleep.   anastrozole 1 MG tablet Commonly known as:  ARIMIDEX TAKE ONE (1) TABLET BY MOUTH EVERY DAY   budesonide-formoterol 160-4.5 MCG/ACT inhaler Commonly known as:  SYMBICORT Inhale 2 puffs into the lungs daily.   CALCIUM-VITAMIN D-VITAMIN K PO Take 1 tablet by mouth 2 (two) times daily.   clindamycin 300 MG capsule Commonly known as:  CLEOCIN Take 2 capsules (600 mg total) by mouth 3 (three) times daily.   cyclobenzaprine 10 MG tablet Commonly known as:  FLEXERIL Take 0.5 tablets (5 mg total) by mouth at bedtime.   diltiazem 240 MG 24 hr capsule Commonly known as:  DILACOR XR Take 240 mg by mouth daily.   diphenhydramine-acetaminophen 25-500 MG Tabs tablet Commonly known as:  TYLENOL  PM Take 1 tablet by mouth at bedtime as needed (for sleep).   ELIQUIS 5 MG Tabs tablet Generic drug:  apixaban Take 5 mg by mouth 2 (two) times daily.   fluticasone 50 MCG/ACT nasal spray Commonly known as:  FLONASE Place 2 sprays into both nostrils as needed.   furosemide 40 MG tablet Commonly known as:  LASIX Take 40 mg by mouth 2 (two) times daily.   glimepiride 4 MG tablet Commonly known as:  AMARYL Take 4 mg by mouth daily.   GLUCOSAMINE-CHONDROITIN PO Take 1 tablet by mouth daily.   loratadine 10 MG tablet Commonly known as:  CLARITIN Take 10 mg by mouth  daily as needed for allergies.   losartan 50 MG tablet Commonly known as:  COZAAR Take 25 mg by mouth daily.   lovastatin 40 MG tablet Commonly known as:  MEVACOR Take 40 mg by mouth at bedtime.   magnesium oxide 400 MG tablet Commonly known as:  MAG-OX Take 400 mg by mouth daily at 12 noon.   metolazone 2.5 MG tablet Commonly known as:  ZAROXOLYN Take 2.5 mg by mouth 3 (three) times a week. Mon. Wed. Fri   montelukast 10 MG tablet Commonly known as:  SINGULAIR Take 10 mg by mouth at bedtime.   multivitamin with minerals Tabs tablet Take 1 tablet by mouth daily at 12 noon.   oxyCODONE 5 MG immediate release tablet Commonly known as:  Oxy IR/ROXICODONE Take 1 tablet (5 mg total) by mouth every 6 (six) hours as needed for moderate pain or severe pain.   pantoprazole 40 MG tablet Commonly known as:  PROTONIX Take 40 mg by mouth as needed.   potassium chloride 10 MEQ tablet Commonly known as:  K-DUR Take 10 mEq by mouth daily at 12 noon.   senna-docusate 8.6-50 MG tablet Commonly known as:  Senokot-S Take 1 tablet by mouth 2 (two) times daily as needed for mild constipation or moderate constipation.   sodium chloride 0.65 % Soln nasal spray Commonly known as:  OCEAN Place 1 spray into both nostrils as needed for congestion.            Discharge Care Instructions        Start     Ordered    08/05/17 0000  oxyCODONE (OXY IR/ROXICODONE) 5 MG immediate release tablet  Every 6 hours PRN    Question:  Supervising Provider  Answer:  Rachael Horne   08/05/17 [id number]   08/05/17 0000  clindamycin (CLEOCIN) 300 MG capsule  3 times daily    Question:  Supervising Provider  Answer:  Rachael Horne   08/05/17 [id number]      Diagnostic Studies: US Venous Img Lower Unilateral Left  Result Date: 07/18/2017 CLINICAL DATA:  Left leg pain EXAM: LEFT LOWER EXTREMITY VENOUS DOPPLER ULTRASOUND TECHNIQUE: Gray-scale sonography with graded compression, as well as color Doppler and duplex ultrasound were performed to evaluate the lower extremity deep venous systems from the level of the common femoral vein and including the common femoral, femoral, profunda femoral, popliteal and calf veins including the posterior tibial, peroneal and gastrocnemius veins when visible. The superficial great saphenous vein was also interrogated. Spectral Doppler was utilized to evaluate flow at rest and with distal augmentation maneuvers in the common femoral, femoral and popliteal veins. COMPARISON:  None. FINDINGS: Contralateral Common Femoral Vein: Respiratory phasicity is normal and symmetric with the symptomatic side. No evidence of thrombus. Normal compressibility. Common Femoral Vein: No evidence of thrombus. Normal compressibility, respiratory phasicity and response to augmentation. Saphenofemoral Junction: No evidence of thrombus. Normal compressibility and flow on color Doppler imaging. Profunda Femoral Vein: No evidence of thrombus. Normal compressibility and flow on color Doppler imaging. Femoral Vein: No evidence of thrombus. Normal compressibility, respiratory phasicity and response to augmentation. Popliteal Vein: No evidence of thrombus. Normal compressibility, respiratory phasicity and response to augmentation. Calf Veins: No evidence of thrombus. Normal compressibility and flow on color Doppler imaging. Superficial Great  Saphenous Vein: No evidence of thrombus. Normal compressibility and flow on color Doppler imaging. Venous Reflux:  None. Other Findings: Hypoechoic, possibly complex fluid collection noted in the left lateral ankle area in the area  of color changes measuring 6.6 x 3.2 x 1.5 cm, question hematoma. IMPRESSION: No evidence of DVT within the left lower extremity. Possible large hematoma in the left lateral ankle region in the area of skin color changes. Electronically Signed   By: Rolm Baptise M.D.   On: 07/18/2017 12:02   Disposition: Discharge to SNF today.  Plan will be for discharge to SNF today on oral antibiotics. Will change the woundvac today to a wet-to-dry dressing prior to the patient being discharged to rehab and new woundvac should be applied today. Change woundvac every 3 days, follow-up with Dr. Posey Pronto with [facility] in one week.   Contact information for follow-up providers    Leim Fabry, MD Follow up in 7 day(s).   Specialty:  Orthopedic Surgery Why:  Wound check. Contact information: [place] [address] [phone]            Contact information for after-discharge care    Destination    [facility] Follow up.   Specialty:  [place] information: [address] [place] [phone]                 Signed: Judson Roch PA-C 08/05/2017, 1:24 PM

## 2017-08-05 NOTE — Care Management Important Message (Signed)
Important Message  Patient Details  Name: Rachael Horne MRN: [id number] Date of Birth: 07-28-1928   Medicare Important Message Given:  Yes    Jolly Mango, RN 08/05/2017, 10:10 AM

## 2017-08-05 NOTE — Progress Notes (Signed)
Patient is medically stable for D/C to [facility] today. Per Larence Penning PA patient will D/C on PO ABX and can have a wet to dry dressing until [facility] gets the wound vac in. Per [facility] liaison patient can come today to room 607. RN will call report at [phone] and arrange EMS for transport. Clinical Education officer, museum (CSW) sent D/C orders to [facility] via [facility]. Patient is aware of above. Patient's son [name] is at bedside and aware of above. Please reconsult if future social work needs arise. CSW signing off.   [employer], LCSW [phone]

## 2017-08-06 NOTE — Anesthesia Postprocedure Evaluation (Signed)
Anesthesia Post Note  Patient: Rachael Horne  Procedure(s) Performed: Procedure(s) (LRB): I & D LEFT LEG HEMATOMA (Left) APPLICATION OF WOUND VAC (Left)  Patient location during evaluation: PACU Anesthesia Type: General Level of consciousness: awake and alert Pain management: pain level controlled Vital Signs Assessment: post-procedure vital signs reviewed and stable Respiratory status: spontaneous breathing, nonlabored ventilation, respiratory function stable and patient connected to nasal cannula oxygen Cardiovascular status: blood pressure returned to baseline and stable Postop Assessment: no signs of nausea or vomiting Anesthetic complications: no     Last Vitals:  Vitals:   08/05/17 0913 08/05/17 1418  BP: (!) 141/53 (!) 149/58  Pulse: 85 91  Resp: 16 18  Temp: 36.9 C 36.8 C  SpO2: 99% 97%    Last Pain:  Vitals:   08/05/17 1614  TempSrc:   PainSc: 0-No pain                 Martha Clan

## 2017-08-07 LAB — AEROBIC/ANAEROBIC CULTURE (SURGICAL/DEEP WOUND)

## 2017-08-07 LAB — AEROBIC/ANAEROBIC CULTURE W GRAM STAIN (SURGICAL/DEEP WOUND): Culture: NO GROWTH

## 2017-09-01 ENCOUNTER — Emergency Department
Admission: EM | Admit: 2017-09-01 | Discharge: 2017-09-01 | Disposition: A | Discharge status: Home / Self Care | Payer: Medicare Other | Attending: Emergency Medicine | Admitting: Emergency Medicine

## 2017-09-01 ENCOUNTER — Emergency Department: Payer: Medicare Other

## 2017-09-01 ENCOUNTER — Encounter: Payer: Self-pay | Admitting: Emergency Medicine

## 2017-09-01 DIAGNOSIS — Z853 Personal history of malignant neoplasm of breast: Secondary | ICD-10-CM | POA: Diagnosis not present

## 2017-09-01 DIAGNOSIS — E785 Hyperlipidemia, unspecified: Secondary | ICD-10-CM | POA: Insufficient documentation

## 2017-09-01 DIAGNOSIS — Z8673 Personal history of transient ischemic attack (TIA), and cerebral infarction without residual deficits: Secondary | ICD-10-CM | POA: Insufficient documentation

## 2017-09-01 DIAGNOSIS — Z86718 Personal history of other venous thrombosis and embolism: Secondary | ICD-10-CM | POA: Diagnosis not present

## 2017-09-01 DIAGNOSIS — I110 Hypertensive heart disease with heart failure: Secondary | ICD-10-CM | POA: Diagnosis not present

## 2017-09-01 DIAGNOSIS — L03116 Cellulitis of left lower limb: Secondary | ICD-10-CM | POA: Insufficient documentation

## 2017-09-01 DIAGNOSIS — I4891 Unspecified atrial fibrillation: Secondary | ICD-10-CM | POA: Diagnosis not present

## 2017-09-01 DIAGNOSIS — I509 Heart failure, unspecified: Secondary | ICD-10-CM | POA: Diagnosis not present

## 2017-09-01 DIAGNOSIS — I2510 Atherosclerotic heart disease of native coronary artery without angina pectoris: Secondary | ICD-10-CM | POA: Insufficient documentation

## 2017-09-01 DIAGNOSIS — Z79899 Other long term (current) drug therapy: Secondary | ICD-10-CM | POA: Diagnosis not present

## 2017-09-01 DIAGNOSIS — M79652 Pain in left thigh: Secondary | ICD-10-CM | POA: Insufficient documentation

## 2017-09-01 DIAGNOSIS — E876 Hypokalemia: Secondary | ICD-10-CM | POA: Diagnosis not present

## 2017-09-01 DIAGNOSIS — Z7901 Long term (current) use of anticoagulants: Secondary | ICD-10-CM | POA: Insufficient documentation

## 2017-09-01 DIAGNOSIS — E119 Type 2 diabetes mellitus without complications: Secondary | ICD-10-CM | POA: Diagnosis not present

## 2017-09-01 DIAGNOSIS — M79651 Pain in right thigh: Secondary | ICD-10-CM

## 2017-09-01 LAB — COMPREHENSIVE METABOLIC PANEL
ALT: 45 U/L (ref 14–54)
AST: 56 U/L — ABNORMAL HIGH (ref 15–41)
Albumin: 3.5 g/dL (ref 3.5–5.0)
Alkaline Phosphatase: 59 U/L (ref 38–126)
Anion gap: 10 (ref 5–15)
BUN: 15 mg/dL (ref 6–20)
CHLORIDE: 93 mmol/L — AB (ref 101–111)
CO2: 28 mmol/L (ref 22–32)
CREATININE: 0.75 mg/dL (ref 0.44–1.00)
Calcium: 8.9 mg/dL (ref 8.9–10.3)
Glucose, Bld: 134 mg/dL — ABNORMAL HIGH (ref 65–99)
POTASSIUM: 3 mmol/L — AB (ref 3.5–5.1)
Sodium: 131 mmol/L — ABNORMAL LOW (ref 135–145)
TOTAL PROTEIN: 6.5 g/dL (ref 6.5–8.1)
Total Bilirubin: 0.6 mg/dL (ref 0.3–1.2)

## 2017-09-01 LAB — CBC WITH DIFFERENTIAL/PLATELET
BASOS ABS: 0.1 10*3/uL (ref 0–0.1)
BASOS PCT: 0 %
EOS ABS: 0.1 10*3/uL (ref 0–0.7)
EOS PCT: 0 %
HCT: 36.8 % (ref 35.0–47.0)
Hemoglobin: 12.4 g/dL (ref 12.0–16.0)
Lymphocytes Relative: 5 %
Lymphs Abs: 0.6 10*3/uL — ABNORMAL LOW (ref 1.0–3.6)
MCH: 31.2 pg (ref 26.0–34.0)
MCHC: 33.8 g/dL (ref 32.0–36.0)
MCV: 92.3 fL (ref 80.0–100.0)
Monocytes Absolute: 0.4 10*3/uL (ref 0.2–0.9)
Monocytes Relative: 3 %
Neutro Abs: 12 10*3/uL — ABNORMAL HIGH (ref 1.4–6.5)
Neutrophils Relative %: 92 %
PLATELETS: 287 10*3/uL (ref 150–440)
RBC: 3.98 MIL/uL (ref 3.80–5.20)
RDW: 15.3 % — ABNORMAL HIGH (ref 11.5–14.5)
WBC: 13.1 10*3/uL — AB (ref 3.6–11.0)

## 2017-09-01 LAB — LACTIC ACID, PLASMA: LACTIC ACID, VENOUS: 1.4 mmol/L (ref 0.5–1.9)

## 2017-09-01 MED ORDER — POTASSIUM CHLORIDE 20 MEQ PO PACK
40.0000 meq | PACK | ORAL | Status: AC
  Administered 2017-09-01: 40 meq via ORAL
  Filled 2017-09-01: qty 2

## 2017-09-01 MED ORDER — POTASSIUM CHLORIDE CRYS ER 20 MEQ PO TBCR: 20.0000 meq | EXTENDED_RELEASE_TABLET | Freq: Every day | ORAL | 0 refills | Status: DC

## 2017-09-01 MED ORDER — CEPHALEXIN 500 MG PO CAPS: 500.0000 mg | ORAL_CAPSULE | Freq: Four times a day (QID) | ORAL | 0 refills | Status: DC

## 2017-09-01 MED ORDER — CEPHALEXIN 500 MG PO CAPS
500.0000 mg | ORAL_CAPSULE | Freq: Once | ORAL | Status: AC
  Administered 2017-09-01: 500 mg via ORAL
  Filled 2017-09-01: qty 1

## 2017-09-01 MED ORDER — ACETAMINOPHEN 325 MG PO TABS
650.0000 mg | ORAL_TABLET | Freq: Once | ORAL | Status: AC
  Administered 2017-09-01: 650 mg via ORAL

## 2017-09-01 MED ORDER — ONDANSETRON HCL 4 MG/2ML IJ SOLN
4.0000 mg | INTRAMUSCULAR | Status: AC
  Administered 2017-09-01: 4 mg via INTRAVENOUS
  Filled 2017-09-01: qty 2

## 2017-09-01 MED ORDER — TRAMADOL HCL 50 MG PO TABS: ORAL_TABLET | ORAL | 0 refills | Status: DC

## 2017-09-01 MED ORDER — ACETAMINOPHEN 325 MG PO TABS
ORAL_TABLET | ORAL | Status: AC
  Administered 2017-09-01: 650 mg via ORAL
  Filled 2017-09-01: qty 2

## 2017-09-01 NOTE — ED Notes (Signed)
Pt just took the tylenol. Will reassess pain prior to discharge.

## 2017-09-01 NOTE — Discharge Instructions (Signed)
As we discussed, your workup today was reassuring.  Though we do not know exactly what is causing your symptoms, it appears that you have no emergent medical condition at this time and that you are safe to go home and follow up as recommended in this paperwork.  We prescribed an antibiotic for the area of redness around the wound vac which appears to be infection (cellulitis), and we encourage you to drink plenty of fluids and take your potassium supplements because your potassium level was low today and may contribute to muscle spasms and cramps.  Please return immediately to the Emergency Department if you develop any new or worsening symptoms that concern you.

## 2017-09-01 NOTE — ED Notes (Addendum)
Post IV admin of 4 mg zofran, this RN again tried to get the patient to take the po tylenol and potassium; pt is still refusing to take either; this RN informed the patient and family to wait about 10 minutes for the zofran to take effect and then see if the patient is able to tolerate the po meds. Pt's son states he will try to get her to take the medicine.

## 2017-09-01 NOTE — ED Notes (Signed)
Pt's son states she is not taking the po tylenol and potassium due to "feeling sick to her stomach"; this RN informed the patient and family, that MD will be made aware and we'll get her something for the nausea.

## 2017-09-01 NOTE — ED Triage Notes (Signed)
Pt presents to ED 05 via EMS from home c/o leg pain in the left thigh; pt is awake, alert and oriented x4; pt has a wound vac on lateral aspect of the lower left leg; pt denies any pain at the site of the wound vac;

## 2017-09-01 NOTE — ED Provider Notes (Signed)
[facility] Emergency Department Provider Note  ____________________________________________   First MD Initiated Contact with Patient 09/01/17 [phone]     (approximate)  I have reviewed the triage vital signs and the nursing notes.   HISTORY  Chief Complaint Leg Pain    HPI NOCOLE ZAMMIT is a 83 y.o. female who presents by EMS for evaluation of acute pain in her left thigh.  She has had a complicated recent medical course.  She was admitted at [facility] 3-4 weeks ago with a left lower leg hematoma which required surgery and she now has a wound VAC.  [facility].  She reports thatshe has been having episodic cramping pain in her left thigh, both front and back.  Having her family members read it and massage the muscle seems to help.  Sometimes moving around makes it worse.  She describes it as severe at its worst and absent at its best.  She denies fever/chills, chest pain, shortness of breath, nausea, vomiting, and abdominal pain.  She has some redness around the wound VAC but it seems to have been the same for a couple of days.  There is no lower extremity swelling compared to baseline.  Of note, in addition to being significantly immobilized compared to baseline, she was taken off of Eliquis 3-4 weeks agowhen she was having acute bleeding.  She has plans to see the surgeon in 2 days for a follow-up appointment.  She also has home health and occupational therapy coming out tomorrow.   Past Medical History:  Diagnosis Date  . Anxiety   . Arthritis   . Atrial fibrillation ([place])   . Breast cancer [facility]) 2013   right breast, radiation  . Breast cancer ([place]) 2007   left breast, radiation  . Cancer ([place])    BL breast  . CHF (congestive heart failure) ([place])   . Coronary artery disease   . Diabetes mellitus without complication ([place])   . DVT (deep venous thrombosis) ([place]) 2003  . Dyspnea    with exertion  .  Dysrhythmia   . GERD (gastroesophageal reflux disease)   . Hyperlipemia   . Hypertension   . Stroke [facility]) 2007   TIA  . Traumatic hematoma of left lower leg 07/2017    Patient Active Problem List   Diagnosis Date Noted  . Hematoma of leg 08/02/2017  . Sepsis ([place]) 11/03/2015  . Acute bronchitis 11/03/2015  . Fever 11/03/2015  . Atrial fibrillation with RVR ([place]) 11/03/2015  . A-fib ([place]) 08/22/2015  . Primary cancer of upper outer quadrant of right female breast ([place]) 08/22/2015  . Diabetes mellitus, type 2 ([place]) 08/22/2015  . Allergy to environmental factors 08/22/2015  . History of knee problem 08/22/2015  . HLD (hyperlipidemia) 08/22/2015  . BP (high blood pressure) 08/22/2015  . MI (mitral incompetence) 08/22/2015  . Arthritis of hand, degenerative 08/22/2015  . TI (tricuspid incompetence) 08/22/2015  . Bradycardia 08/23/2014  . Arteriosclerosis of coronary artery 08/23/2014  . History of asthma 08/23/2014  . H/O deep venous thrombosis 08/23/2014  . Arthritis, degenerative 08/23/2014  . Adiposity 08/23/2014  . Temporary cerebral vascular dysfunction 08/23/2014  . Awareness of heartbeats 08/23/2014  . Absolute anemia 05/06/2014  . Cerebrovascular accident, old 07/03/1997    Past Surgical History:  Procedure Laterality Date  . APPENDECTOMY    . APPLICATION OF WOUND VAC Left 08/02/2017   Procedure: APPLICATION OF WOUND VAC;  Surgeon: Leim Fabry,  MD;  Location: [facility] ORS;  Service: Orthopedics;  Laterality: Left;  . BILATERAL CARPAL TUNNEL RELEASE Bilateral   . BREAST LUMPECTOMY     removal of CA  . CARDIAC CATHETERIZATION    . CHOLECYSTECTOMY    . EYE SURGERY Bilateral    Cataract Extraction with IOL  . HERNIA REPAIR     Umbilical Hernia Repair  . I&D EXTREMITY Left 08/02/2017   Procedure: I & D LEFT LEG HEMATOMA;  Surgeon: Leim Fabry, MD;  Location: [facility] ORS;  Service: Orthopedics;  Laterality: Left;  . TONSILLECTOMY      Prior to Admission medications     Medication Sig Start Date End Date Taking? Authorizing Provider  acetaminophen (TYLENOL) 500 MG tablet Take 1,000 mg by mouth 2 (two) times daily as needed for mild pain.    [provider]  albuterol (PROVENTIL HFA;VENTOLIN HFA) 108 (90 BASE) MCG/ACT inhaler Inhale 2 puffs into the lungs every 6 (six) hours as needed for wheezing or shortness of breath.    [provider]  alendronate (FOSAMAX) 70 MG tablet Take 1 tablet (70 mg total) by mouth once a week. Take with a full glass of water on an empty stomach. 06/13/17   Lloyd Huger, MD  ALPRAZolam Duanne Moron) 0.25 MG tablet Take 0.5 tablets (0.125 mg total) by mouth at bedtime as needed for anxiety or sleep. 11/07/15   Aldean Jewett, MD  anastrozole (ARIMIDEX) 1 MG tablet TAKE ONE (1) TABLET BY MOUTH EVERY DAY 04/10/17   Lloyd Huger, MD  apixaban (ELIQUIS) 5 MG TABS tablet Take 5 mg by mouth 2 (two) times daily.    [provider]  budesonide-formoterol (SYMBICORT) 160-4.5 MCG/ACT inhaler Inhale 2 puffs into the lungs daily.     [provider]  CALCIUM-VITAMIN D-VITAMIN K PO Take 1 tablet by mouth 2 (two) times daily.    [provider]  cephALEXin (KEFLEX) 500 MG capsule Take 1 capsule (500 mg total) by mouth 4 (four) times daily. 09/01/17   Hinda Kehr, MD  clindamycin (CLEOCIN) 300 MG capsule Take 2 capsules (600 mg total) by mouth 3 (three) times daily. 08/05/17   Lattie Corns, PA-C  cyclobenzaprine (FLEXERIL) 10 MG tablet Take 0.5 tablets (5 mg total) by mouth at bedtime. Patient not taking: Reported on 07/30/2017 01/20/17   Merlyn Lot, MD  diltiazem (DILACOR XR) 240 MG 24 hr capsule Take 240 mg by mouth daily.    [provider]  diphenhydramine-acetaminophen (TYLENOL PM) 25-500 MG TABS Take 1 tablet by mouth at bedtime as needed (for sleep).    [provider]  fluticasone (FLONASE) 50 MCG/ACT nasal spray Place 2 sprays into both nostrils as needed.      [provider]  furosemide (LASIX) 40 MG tablet Take 40 mg by mouth 2 (two) times daily.     [provider]  glimepiride (AMARYL) 4 MG tablet Take 4 mg by mouth daily.    [provider]  GLUCOSAMINE-CHONDROITIN PO Take 1 tablet by mouth daily.    [provider]  loratadine (CLARITIN) 10 MG tablet Take 10 mg by mouth daily as needed for allergies.    [provider]  losartan (COZAAR) 50 MG tablet Take 25 mg by mouth daily.     [provider]  lovastatin (MEVACOR) 40 MG tablet Take 40 mg by mouth at bedtime.    [provider]  magnesium oxide (MAG-OX) 400 MG tablet Take 400 mg by mouth daily at  12 noon.    [provider]  metolazone (ZAROXOLYN) 2.5 MG tablet Take 2.5 mg by mouth 3 (three) times a week. Mon. Wed. Fri    [provider]  montelukast (SINGULAIR) 10 MG tablet Take 10 mg by mouth at bedtime.    [provider]  Multiple Vitamin (MULTIVITAMIN WITH MINERALS) TABS tablet Take 1 tablet by mouth daily at 12 noon.    [provider]  oxyCODONE (OXY IR/ROXICODONE) 5 MG immediate release tablet Take 1 tablet (5 mg total) by mouth every 6 (six) hours as needed for moderate pain or severe pain. 08/05/17   Lattie Corns, PA-C  pantoprazole (PROTONIX) 40 MG tablet Take 40 mg by mouth as needed.     [provider]  potassium chloride (K-DUR) 10 MEQ tablet Take 10 mEq by mouth daily at 12 noon.    [provider]  potassium chloride SA (KLOR-CON M20) 20 MEQ tablet Take 1 tablet (20 mEq total) by mouth daily. 09/01/17   Hinda Kehr, MD  senna-docusate (SENOKOT-S) 8.6-50 MG tablet Take 1 tablet by mouth 2 (two) times daily as needed for mild constipation or moderate constipation. 11/07/15   Aldean Jewett, MD  sodium chloride (OCEAN) 0.65 % SOLN nasal spray Place 1 spray into both nostrils as needed for congestion. 11/07/15   Aldean Jewett, MD  traMADol Veatrice Bourbon) 50 MG  tablet Take 1-2 tablets by mouth every 6 hours as needed for moderate to severe pain 09/01/17   Hinda Kehr, MD    Allergies Amoxicillin  No family history on file.  Social History Social History  Substance Use Topics  . Smoking status: Never Smoker  . Smokeless tobacco: Never Used  . Alcohol use No    Review of Systems Constitutional: No fever/chills Cardiovascular: Denies chest pain. Respiratory: Denies shortness of breath. Gastrointestinal: No abdominal pain.  No nausea, no vomiting.  No diarrhea.  No constipation. Genitourinary: Negative for dysuria. Musculoskeletal: acute episodic pain in her left thigh. Negative for neck pain.  Negative for back pain. Integumentary: Negative for rash. Neurological: Negative for headaches, focal weakness or numbness.   ____________________________________________   PHYSICAL EXAM:  VITAL SIGNS: ED Triage Vitals  Enc Vitals Group     BP 09/01/17 0336 (!) 178/61     Pulse Rate 09/01/17 0336 89     Resp 09/01/17 0336 18     Temp 09/01/17 0336 98.9 F (37.2 C)     Temp Source 09/01/17 0336 Oral     SpO2 09/01/17 0336 94 %     Weight 09/01/17 0336 61.2 kg (135 lb)     Height 09/01/17 0336 1.499 m (4\' 11" )     Head Circumference --      Peak Flow --      Pain Score 09/01/17 0335 5     Pain Loc --      Pain Edu? --      Excl. in [place]? --     Constitutional: Alert and oriented. Well appearing and in no acute distress although she is anxious. Eyes: Conjunctivae are normal.  Head: Atraumatic. Nose: No congestion/rhinnorhea. Cardiovascular: Normal rate, regular rhythm. Good peripheral circulation. Grossly normal heart sounds. Respiratory: Normal respiratory effort.  No retractions. Lungs CTAB. Gastrointestinal: Soft and nontender. No distention.  Musculoskeletal: wound VAC in place on lateral left lower leg. there is some surrounding erythema and warmth possibly consistent with cellulitis or at least some venous stasis, but there is  no pitting edema.  The  patient is anxious and jumps and cries out whenever I touch her thigh muscles, but with distraction it is evident there is no tenderness to palpation and no gross deformities of the left upper leg.she is moving it without difficulty. Neurologic:  Normal speech and language. No gross focal neurologic deficits are appreciated.  Skin:  Skin is warm, dry and intact. No rash noted. Psychiatric: Mood and affect are anxious. Speech and behavior are normal.  ____________________________________________   LABS (all labs ordered are listed, but only abnormal results are displayed)  Labs Reviewed  COMPREHENSIVE METABOLIC PANEL - Abnormal; Notable for the following:       Result Value   Sodium 131 (*)    Potassium 3.0 (*)    Chloride 93 (*)    Glucose, Bld 134 (*)    AST 56 (*)    All other components within normal limits  CBC WITH DIFFERENTIAL/PLATELET - Abnormal; Notable for the following:    WBC 13.1 (*)    RDW 15.3 (*)    Neutro Abs 12.0 (*)    Lymphs Abs 0.6 (*)    All other components within normal limits  LACTIC ACID, PLASMA   ____________________________________________  EKG  None - EKG not ordered by ED physician ____________________________________________  RADIOLOGY   US Venous Img Lower Unilateral Left  Result Date: 09/01/2017 CLINICAL DATA:  Pain in thigh, recent immobilization, hx of DVT, taken off Eliquis about 3-4 weeks ago EXAM: LEFT LOWER EXTREMITY VENOUS DOPPLER ULTRASOUND TECHNIQUE: Gray-scale sonography with graded compression, as well as color Doppler and duplex ultrasound were performed to evaluate the lower extremity deep venous systems from the level of the common femoral vein and including the common femoral, femoral, profunda femoral, popliteal and calf veins including the posterior tibial, peroneal and gastrocnemius veins when visible. The superficial great saphenous vein was also interrogated. Spectral Doppler was utilized to evaluate  flow at rest and with distal augmentation maneuvers in the common femoral, femoral and popliteal veins. COMPARISON:  Left lower extremity duplex 07/18/2017 FINDINGS: Contralateral Common Femoral Vein: Respiratory phasicity is normal and symmetric with the symptomatic side. No evidence of thrombus. Normal compressibility. Common Femoral Vein: No evidence of thrombus. Normal compressibility, respiratory phasicity and response to augmentation. Saphenofemoral Junction: No evidence of thrombus. Normal compressibility and flow on color Doppler imaging. Profunda Femoral Vein: No evidence of thrombus. Normal compressibility and flow on color Doppler imaging. Femoral Vein: No evidence of thrombus. Normal compressibility, respiratory phasicity and response to augmentation. Popliteal Vein: No evidence of thrombus. Normal compressibility, respiratory phasicity and response to augmentation. Calf Veins: No evidence of thrombus. Normal compressibility and flow on color Doppler imaging. Superficial Great Saphenous Vein: No evidence of thrombus. Normal compressibility and flow on color Doppler imaging. Venous Reflux:  None. Other Findings:  None. IMPRESSION: No evidence of DVT within the left lower extremity. Electronically Signed   By: Jeb Levering M.D.   On: 09/01/2017 05:43    ____________________________________________   PROCEDURES  Critical Care performed: No   Procedure(s) performed:   Procedures   ____________________________________________   INITIAL IMPRESSION / ASSESSMENT AND PLAN / ED COURSE  Pertinent labs & imaging results that were available during my care of the patient were reviewed by me and considered in my medical decision making (see chart for details).   differential diagnosis includes but is not limited to DVT, infection, muscle cramps, new hematoma, etc.  She does have some possible cellulitis around the site of the wound VAC and her lab work  is notable for a mild leukocytosis  which she did not have previously during her hospitalization.  Her potassium is low at 3.0 and I will give her a dose of oral potassium.  I will evaluate with an ultrasound to rule out DVT and then reassess. Clinical Course as of Sep 01 718  Sun Sep 01, 2017  0509 Lactic Acid, Venous: 1.4 [CF]  0606 No evidence of DVT on U/S according to radiologist's report. US Venous Img Lower Unilateral Left [CF]  0712 I updated family about the results.  I will start patient on Keflex for the cellulitis around the wound site, although I do not think this is related to the intermittent cramping in her thigh.  I encouraged the family to have her take the potassium supplements as prescribed and follow up with her regular doctor, as well as her surgeon and outpatient therapists, at the next available opportunity.  [CF]  0714 Prescribing Tramadol for pain  [CF]    Clinical Course User Index [CF] Hinda Kehr, MD    ____________________________________________  FINAL CLINICAL IMPRESSION(S) / ED DIAGNOSES  Final diagnoses:  Right thigh pain  Hypokalemia  Cellulitis of left lower extremity     MEDICATIONS GIVEN DURING THIS VISIT:  Medications  cephALEXin (KEFLEX) capsule 500 mg (not administered)  potassium chloride (KLOR-CON) packet 40 mEq (40 mEq Oral Given 09/01/17 0524)  acetaminophen (TYLENOL) tablet 650 mg (650 mg Oral Given 09/01/17 0524)  ondansetron (ZOFRAN) injection 4 mg (4 mg Intravenous Given 09/01/17 0603)     NEW OUTPATIENT MEDICATIONS STARTED DURING THIS VISIT:  New Prescriptions   CEPHALEXIN (KEFLEX) 500 MG CAPSULE    Take 1 capsule (500 mg total) by mouth 4 (four) times daily.   POTASSIUM CHLORIDE SA (KLOR-CON M20) 20 MEQ TABLET    Take 1 tablet (20 mEq total) by mouth daily.   TRAMADOL (ULTRAM) 50 MG TABLET    Take 1-2 tablets by mouth every 6 hours as needed for moderate to severe pain    Modified Medications   No medications on file    Discontinued Medications   No  medications on file     Note:  This document was prepared using Dragon voice recognition software and may include unintentional dictation errors.    Hinda Kehr, MD 09/01/17 [phone]

## 2017-09-01 NOTE — ED Notes (Signed)
Pt getting ultrasound done in the room starting at this time.

## 2017-09-01 NOTE — ED Notes (Signed)
This RN went into the patient's room to see if she had taken the po tylenol and potassium; pt had not yet taken those medications. This RN informed the patient and family that pt's blood work shows low level of potassium so it is advisable for her to take the medication. Pt's son said he will try to get her to take the medication.

## 2017-09-01 NOTE — ED Notes (Signed)
Pt slow but ambulatory to wheelchair upon discharge. With family. Pt and family verbalized understanding of rx, d/c instructions and follow-up instructions. Pt a&o x4. Skin warm and dry.

## 2017-09-06 ENCOUNTER — Other Ambulatory Visit: Payer: Self-pay | Admitting: *Deleted

## 2017-09-06 DIAGNOSIS — C50919 Malignant neoplasm of unspecified site of unspecified female breast: Secondary | ICD-10-CM

## 2017-09-08 NOTE — Progress Notes (Deleted)
[place]  [phone] [phone]  ID: Rachael Horne OB: Oct 26, 1928  MR#: [id number]  CSN#:[id number]  Patient Care Team: Idelle Crouch, MD as PCP - General (Internal Medicine)  CHIEF COMPLAINT: Pathologic stage Ia ER/PR posiitve, HER-2 negative adenocarcinoma of the upper outer quadrant of the right breast.  INTERVAL HISTORY: Patient returns to clinic today for routine yearly evaluation. She is tolerating anastrozole well without significant side effects. She currently feels well and is at her baseline. She has no neurologic complaints. She denies any recent fevers. She has a good appetite and denies weight loss. She has no chest pain or shortness of breath. She denies any nausea, vomiting, constipation, or diarrhea. She has no urinary complaints. Patient offers no specific complaints today.  REVIEW OF SYSTEMS:   Review of Systems  Constitutional: Negative.  Negative for fever, malaise/fatigue and weight loss.  Respiratory: Negative.  Negative for cough and shortness of breath.   Cardiovascular: Negative.  Negative for chest pain and leg swelling.  Gastrointestinal: Negative.  Negative for abdominal pain.  Genitourinary: Negative.   Musculoskeletal: Negative.   Neurological: Negative.  Negative for sensory change and weakness.  Psychiatric/Behavioral: Negative.  The patient is not nervous/anxious.     As per HPI. Otherwise, a complete review of systems is negative.  PAST MEDICAL HISTORY: Past Medical History:  Diagnosis Date  . Anxiety   . Arthritis   . Atrial fibrillation ([place])   . Breast cancer [facility]) 2013   right breast, radiation  . Breast cancer ([place]) 2007   left breast, radiation  . Cancer ([name])    BL breast  . CHF (congestive heart failure) ([place])   . Coronary artery disease   . Diabetes mellitus without complication ([place])   . DVT (deep venous thrombosis) ([place]) 2003  . Dyspnea    with exertion  . Dysrhythmia   . GERD  (gastroesophageal reflux disease)   . Hyperlipemia   . Hypertension   . Stroke [facility]) 2007   TIA  . Traumatic hematoma of left lower leg 07/2017    PAST SURGICAL HISTORY: Past Surgical History:  Procedure Laterality Date  . APPENDECTOMY    . APPLICATION OF WOUND VAC Left 08/02/2017   Procedure: APPLICATION OF WOUND VAC;  Surgeon: Leim Fabry, MD;  Location: [facility] ORS;  Service: Orthopedics;  Laterality: Left;  . BILATERAL CARPAL TUNNEL RELEASE Bilateral   . BREAST LUMPECTOMY     removal of CA  . CARDIAC CATHETERIZATION    . CHOLECYSTECTOMY    . EYE SURGERY Bilateral    Cataract Extraction with IOL  . HERNIA REPAIR     Umbilical Hernia Repair  . I&D EXTREMITY Left 08/02/2017   Procedure: I & D LEFT LEG HEMATOMA;  Surgeon: Leim Fabry, MD;  Location: [facility] ORS;  Service: Orthopedics;  Laterality: Left;  . TONSILLECTOMY      FAMILY HISTORY: Reviewed and unchanged. No reported history of malignancy or chronic disease.  ADVANCED DIRECTIVES (Y/N):  N  HEALTH MAINTENANCE: Social History  Substance Use Topics  . Smoking status: Never Smoker  . Smokeless tobacco: Never Used  . Alcohol use No     Colonoscopy:  PAP:  Bone density:  Lipid panel:  Allergies  Allergen Reactions  . Amoxicillin Rash    Current Outpatient Prescriptions  Medication Sig Dispense Refill  . acetaminophen (TYLENOL) 500 MG tablet Take 1,000 mg by mouth 2 (two) times daily as needed for mild pain.    Marland Kitchen albuterol (PROVENTIL HFA;VENTOLIN  HFA) 108 (90 BASE) MCG/ACT inhaler Inhale 2 puffs into the lungs every 6 (six) hours as needed for wheezing or shortness of breath.    Marland Kitchen alendronate (FOSAMAX) 70 MG tablet Take 1 tablet (70 mg total) by mouth once a week. Take with a full glass of water on an empty stomach. 12 tablet 3  . ALPRAZolam (XANAX) 0.25 MG tablet Take 0.5 tablets (0.125 mg total) by mouth at bedtime as needed for anxiety or sleep. 30 tablet 0  . anastrozole (ARIMIDEX) 1 MG tablet TAKE ONE (1)  TABLET BY MOUTH EVERY DAY 90 tablet 3  . apixaban (ELIQUIS) 5 MG TABS tablet Take 5 mg by mouth 2 (two) times daily.    . budesonide-formoterol (SYMBICORT) 160-4.5 MCG/ACT inhaler Inhale 2 puffs into the lungs daily.     Marland Kitchen CALCIUM-VITAMIN D-VITAMIN K PO Take 1 tablet by mouth 2 (two) times daily.    . cephALEXin (KEFLEX) 500 MG capsule Take 1 capsule (500 mg total) by mouth 4 (four) times daily. 40 capsule 0  . clindamycin (CLEOCIN) 300 MG capsule Take 2 capsules (600 mg total) by mouth 3 (three) times daily. 42 capsule 0  . cyclobenzaprine (FLEXERIL) 10 MG tablet Take 0.5 tablets (5 mg total) by mouth at bedtime. (Patient not taking: Reported on 07/30/2017) 6 tablet 0  . diltiazem (DILACOR XR) 240 MG 24 hr capsule Take 240 mg by mouth daily.    . diphenhydramine-acetaminophen (TYLENOL PM) 25-500 MG TABS Take 1 tablet by mouth at bedtime as needed (for sleep).    . fluticasone (FLONASE) 50 MCG/ACT nasal spray Place 2 sprays into both nostrils as needed.     . furosemide (LASIX) 40 MG tablet Take 40 mg by mouth 2 (two) times daily.     Marland Kitchen glimepiride (AMARYL) 4 MG tablet Take 4 mg by mouth daily.    Marland Kitchen GLUCOSAMINE-CHONDROITIN PO Take 1 tablet by mouth daily.    Marland Kitchen loratadine (CLARITIN) 10 MG tablet Take 10 mg by mouth daily as needed for allergies.    Marland Kitchen losartan (COZAAR) 50 MG tablet Take 25 mg by mouth daily.     Marland Kitchen lovastatin (MEVACOR) 40 MG tablet Take 40 mg by mouth at bedtime.    . magnesium oxide (MAG-OX) 400 MG tablet Take 400 mg by mouth daily at 12 noon.    . metolazone (ZAROXOLYN) 2.5 MG tablet Take 2.5 mg by mouth 3 (three) times a week. Mon. Wed. Fri    . montelukast (SINGULAIR) 10 MG tablet Take 10 mg by mouth at bedtime.    . Multiple Vitamin (MULTIVITAMIN WITH MINERALS) TABS tablet Take 1 tablet by mouth daily at 12 noon.    Marland Kitchen oxyCODONE (OXY IR/ROXICODONE) 5 MG immediate release tablet Take 1 tablet (5 mg total) by mouth every 6 (six) hours as needed for moderate pain or severe pain. 30  tablet 0  . pantoprazole (PROTONIX) 40 MG tablet Take 40 mg by mouth as needed.     . potassium chloride (K-DUR) 10 MEQ tablet Take 10 mEq by mouth daily at 12 noon.    . potassium chloride SA (KLOR-CON M20) 20 MEQ tablet Take 1 tablet (20 mEq total) by mouth daily. 7 tablet 0  . senna-docusate (SENOKOT-S) 8.6-50 MG tablet Take 1 tablet by mouth 2 (two) times daily as needed for mild constipation or moderate constipation. 30 tablet 0  . sodium chloride (OCEAN) 0.65 % SOLN nasal spray Place 1 spray into both nostrils as needed for congestion. 1 Bottle 0  .  traMADol (ULTRAM) 50 MG tablet Take 1-2 tablets by mouth every 6 hours as needed for moderate to severe pain 20 tablet 0   No current facility-administered medications for this visit.     OBJECTIVE: There were no vitals filed for this visit.   There is no height or weight on file to calculate BMI.    ECOG FS:2 - Symptomatic, <50% confined to bed  General: Well-developed, well-nourished, no acute distress. Sitting in a wheelchair. Eyes: Pink conjunctiva, anicteric sclera. Breasts: Patient requested exam be deferred today. Lungs: Clear to auscultation bilaterally. Heart: Regular rate and rhythm. No rubs, murmurs, or gallops. Abdomen: Soft, nontender, nondistended. No organomegaly noted, normoactive bowel sounds. Musculoskeletal: No edema, cyanosis, or clubbing. Neuro: Alert, answering all questions appropriately. Cranial nerves grossly intact. Skin: No rashes or petechiae noted. Psych: Normal affect.   LAB RESULTS:  Lab Results  Component Value Date   NA 131 (L) 09/01/2017   K 3.0 (L) 09/01/2017   CL 93 (L) 09/01/2017   CO2 28 09/01/2017   GLUCOSE 134 (H) 09/01/2017   BUN 15 09/01/2017   CREATININE 0.75 09/01/2017   CALCIUM 8.9 09/01/2017   PROT 6.5 09/01/2017   ALBUMIN 3.5 09/01/2017   AST 56 (H) 09/01/2017   ALT 45 09/01/2017   ALKPHOS 59 09/01/2017   BILITOT 0.6 09/01/2017   GFRNONAA >60 09/01/2017   GFRAA >60  09/01/2017    Lab Results  Component Value Date   WBC 13.1 (H) 09/01/2017   NEUTROABS 12.0 (H) 09/01/2017   HGB 12.4 09/01/2017   HCT 36.8 09/01/2017   MCV 92.3 09/01/2017   PLT 287 09/01/2017   Lab Results  Component Value Date   LABCA2 32.7 09/06/2016     STUDIES: US Venous Img Lower Unilateral Left  Result Date: 09/01/2017 CLINICAL DATA:  Pain in thigh, recent immobilization, hx of DVT, taken off Eliquis about 3-4 weeks ago EXAM: LEFT LOWER EXTREMITY VENOUS DOPPLER ULTRASOUND TECHNIQUE: Gray-scale sonography with graded compression, as well as color Doppler and duplex ultrasound were performed to evaluate the lower extremity deep venous systems from the level of the common femoral vein and including the common femoral, femoral, profunda femoral, popliteal and calf veins including the posterior tibial, peroneal and gastrocnemius veins when visible. The superficial great saphenous vein was also interrogated. Spectral Doppler was utilized to evaluate flow at rest and with distal augmentation maneuvers in the common femoral, femoral and popliteal veins. COMPARISON:  Left lower extremity duplex 07/18/2017 FINDINGS: Contralateral Common Femoral Vein: Respiratory phasicity is normal and symmetric with the symptomatic side. No evidence of thrombus. Normal compressibility. Common Femoral Vein: No evidence of thrombus. Normal compressibility, respiratory phasicity and response to augmentation. Saphenofemoral Junction: No evidence of thrombus. Normal compressibility and flow on color Doppler imaging. Profunda Femoral Vein: No evidence of thrombus. Normal compressibility and flow on color Doppler imaging. Femoral Vein: No evidence of thrombus. Normal compressibility, respiratory phasicity and response to augmentation. Popliteal Vein: No evidence of thrombus. Normal compressibility, respiratory phasicity and response to augmentation. Calf Veins: No evidence of thrombus. Normal compressibility and flow on  color Doppler imaging. Superficial Great Saphenous Vein: No evidence of thrombus. Normal compressibility and flow on color Doppler imaging. Venous Reflux:  None. Other Findings:  None. IMPRESSION: No evidence of DVT within the left lower extremity. Electronically Signed   By: Jeb Levering M.D.   On: 09/01/2017 05:43    ASSESSMENT: Pathologic stage Ia ER/PR positive, HER-2 negative adenocarcinoma of the upper outer quadrant of the right  breast.  PLAN:    1. Pathologic stage Ia ER/PR positive, HER-2 negative adenocarcinoma of the upper outer quadrant of the right breast: Patient underwent lumpectomy on September 01, 2012. She subsequently completed adjuvant XRT and then initiated anastrozole in approximately January 2014. She will complete 5 years of treatment in January 2019. Her most recent mammogram on May 16, 2016 was reported as BI-RADS 2. Repeat in June 2018. Given patient's advanced age and decreased performance status, will minimize follow-up and return to clinic in 1 year for routine evaluation. 2. Osteoporosis: Patient's most recent bone mineral density on May 16, 2016 reported a T score of -2.8. Continue calcium and vitamin D supplementation. Repeat bone mineral density in June 2018.   Patient expressed understanding and was in agreement with this plan. She also understands that She can call clinic at any time with any questions, concerns, or complaints.   Cancer Staging Primary cancer of upper outer quadrant of right female breast [facility]) Staging form: Breast, AJCC 7th Edition - Clinical stage from 09/05/2016: Stage IA (T1b, N0, M0) - Signed by Lloyd Huger, MD on 09/05/2016   Lloyd Huger, MD   09/08/2017 8:47 AM

## 2017-09-09 ENCOUNTER — Inpatient Hospital Stay: Payer: Medicare Other | Admitting: Oncology

## 2017-09-09 ENCOUNTER — Inpatient Hospital Stay: Payer: Medicare Other

## 2017-10-07 ENCOUNTER — Ambulatory Visit: Payer: Medicare Other | Admitting: Surgery

## 2017-10-10 IMAGING — US US EXTREM LOW VENOUS*L*
1 series · 13 of 24 positions shown · non-contrast
Comparison: None.

CLINICAL DATA: Left leg pain



[Series 1: us extrem low venous*left* · 0.07mm/px · 13 of 64 slices shown]
[im 1/64]
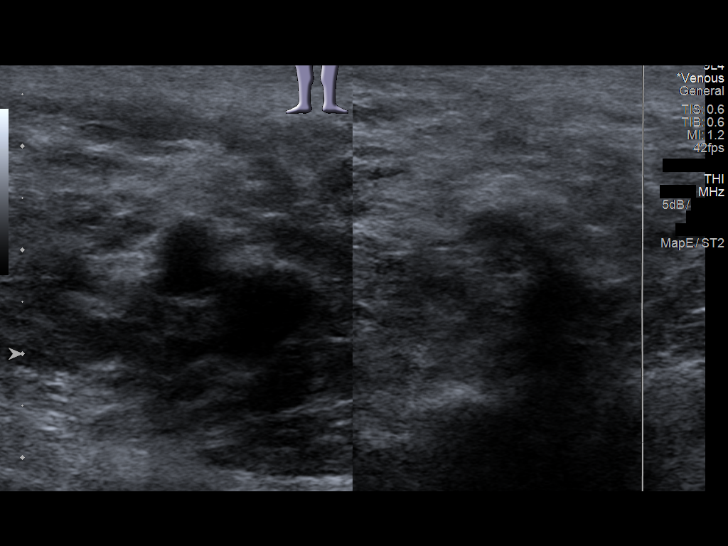
[im 6/64]
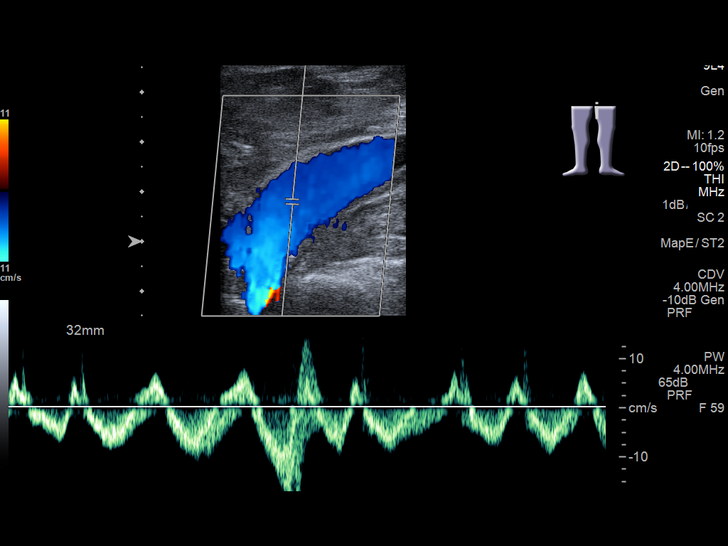
[im 11/64]
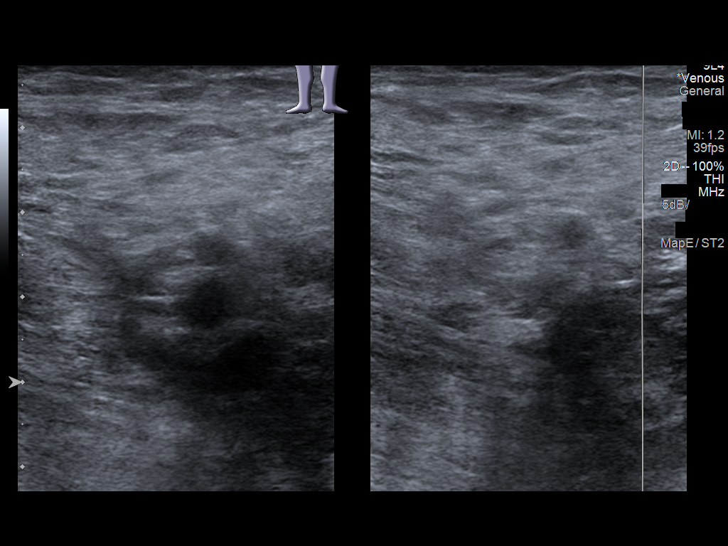
[im 17/64]
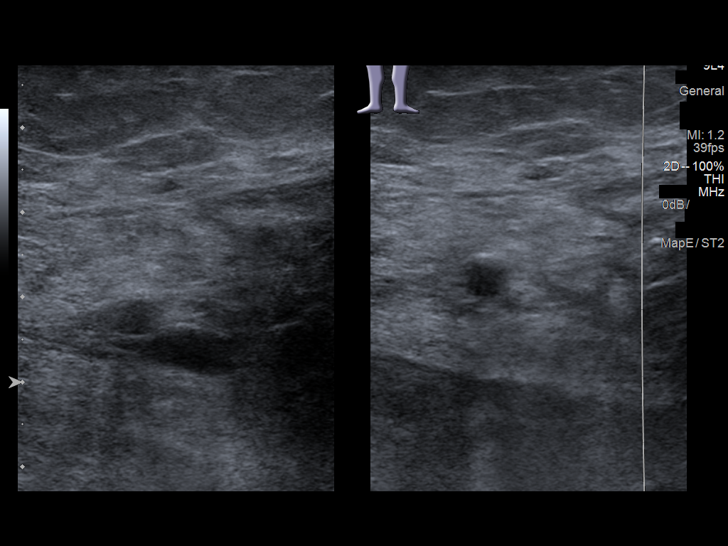
[im 22/64]
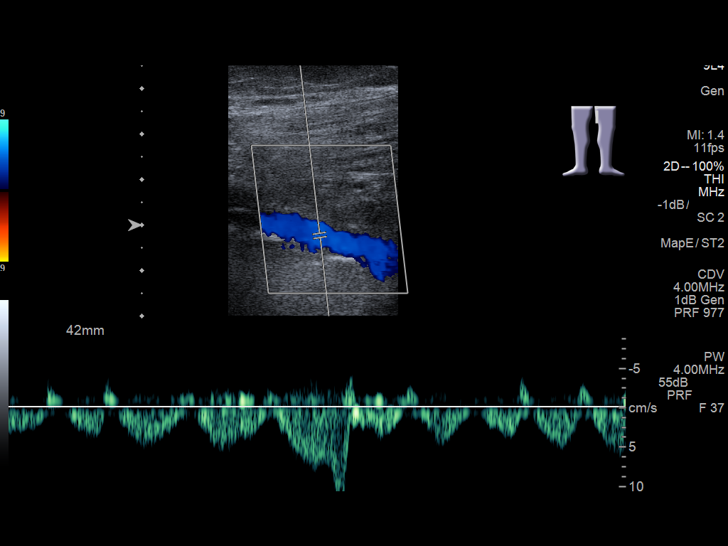
[im 28/64]
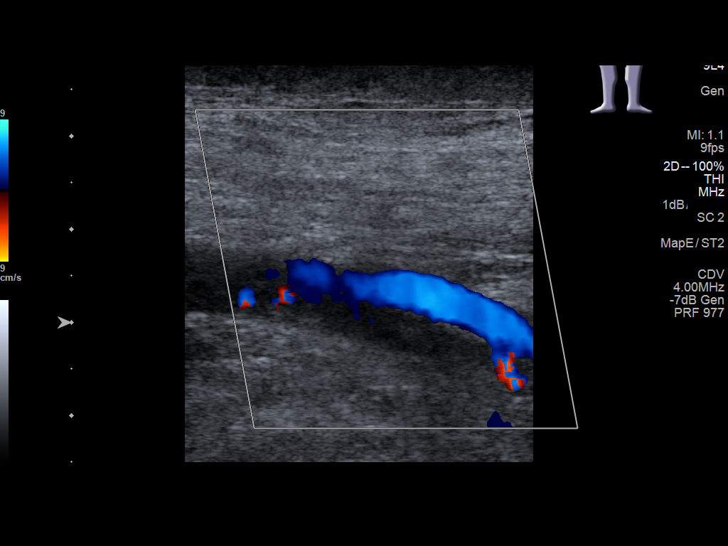
[im 33/64]
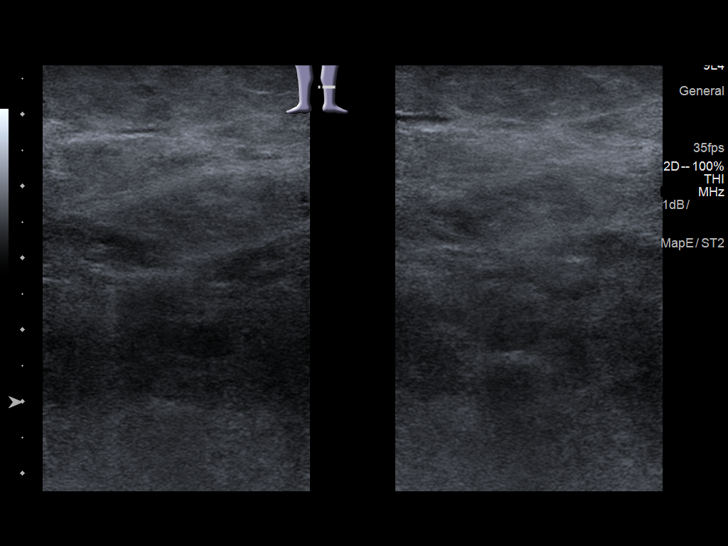
[im 36/64]
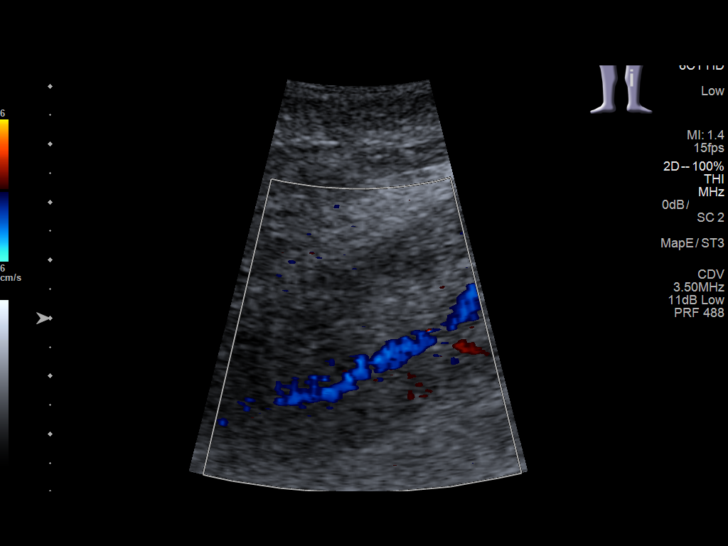
[im 42/64]
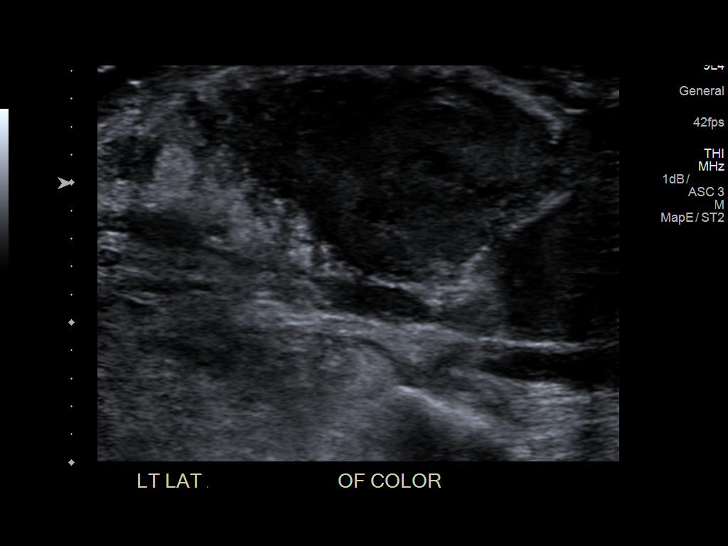
[im 47/64]
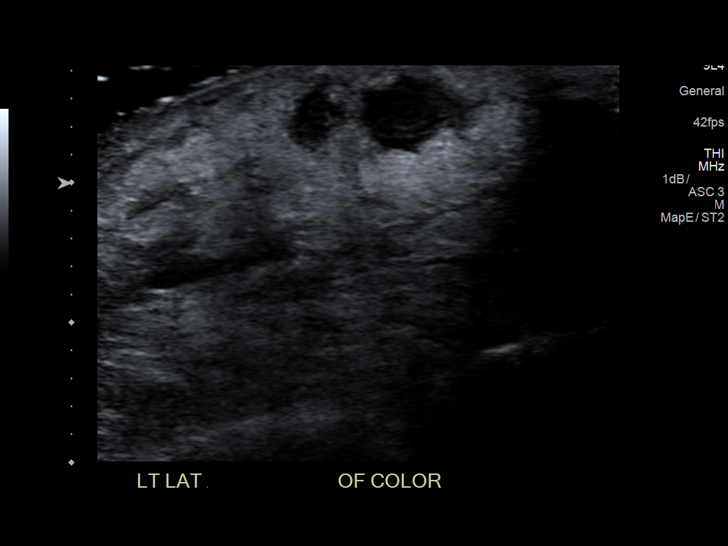
[im 53/64]
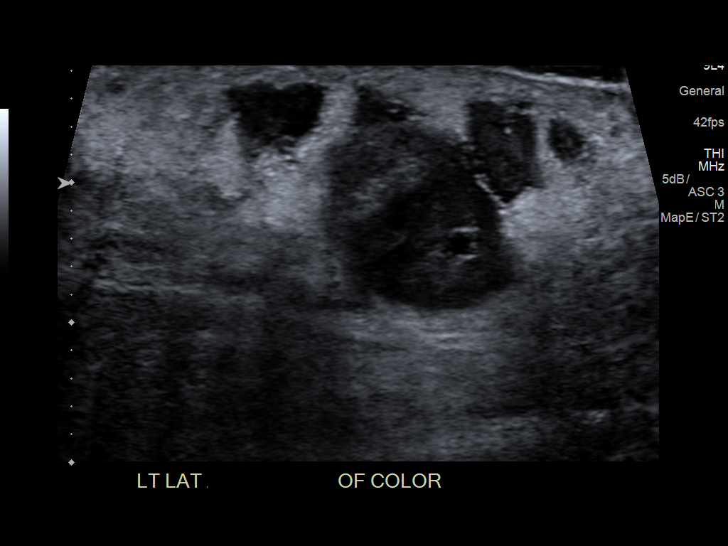
[im 58/64]
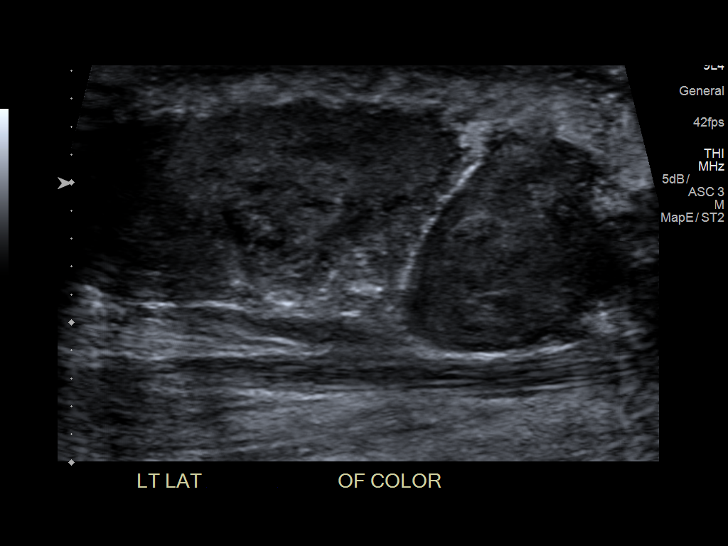
[im 64/64]
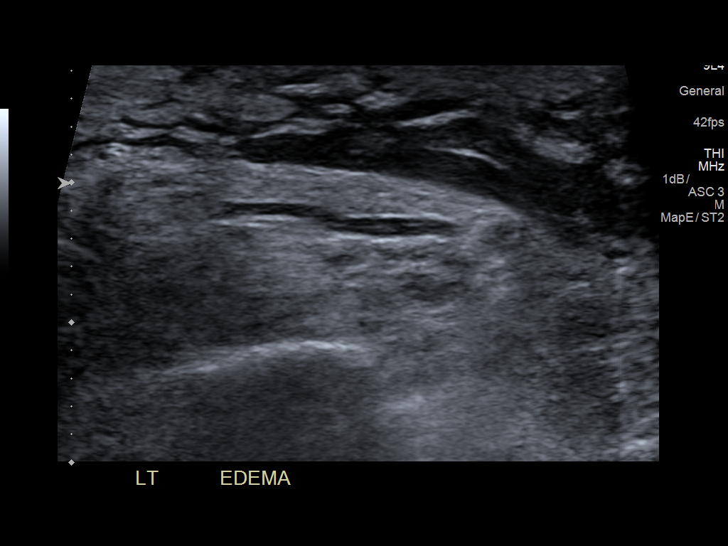

[13 of 24 positions shown; findings below may reference images not displayed]

FINDINGS: Contralateral Common Femoral Vein: Respiratory phasicity is normal
and symmetric with the symptomatic side. No evidence of thrombus.
Normal compressibility.

Common Femoral Vein: No evidence of thrombus. Normal
compressibility, respiratory phasicity and response to augmentation.

Saphenofemoral Junction: No evidence of thrombus. Normal
compressibility and flow on color Doppler imaging.

Profunda Femoral Vein: No evidence of thrombus. Normal
compressibility and flow on color Doppler imaging.

Femoral Vein: No evidence of thrombus. Normal compressibility,
respiratory phasicity and response to augmentation.

Popliteal Vein: No evidence of thrombus. Normal compressibility,
respiratory phasicity and response to augmentation.

Calf Veins: No evidence of thrombus. Normal compressibility and flow
on color Doppler imaging.

Superficial Great Saphenous Vein: No evidence of thrombus. Normal
compressibility and flow on color Doppler imaging.

Venous Reflux:  None.

Other Findings: Hypoechoic, possibly complex fluid collection noted
in the left lateral ankle area in the area of color changes
measuring 6.6 x 3.2 x 1.5 cm, question hematoma.
IMPRESSION: No evidence of DVT within the left lower extremity.

Possible large hematoma in the left lateral ankle region in the area
of skin color changes.

## 2017-12-20 ENCOUNTER — Ambulatory Visit
Admission: RE | Admit: 2017-12-20 | Discharge: 2017-12-20 | Disposition: A | Discharge status: Home / Self Care | Payer: Medicare Other | Source: Ambulatory Visit | Attending: Family Medicine | Admitting: Family Medicine

## 2017-12-20 ENCOUNTER — Other Ambulatory Visit: Payer: Self-pay | Admitting: Family Medicine

## 2017-12-20 DIAGNOSIS — M79604 Pain in right leg: Secondary | ICD-10-CM

## 2017-12-20 DIAGNOSIS — R600 Localized edema: Secondary | ICD-10-CM | POA: Insufficient documentation

## 2018-02-14 ENCOUNTER — Encounter (INDEPENDENT_AMBULATORY_CARE_PROVIDER_SITE_OTHER): Payer: Self-pay | Admitting: Vascular Surgery

## 2018-03-09 NOTE — Progress Notes (Signed)
[place]  Telephone:(336) [phone] [phone]) [phone]  ID: Barnie Alderman OB: 1927-12-29  MR#: [id number]  MWN#:[id number]  Patient Care Team: Idelle Crouch, MD as PCP - General (Internal Medicine)  CHIEF COMPLAINT: Pathologic stage Ia ER/PR posiitve, HER-2 negative adenocarcinoma of the upper outer quadrant of the right breast.  INTERVAL HISTORY: Patient returns to clinic today for routine yearly evaluation. She continues to tolerate anastrozole well without significant side effects. She currently feels well and is asymptomatic. She has chronic weakness and fatigue which she attributes to her advanced age.  She has no neurologic complaints. She denies any recent fevers or illnesses. She has a good appetite and denies weight loss. She has no chest pain or shortness of breath. She denies any nausea, vomiting, constipation, or diarrhea. She has no urinary complaints. Patient offers no specific complaints today.  REVIEW OF SYSTEMS:   Review of Systems  Constitutional: Positive for malaise/fatigue. Negative for fever and weight loss.  Respiratory: Negative.  Negative for cough and shortness of breath.   Cardiovascular: Negative.  Negative for chest pain and leg swelling.  Gastrointestinal: Negative.  Negative for abdominal pain.  Genitourinary: Negative.   Musculoskeletal: Negative.  Negative for joint pain.  Skin: Negative.   Neurological: Positive for weakness. Negative for sensory change and focal weakness.  Psychiatric/Behavioral: Negative.  The patient is not nervous/anxious and does not have insomnia.     As per HPI. Otherwise, a complete review of systems is negative.  PAST MEDICAL HISTORY: Past Medical History:  Diagnosis Date  . Anxiety   . Arthritis   . Atrial fibrillation ([place])   . Breast cancer [facility]) 2013   right breast, radiation  . Breast cancer ([place]) 2007   left breast, radiation  . Cancer ([place])    BL breast  . CHF (congestive heart  failure) ([place])   . Coronary artery disease   . Diabetes mellitus without complication ([place])   . DVT (deep venous thrombosis) ([place]) 2003  . Dyspnea    with exertion  . Dysrhythmia   . GERD (gastroesophageal reflux disease)   . Hyperlipemia   . Hypertension   . Stroke [facility]) 2007   TIA  . Traumatic hematoma of left lower leg 07/2017    PAST SURGICAL HISTORY: Past Surgical History:  Procedure Laterality Date  . APPENDECTOMY    . APPLICATION OF WOUND VAC Left 08/02/2017   Procedure: APPLICATION OF WOUND VAC;  Surgeon: Leim Fabry, MD;  Location: [facility] ORS;  Service: Orthopedics;  Laterality: Left;  . BILATERAL CARPAL TUNNEL RELEASE Bilateral   . BREAST LUMPECTOMY     removal of CA  . CARDIAC CATHETERIZATION    . CHOLECYSTECTOMY    . EYE SURGERY Bilateral    Cataract Extraction with IOL  . HERNIA REPAIR     Umbilical Hernia Repair  . I&D EXTREMITY Left 08/02/2017   Procedure: I & D LEFT LEG HEMATOMA;  Surgeon: Leim Fabry, MD;  Location: [facility] ORS;  Service: Orthopedics;  Laterality: Left;  . TONSILLECTOMY      FAMILY HISTORY: Reviewed and unchanged. No reported history of malignancy or chronic disease.  ADVANCED DIRECTIVES (Y/N):  N  HEALTH MAINTENANCE: Social History   Tobacco Use  . Smoking status: Never Smoker  . Smokeless tobacco: Never Used  Substance Use Topics  . Alcohol use: No  . Drug use: No     Colonoscopy:  PAP:  Bone density:  Lipid panel:  Allergies  Allergen Reactions  . Amoxicillin Rash  Current Outpatient Medications  Medication Sig Dispense Refill  . acetaminophen (TYLENOL) 500 MG tablet Take 1,000 mg by mouth 2 (two) times daily as needed for mild pain.    Marland Kitchen albuterol (PROVENTIL HFA;VENTOLIN HFA) 108 (90 BASE) MCG/ACT inhaler Inhale 2 puffs into the lungs every 6 (six) hours as needed for wheezing or shortness of breath.    Marland Kitchen alendronate (FOSAMAX) 70 MG tablet Take 1 tablet (70 mg total) by mouth once a week. Take with a full glass of  water on an empty stomach. 12 tablet 3  . ALPRAZolam (XANAX) 0.25 MG tablet Take 0.5 tablets (0.125 mg total) by mouth at bedtime as needed for anxiety or sleep. 30 tablet 0  . anastrozole (ARIMIDEX) 1 MG tablet TAKE ONE (1) TABLET BY MOUTH EVERY DAY 90 tablet 3  . apixaban (ELIQUIS) 5 MG TABS tablet Take 5 mg by mouth 2 (two) times daily.    . budesonide-formoterol (SYMBICORT) 160-4.5 MCG/ACT inhaler Inhale 2 puffs into the lungs daily.     Marland Kitchen CALCIUM-VITAMIN D-VITAMIN K PO Take 1 tablet by mouth 2 (two) times daily.    . clotrimazole-betamethasone (LOTRISONE) cream Apply topically.    . cyclobenzaprine (FLEXERIL) 10 MG tablet Take 0.5 tablets (5 mg total) by mouth at bedtime. 6 tablet 0  . desloratadine (CLARINEX) 5 MG tablet Take by mouth.    . diltiazem (DILACOR XR) 240 MG 24 hr capsule Take 240 mg by mouth daily.    . diphenhydramine-acetaminophen (TYLENOL PM) 25-500 MG TABS Take 1 tablet by mouth at bedtime as needed (for sleep).    . DULoxetine (CYMBALTA) 30 MG capsule     . fluticasone (FLONASE) 50 MCG/ACT nasal spray Place 2 sprays into both nostrils as needed.     . furosemide (LASIX) 40 MG tablet Take 40 mg by mouth 2 (two) times daily.     Marland Kitchen gabapentin (NEURONTIN) 100 MG capsule     . glimepiride (AMARYL) 4 MG tablet Take 4 mg by mouth daily.    Marland Kitchen GLUCOSAMINE-CHONDROITIN PO Take 1 tablet by mouth daily.    Marland Kitchen glucose blood test strip TEST BLOOD SUGAR DAILY AS DIRECTED    . loratadine (CLARITIN) 10 MG tablet Take 10 mg by mouth daily as needed for allergies.    Marland Kitchen losartan (COZAAR) 50 MG tablet Take 25 mg by mouth daily.     Marland Kitchen lovastatin (MEVACOR) 40 MG tablet Take 40 mg by mouth at bedtime.    . magnesium oxide (MAG-OX) 400 MG tablet Take 400 mg by mouth daily at 12 noon.    . metolazone (ZAROXOLYN) 2.5 MG tablet Take 2.5 mg by mouth 3 (three) times a week. Mon. Wed. Fri    . montelukast (SINGULAIR) 10 MG tablet Take 10 mg by mouth at bedtime.    . Multiple Vitamin (MULTIVITAMIN WITH  MINERALS) TABS tablet Take 1 tablet by mouth daily at 12 noon.    . pantoprazole (PROTONIX) 40 MG tablet Take 40 mg by mouth as needed.     . potassium chloride (K-DUR) 10 MEQ tablet Take 10 mEq by mouth daily at 12 noon.    . potassium chloride SA (KLOR-CON M20) 20 MEQ tablet Take 1 tablet (20 mEq total) by mouth daily. 7 tablet 0  . senna-docusate (SENOKOT-S) 8.6-50 MG tablet Take 1 tablet by mouth 2 (two) times daily as needed for mild constipation or moderate constipation. 30 tablet 0  . sodium chloride (OCEAN) 0.65 % SOLN nasal spray Place 1 spray into both  nostrils as needed for congestion. 1 Bottle 0  . traMADol (ULTRAM) 50 MG tablet Take 1-2 tablets by mouth every 6 hours as needed for moderate to severe pain 20 tablet 0  . triamcinolone cream (KENALOG) 0.1 % Apply topically.     No current facility-administered medications for this visit.     OBJECTIVE: Vitals:   03/11/18 1030  BP: (!) 151/67  Pulse: 89     Body mass index is 28.11 kg/m.    ECOG FS:1 - Symptomatic but completely ambulatory  General: Well-developed, well-nourished, no acute distress. Eyes: Pink conjunctiva, anicteric sclera. Breast: Bilateral breast and axilla without lumps or masses. Lungs: Clear to auscultation bilaterally. Heart: Regular rate and rhythm. No rubs, murmurs, or gallops. Abdomen: Soft, nontender, nondistended. No organomegaly noted, normoactive bowel sounds. Musculoskeletal: No edema, cyanosis, or clubbing. Neuro: Alert, answering all questions appropriately. Cranial nerves grossly intact. Skin: No rashes or petechiae noted. Psych: Normal affect.   LAB RESULTS:  Lab Results  Component Value Date   NA 131 (L) 09/01/2017   K 3.0 (L) 09/01/2017   CL 93 (L) 09/01/2017   CO2 28 09/01/2017   GLUCOSE 134 (H) 09/01/2017   BUN 15 09/01/2017   CREATININE 0.75 09/01/2017   CALCIUM 8.9 09/01/2017   PROT 6.5 09/01/2017   ALBUMIN 3.5 09/01/2017   AST 56 (H) 09/01/2017   ALT 45 09/01/2017    ALKPHOS 59 09/01/2017   BILITOT 0.6 09/01/2017   GFRNONAA >60 09/01/2017   GFRAA >60 09/01/2017    Lab Results  Component Value Date   WBC 13.1 (H) 09/01/2017   NEUTROABS 12.0 (H) 09/01/2017   HGB 12.4 09/01/2017   HCT 36.8 09/01/2017   MCV 92.3 09/01/2017   PLT 287 09/01/2017   Lab Results  Component Value Date   LABCA2 32.7 09/06/2016     STUDIES: No results found.  ASSESSMENT: Pathologic stage Ia ER/PR positive, HER-2 negative adenocarcinoma of the upper outer quadrant of the right breast.  PLAN:    1. Pathologic stage Ia ER/PR positive, HER-2 negative adenocarcinoma of the upper outer quadrant of the right breast: Patient underwent lumpectomy on September 01, 2012. She subsequently completed adjuvant XRT and then initiated anastrozole in approximately January 2014. She has now taken 5 years of anastrozole and has been instructed to complete her current prescription and then discontinue.   Her most recent mammogram on May 20, 2017 was reported as BI-RADS 2. Repeat in June 2019. Given patient's advanced age and decreased performance status, will minimize follow-up and return to clinic in 1 year for routine evaluation.  Given patient's low stage of disease and advanced age, can consider discharging from clinic after her evaluation in 1 year. 2. Osteoporosis: Patient's most recent bone mineral density on May 20, 2017 reported a T score of -2.5.  Which is improved from -2.8 1-year prior.  Continue calcium and vitamin D supplementation. Repeat bone mineral density in June 2019.  Approximately 20 minutes spent in discussion of which greater than 50% was consultation.   Patient expressed understanding and was in agreement with this plan. She also understands that She can call clinic at any time with any questions, concerns, or complaints.   Cancer Staging Primary cancer of upper outer quadrant of right female breast [facility]) Staging form: Breast, AJCC 7th Edition - Clinical stage  from 09/05/2016: Stage IA (T1b, N0, M0) - Signed by Lloyd Huger, MD on 09/05/2016   Lloyd Huger, MD   03/14/2018 10:58 AM

## 2018-03-11 ENCOUNTER — Inpatient Hospital Stay: Payer: Medicare Other | Attending: Oncology | Admitting: Oncology

## 2018-03-11 ENCOUNTER — Other Ambulatory Visit: Payer: Self-pay

## 2018-03-11 ENCOUNTER — Encounter: Payer: Self-pay | Admitting: Oncology

## 2018-03-11 ENCOUNTER — Inpatient Hospital Stay: Payer: Medicare Other

## 2018-03-11 VITALS — BP 151/67 | HR 89 | Wt 139.2 lb

## 2018-03-11 DIAGNOSIS — C50411 Malignant neoplasm of upper-outer quadrant of right female breast: Secondary | ICD-10-CM

## 2018-03-11 DIAGNOSIS — M818 Other osteoporosis without current pathological fracture: Secondary | ICD-10-CM | POA: Insufficient documentation

## 2018-03-11 DIAGNOSIS — Z170 Estrogen receptor positive status [ER+]: Secondary | ICD-10-CM | POA: Insufficient documentation

## 2018-03-11 DIAGNOSIS — C50919 Malignant neoplasm of unspecified site of unspecified female breast: Secondary | ICD-10-CM

## 2018-03-12 LAB — CANCER ANTIGEN 27.29: CA 27.29: 37 U/mL (ref 0.0–38.6)

## 2018-05-24 ENCOUNTER — Emergency Department: Payer: Medicare Other

## 2018-05-24 ENCOUNTER — Emergency Department
Admission: EM | Admit: 2018-05-24 | Discharge: 2018-05-24 | Disposition: A | Discharge status: Home / Self Care | Payer: Medicare Other | Attending: Emergency Medicine | Admitting: Emergency Medicine

## 2018-05-24 ENCOUNTER — Encounter: Payer: Self-pay | Admitting: Emergency Medicine

## 2018-05-24 ENCOUNTER — Other Ambulatory Visit: Payer: Self-pay

## 2018-05-24 DIAGNOSIS — I110 Hypertensive heart disease with heart failure: Secondary | ICD-10-CM | POA: Diagnosis not present

## 2018-05-24 DIAGNOSIS — S0990XA Unspecified injury of head, initial encounter: Secondary | ICD-10-CM

## 2018-05-24 DIAGNOSIS — E119 Type 2 diabetes mellitus without complications: Secondary | ICD-10-CM | POA: Diagnosis not present

## 2018-05-24 DIAGNOSIS — Y999 Unspecified external cause status: Secondary | ICD-10-CM | POA: Insufficient documentation

## 2018-05-24 DIAGNOSIS — S0003XA Contusion of scalp, initial encounter: Secondary | ICD-10-CM

## 2018-05-24 DIAGNOSIS — W19XXXA Unspecified fall, initial encounter: Secondary | ICD-10-CM

## 2018-05-24 DIAGNOSIS — Y929 Unspecified place or not applicable: Secondary | ICD-10-CM | POA: Insufficient documentation

## 2018-05-24 DIAGNOSIS — I2510 Atherosclerotic heart disease of native coronary artery without angina pectoris: Secondary | ICD-10-CM | POA: Insufficient documentation

## 2018-05-24 DIAGNOSIS — Y939 Activity, unspecified: Secondary | ICD-10-CM | POA: Insufficient documentation

## 2018-05-24 DIAGNOSIS — Z79899 Other long term (current) drug therapy: Secondary | ICD-10-CM | POA: Diagnosis not present

## 2018-05-24 DIAGNOSIS — W08XXXA Fall from other furniture, initial encounter: Secondary | ICD-10-CM | POA: Diagnosis not present

## 2018-05-24 DIAGNOSIS — Z7901 Long term (current) use of anticoagulants: Secondary | ICD-10-CM | POA: Insufficient documentation

## 2018-05-24 DIAGNOSIS — I5042 Chronic combined systolic (congestive) and diastolic (congestive) heart failure: Secondary | ICD-10-CM | POA: Insufficient documentation

## 2018-05-24 DIAGNOSIS — W228XXA Striking against or struck by other objects, initial encounter: Secondary | ICD-10-CM | POA: Insufficient documentation

## 2018-05-24 NOTE — ED Triage Notes (Signed)
approx 30 min ago was sitting on stool putting her sock on and fell backwards hitting head on walker. Denies LOC. Here because takes Eloquis.

## 2018-05-24 NOTE — ED Provider Notes (Signed)
[facility] Emergency Department Provider Note  ____________________________________________  Time seen: Approximately 2:07 PM  I have reviewed the triage vital signs and the nursing notes.   HISTORY  Chief Complaint Fall   HPI Rachael Horne is a 84 y.o. female with history as listed below including atrial fibrillation on Eliquis who presents for evaluation of head trauma.  Patient reports that she was sitting on a stool putting her sock when the stool tipped over and she fell backwards.  She hit the side of her head onto her walker.  No LOC.  She is complaining of mild constant pain located in the right side of her head which has been present since the fall.  The fall was strictly mechanical in nature.  She also reports a mild bruise on the right arm but no neck pain, no back pain, no lower extremity pain, no chest pain or abdominal pain.  Patient came in with her daughter because she is on Eliquis.  Past Medical History:  Diagnosis Date  . Anxiety   . Arthritis   . Atrial fibrillation ([place])   . Breast cancer [facility]) 2013   right breast, radiation  . Breast cancer ([place]) 2007   left breast, radiation  . Cancer ([place])    BL breast  . CHF (congestive heart failure) ([place])   . Coronary artery disease   . Diabetes mellitus without complication ([place])   . DVT (deep venous thrombosis) ([place]) 2003  . Dyspnea    with exertion  . Dysrhythmia   . GERD (gastroesophageal reflux disease)   . Hyperlipemia   . Hypertension   . Stroke [facility]) 2007   TIA  . Traumatic hematoma of left lower leg 07/2017    Patient Active Problem List   Diagnosis Date Noted  . Hematoma of leg 08/02/2017  . SOB (shortness of breath) on exertion 06/07/2017  . Menopausal osteoporosis 09/10/2016  . Chronic hip pain, left 08/08/2016  . Chronic midline low back pain without sciatica 08/08/2016  . Acute on chronic combined systolic and diastolic CHF (congestive heart failure) ([place])  12/28/2015  . Sepsis ([place]) 11/03/2015  . Acute bronchitis 11/03/2015  . Fever 11/03/2015  . Atrial fibrillation with RVR ([place]) 11/03/2015  . Atrial fibrillation ([place]) 08/22/2015  . Primary cancer of upper outer quadrant of right female breast ([place]) 08/22/2015  . Diabetes mellitus type 2, uncomplicated ([place]) 16/09/9603  . Environmental allergies 08/22/2015  . Hyperlipidemia, unspecified 08/22/2015  . Hypertension 08/22/2015  . MR (mitral regurgitation) 08/22/2015  . Arthritis of hand, degenerative 08/22/2015  . TR (tricuspid regurgitation) 08/22/2015  . Bradycardia 08/23/2014  . CAD (coronary artery disease) 08/23/2014  . History of asthma 08/23/2014  . H/O deep venous thrombosis 08/23/2014  . History of tear of meniscus of knee joint 08/23/2014  . Osteoarthritis of both hands 08/23/2014  . Obesity, unspecified 08/23/2014  . TIA (transient ischemic attack) 08/23/2014  . Palpitations 08/23/2014  . Anemia, unspecified 05/06/2014  . Breast cancer ([place]) 12/03/2005  . History of CVA (cerebrovascular accident) 07/03/1997    Past Surgical History:  Procedure Laterality Date  . APPENDECTOMY    . APPLICATION OF WOUND VAC Left 08/02/2017   Procedure: APPLICATION OF WOUND VAC;  Surgeon: Leim Fabry, MD;  Location: [facility] ORS;  Service: Orthopedics;  Laterality: Left;  . BILATERAL CARPAL TUNNEL RELEASE Bilateral   . BREAST LUMPECTOMY     removal of CA  . CARDIAC CATHETERIZATION    . CHOLECYSTECTOMY    .  EYE SURGERY Bilateral    Cataract Extraction with IOL  . HERNIA REPAIR     Umbilical Hernia Repair  . I&D EXTREMITY Left 08/02/2017   Procedure: I & D LEFT LEG HEMATOMA;  Surgeon: Leim Fabry, MD;  Location: [facility] ORS;  Service: Orthopedics;  Laterality: Left;  . TONSILLECTOMY      Prior to Admission medications   Medication Sig Start Date End Date Taking? Authorizing Provider  acetaminophen (TYLENOL) 500 MG tablet Take 1,000 mg by mouth 2 (two) times daily as needed for mild pain.     [provider]  albuterol (PROVENTIL HFA;VENTOLIN HFA) 108 (90 BASE) MCG/ACT inhaler Inhale 2 puffs into the lungs every 6 (six) hours as needed for wheezing or shortness of breath.    [provider]  alendronate (FOSAMAX) 70 MG tablet Take 1 tablet (70 mg total) by mouth once a week. Take with a full glass of water on an empty stomach. 06/13/17   Lloyd Huger, MD  ALPRAZolam Duanne Moron) 0.25 MG tablet Take 0.5 tablets (0.125 mg total) by mouth at bedtime as needed for anxiety or sleep. 11/07/15   Aldean Jewett, MD  anastrozole (ARIMIDEX) 1 MG tablet TAKE ONE (1) TABLET BY MOUTH EVERY DAY 04/10/17   Lloyd Huger, MD  apixaban (ELIQUIS) 5 MG TABS tablet Take 5 mg by mouth 2 (two) times daily.    [provider]  budesonide-formoterol (SYMBICORT) 160-4.5 MCG/ACT inhaler Inhale 2 puffs into the lungs daily.     [provider]  CALCIUM-VITAMIN D-VITAMIN K PO Take 1 tablet by mouth 2 (two) times daily.    [provider]  clotrimazole-betamethasone (LOTRISONE) cream Apply topically. 09/09/17 09/09/18  [provider]  cyclobenzaprine (FLEXERIL) 10 MG tablet Take 0.5 tablets (5 mg total) by mouth at bedtime. 01/20/17   Merlyn Lot, MD  desloratadine (CLARINEX) 5 MG tablet Take by mouth. 02/17/18 02/17/19  [provider]  diltiazem (DILACOR XR) 240 MG 24 hr capsule Take 240 mg by mouth daily.    [provider]  diphenhydramine-acetaminophen (TYLENOL PM) 25-500 MG TABS Take 1 tablet by mouth at bedtime as needed (for sleep).    [provider]  DULoxetine (CYMBALTA) 30 MG capsule  12/18/17   [provider]  fluticasone (FLONASE) 50 MCG/ACT nasal spray Place 2 sprays into both nostrils as needed.     [provider]  furosemide (LASIX) 40 MG tablet Take 40 mg by mouth 2 (two) times daily.     [provider]  gabapentin (NEURONTIN) 100 MG capsule  02/05/18   [provider]    glimepiride (AMARYL) 4 MG tablet Take 4 mg by mouth daily.    [provider]  GLUCOSAMINE-CHONDROITIN PO Take 1 tablet by mouth daily.    [provider]  glucose blood test strip TEST BLOOD SUGAR DAILY AS DIRECTED 03/05/17   [provider]  loratadine (CLARITIN) 10 MG tablet Take 10 mg by mouth daily as needed for allergies.    [provider]  losartan (COZAAR) 50 MG tablet Take 25 mg by mouth daily.     [provider]  lovastatin (MEVACOR) 40 MG tablet Take 40 mg by mouth at bedtime.    [provider]  magnesium oxide (MAG-OX) 400 MG tablet Take 400 mg by mouth daily at 12 noon.    [provider]  metolazone (ZAROXOLYN) 2.5 MG tablet Take 2.5 mg by mouth 3 (three) times a week. Mon. Wed. Fri  [provider]  montelukast (SINGULAIR) 10 MG tablet Take 10 mg by mouth at bedtime.    [provider]  Multiple Vitamin (MULTIVITAMIN WITH MINERALS) TABS tablet Take 1 tablet by mouth daily at 12 noon.    [provider]  pantoprazole (PROTONIX) 40 MG tablet Take 40 mg by mouth as needed.     [provider]  potassium chloride (K-DUR) 10 MEQ tablet Take 10 mEq by mouth daily at 12 noon.    [provider]  potassium chloride SA (KLOR-CON M20) 20 MEQ tablet Take 1 tablet (20 mEq total) by mouth daily. 09/01/17   Hinda Kehr, MD  senna-docusate (SENOKOT-S) 8.6-50 MG tablet Take 1 tablet by mouth 2 (two) times daily as needed for mild constipation or moderate constipation. 11/07/15   Aldean Jewett, MD  sodium chloride (OCEAN) 0.65 % SOLN nasal spray Place 1 spray into both nostrils as needed for congestion. 11/07/15   Aldean Jewett, MD  traMADol Veatrice Bourbon) 50 MG tablet Take 1-2 tablets by mouth every 6 hours as needed for moderate to severe pain 09/01/17   Hinda Kehr, MD  triamcinolone cream (KENALOG) 0.1 % Apply topically. 02/12/18 02/12/19  [provider]     Allergies Amoxicillin  No family history on file.  Social History Social History   Tobacco Use  . Smoking status: Never Smoker  . Smokeless tobacco: Never Used  Substance Use Topics  . Alcohol use: No  . Drug use: No    Review of Systems Constitutional: Negative for fever. Eyes: Negative for visual changes. ENT: Negative for facial injury or neck injury Cardiovascular: Negative for chest injury. Respiratory: Negative for shortness of breath. Negative for chest wall injury. Gastrointestinal: Negative for abdominal pain or injury. Genitourinary: Negative for dysuria. Musculoskeletal: Negative for back injury, negative for arm or leg pain. Skin: Negative for laceration/abrasions. + R arm bruise Neurological: + head injury.   ____________________________________________   PHYSICAL EXAM:  VITAL SIGNS: ED Triage Vitals  Enc Vitals Group     BP 05/24/18 1114 (!) 183/58     Pulse Rate 05/24/18 1114 76     Resp 05/24/18 1114 18     Temp 05/24/18 1114 98.6 F (37 C)     Temp Source 05/24/18 1114 Oral     SpO2 05/24/18 1114 96 %     Weight 05/24/18 1116 142 lb (64.4 kg)     Height 05/24/18 1116 4\' 11"  (1.499 m)     Head Circumference --      Peak Flow --      Pain Score --      Pain Loc --      Pain Edu? --      Excl. in [place]? --     Constitutional: Alert and oriented. No acute distress. Does not appear intoxicated. HEENT Head: Normocephalic, small R temporal scalp hematoma Face: No facial bony tenderness. Stable midface Ears: No hemotympanum bilaterally. No Battle sign Eyes: No eye injury. PERRL. No raccoon eyes Nose: Nontender. No epistaxis. No rhinorrhea Mouth/Throat: Mucous membranes are moist. No oropharyngeal blood. No dental injury. Airway patent without stridor. Normal voice. Neck: no C-collar in place. No midline c-spine tenderness.  Cardiovascular: Normal rate, regular rhythm. Normal and symmetric distal pulses are present in all  extremities. Pulmonary/Chest: Chest wall is stable and nontender to palpation/compression. Normal respiratory effort. Breath sounds are normal. No crepitus.  Abdominal: Soft, nontender, non distended. Musculoskeletal: Nontender with normal full range of motion in all extremities. No  deformities. No thoracic or lumbar midline spinal tenderness. Pelvis is stable. Skin: Skin is warm, dry and intact. No abrasions or contutions. Bruise on the L proximal arm Psychiatric: Speech and behavior are appropriate. Neurological: Normal speech and language. Moves all extremities to command. No gross focal neurologic deficits are appreciated.  Glascow Coma Score: 4 - Opens eyes on own 6 - Follows simple motor commands 5 - Alert and oriented GCS: 15  ____________________________________________   LABS (all labs ordered are listed, but only abnormal results are displayed)  Labs Reviewed - No data to display ____________________________________________  EKG  none  ____________________________________________  RADIOLOGY  I have personally reviewed the images performed during this visit and I agree with the Radiologist's read.   Interpretation by Radiologist:  Ct Head Wo Contrast  Result Date: 05/24/2018 CLINICAL DATA:  84 year old female with history of trauma after falling off of a stool backwards with injury to the back of the head. On Eliquis. EXAM: CT HEAD WITHOUT CONTRAST TECHNIQUE: Contiguous axial images were obtained from the base of the skull through the vertex without intravenous contrast. COMPARISON:  Head CT 01/20/2017. FINDINGS: Brain: Patchy and confluent areas of decreased attenuation are noted throughout the deep and periventricular white matter of the cerebral hemispheres bilaterally (right greater than left), compatible with chronic microvascular ischemic disease. No evidence of acute infarction, hemorrhage, hydrocephalus, extra-axial collection or mass lesion/mass effect. Vascular:  No hyperdense vessel or unexpected calcification. Skull: Normal. Negative for fracture or focal lesion. Sinuses/Orbits: No acute finding. Other: None. IMPRESSION: 1. No signs of significant acute traumatic injury to the skull or brain. 2. Chronic asymmetric (right greater than left) microvascular ischemic changes in the cerebral hemispheres bilaterally, most severe throughout the right MCA distribution. Electronically Signed   By: Vinnie Langton M.D.   On: 05/24/2018 13:32    ____________________________________________   PROCEDURES  Procedure(s) performed: None Procedures Critical Care performed:  None ____________________________________________   INITIAL IMPRESSION / ASSESSMENT AND PLAN / ED COURSE  84 y.o. female with history as listed below including atrial fibrillation on Eliquis who presents for evaluation of head trauma status post mechanical fall.  Patient is GCS of 15, alert and oriented x3, has a very small scalp hematoma and a bruise in the right arm.  No other findings on exam or history.  No signs or symptoms of basilar skull fracture.  CT head negative for intracranial bleed.  Offered an x-ray of patient's humerus but patient has declined.  She has no significant tenderness and has full painless range of motion of her right arm.  Discussed close follow-up with primary care doctor.  Discussed signs and symptoms of delayed bleed with patient and her daughter.  At this time she is going to be discharged home to the care of her daughter.      As part of my medical decision making, I reviewed the following data within the [place] History obtained from family, Nursing notes reviewed and incorporated, Radiograph reviewed , Notes from prior ED visits and [place] Controlled Substance Database    Pertinent labs & imaging results that were available during my care of the patient were reviewed by me and considered in my medical decision making (see chart for  details).    ____________________________________________   FINAL CLINICAL IMPRESSION(S) / ED DIAGNOSES  Final diagnoses:  Fall, initial encounter  Minor head injury, initial encounter  Hematoma of scalp, initial encounter      NEW MEDICATIONS STARTED DURING THIS VISIT:  ED  Discharge Orders    None       Note:  This document was prepared using Dragon voice recognition software and may include unintentional dictation errors.    Alfred Levins, [place], MD 05/24/18 1410

## 2018-05-24 NOTE — Discharge Instructions (Signed)

## 2018-05-26 ENCOUNTER — Ambulatory Visit
Admission: RE | Admit: 2018-05-26 | Discharge: 2018-05-26 | Disposition: A | Discharge status: Home / Self Care | Payer: Medicare Other | Source: Ambulatory Visit | Attending: Oncology | Admitting: Oncology

## 2018-05-26 DIAGNOSIS — I4891 Unspecified atrial fibrillation: Secondary | ICD-10-CM | POA: Insufficient documentation

## 2018-05-26 DIAGNOSIS — Z7983 Long term (current) use of bisphosphonates: Secondary | ICD-10-CM | POA: Insufficient documentation

## 2018-05-26 DIAGNOSIS — Z780 Asymptomatic menopausal state: Secondary | ICD-10-CM | POA: Diagnosis not present

## 2018-05-26 DIAGNOSIS — C50411 Malignant neoplasm of upper-outer quadrant of right female breast: Secondary | ICD-10-CM

## 2018-05-26 DIAGNOSIS — Z853 Personal history of malignant neoplasm of breast: Secondary | ICD-10-CM | POA: Diagnosis not present

## 2018-05-26 DIAGNOSIS — Z79811 Long term (current) use of aromatase inhibitors: Secondary | ICD-10-CM | POA: Insufficient documentation

## 2018-05-26 DIAGNOSIS — Z79899 Other long term (current) drug therapy: Secondary | ICD-10-CM | POA: Diagnosis not present

## 2018-05-26 DIAGNOSIS — M8588 Other specified disorders of bone density and structure, other site: Secondary | ICD-10-CM | POA: Diagnosis not present

## 2018-05-26 DIAGNOSIS — E119 Type 2 diabetes mellitus without complications: Secondary | ICD-10-CM | POA: Diagnosis not present

## 2018-05-26 DIAGNOSIS — M810 Age-related osteoporosis without current pathological fracture: Secondary | ICD-10-CM | POA: Insufficient documentation

## 2018-06-06 ENCOUNTER — Ambulatory Visit: Admission: RE | Admit: 2018-06-06 | Payer: Medicare Other | Source: Ambulatory Visit

## 2018-06-06 ENCOUNTER — Ambulatory Visit
Admission: RE | Admit: 2018-06-06 | Discharge: 2018-06-06 | Disposition: A | Discharge status: Home / Self Care | Payer: Medicare Other | Source: Ambulatory Visit | Attending: Oncology | Admitting: Oncology

## 2018-06-06 DIAGNOSIS — C50411 Malignant neoplasm of upper-outer quadrant of right female breast: Secondary | ICD-10-CM | POA: Insufficient documentation

## 2018-06-06 HISTORY — DX: Personal history of irradiation: Z92.3

## 2018-07-24 ENCOUNTER — Other Ambulatory Visit: Payer: Self-pay | Admitting: Oncology

## 2019-03-02 ENCOUNTER — Emergency Department
Admission: EM | Admit: 2019-03-02 | Discharge: 2019-03-02 | Disposition: A | Discharge status: Other Acute Inpatient Hospital | Payer: Medicare Other | Attending: Emergency Medicine | Admitting: Emergency Medicine

## 2019-03-02 ENCOUNTER — Emergency Department: Payer: Medicare Other

## 2019-03-02 ENCOUNTER — Other Ambulatory Visit: Payer: Self-pay

## 2019-03-02 ENCOUNTER — Encounter: Payer: Self-pay | Admitting: Emergency Medicine

## 2019-03-02 DIAGNOSIS — H5711 Ocular pain, right eye: Secondary | ICD-10-CM

## 2019-03-02 DIAGNOSIS — R9082 White matter disease, unspecified: Secondary | ICD-10-CM | POA: Insufficient documentation

## 2019-03-02 DIAGNOSIS — Z853 Personal history of malignant neoplasm of breast: Secondary | ICD-10-CM | POA: Diagnosis not present

## 2019-03-02 DIAGNOSIS — H0469 Other changes of lacrimal passages: Secondary | ICD-10-CM | POA: Insufficient documentation

## 2019-03-02 DIAGNOSIS — Z79899 Other long term (current) drug therapy: Secondary | ICD-10-CM | POA: Insufficient documentation

## 2019-03-02 DIAGNOSIS — H5789 Other specified disorders of eye and adnexa: Secondary | ICD-10-CM | POA: Insufficient documentation

## 2019-03-02 DIAGNOSIS — Z923 Personal history of irradiation: Secondary | ICD-10-CM | POA: Diagnosis not present

## 2019-03-02 DIAGNOSIS — Z7984 Long term (current) use of oral hypoglycemic drugs: Secondary | ICD-10-CM | POA: Insufficient documentation

## 2019-03-02 DIAGNOSIS — I110 Hypertensive heart disease with heart failure: Secondary | ICD-10-CM | POA: Diagnosis not present

## 2019-03-02 DIAGNOSIS — E119 Type 2 diabetes mellitus without complications: Secondary | ICD-10-CM | POA: Insufficient documentation

## 2019-03-02 DIAGNOSIS — I509 Heart failure, unspecified: Secondary | ICD-10-CM | POA: Insufficient documentation

## 2019-03-02 DIAGNOSIS — Z8673 Personal history of transient ischemic attack (TIA), and cerebral infarction without residual deficits: Secondary | ICD-10-CM | POA: Diagnosis not present

## 2019-03-02 DIAGNOSIS — Z7901 Long term (current) use of anticoagulants: Secondary | ICD-10-CM | POA: Diagnosis not present

## 2019-03-02 DIAGNOSIS — I2510 Atherosclerotic heart disease of native coronary artery without angina pectoris: Secondary | ICD-10-CM | POA: Diagnosis not present

## 2019-03-02 LAB — COMPREHENSIVE METABOLIC PANEL
ALBUMIN: 3.9 g/dL (ref 3.5–5.0)
ALT: 17 U/L (ref 0–44)
AST: 21 U/L (ref 15–41)
Alkaline Phosphatase: 56 U/L (ref 38–126)
Anion gap: 9 (ref 5–15)
BILIRUBIN TOTAL: 1 mg/dL (ref 0.3–1.2)
BUN: 14 mg/dL (ref 8–23)
CO2: 33 mmol/L — ABNORMAL HIGH (ref 22–32)
CREATININE: 0.85 mg/dL (ref 0.44–1.00)
Calcium: 9.5 mg/dL (ref 8.9–10.3)
Chloride: 94 mmol/L — ABNORMAL LOW (ref 98–111)
GFR calc Af Amer: >60 mL/min (ref >60)
GLUCOSE: 146 mg/dL — AB (ref 70–99)
Potassium: 3.1 mmol/L — ABNORMAL LOW (ref 3.5–5.1)
Sodium: 136 mmol/L (ref 135–145)
TOTAL PROTEIN: 7.8 g/dL (ref 6.5–8.1)

## 2019-03-02 MED ORDER — SODIUM CHLORIDE 0.9 % IV SOLN
1.0000 g | Freq: Once | INTRAVENOUS | Status: AC
  Administered 2019-03-02: 1 g via INTRAVENOUS
  Filled 2019-03-02: qty 10

## 2019-03-02 MED ORDER — ACETAMINOPHEN 500 MG PO TABS
ORAL_TABLET | ORAL | Status: AC
  Filled 2019-03-02: qty 2

## 2019-03-02 MED ORDER — IOHEXOL 300 MG/ML  SOLN
75.0000 mL | Freq: Once | INTRAMUSCULAR | Status: AC | PRN
  Administered 2019-03-02: 75 mL via INTRAVENOUS

## 2019-03-02 MED ORDER — VANCOMYCIN HCL IN DEXTROSE 1-5 GM/200ML-% IV SOLN
1000.0000 mg | Freq: Once | INTRAVENOUS | Status: AC
  Administered 2019-03-02: 1000 mg via INTRAVENOUS
  Filled 2019-03-02: qty 200

## 2019-03-02 MED ORDER — ONDANSETRON HCL 4 MG/2ML IJ SOLN
4.0000 mg | Freq: Once | INTRAMUSCULAR | Status: AC
  Administered 2019-03-02: 4 mg via INTRAVENOUS
  Filled 2019-03-02: qty 2

## 2019-03-02 MED ORDER — ACETAMINOPHEN 500 MG PO TABS
1000.0000 mg | ORAL_TABLET | Freq: Once | ORAL | Status: AC
  Administered 2019-03-02: 1000 mg via ORAL

## 2019-03-02 NOTE — ED Triage Notes (Signed)
Sent in from [facility]  States she has had pain and swelling to right eye for about 2-3 day  Worse yesterday

## 2019-03-02 NOTE — ED Provider Notes (Signed)
[facility] Emergency Department Provider Note  ____________________________________________   I have reviewed the triage vital signs and the nursing notes.   HISTORY  Chief Complaint Eye Pain   History limited by: Not Limited   HPI Rachael Horne is a 85 y.o. female who presents to the emergency department today from ophthalmologist because of concern for possible orbital cellulitis and doctor's desire for patient to undergo CT scan. The patient states that she has noticed some redness and swelling to her right eye for the past few days. It has been painful. She states that she has had decreased vision out of that eye and it is somewhat painful for her to move her eye. She had similar symptoms once in the past and was treated with antibiotics. The patient went to eye doctor's today and their exam was concerning for orbital cellulitis. Sent to the emergency department for a CT scan. Patient denies any fevers.   Records reviewed. Per medical record review patient has a history of seeing primary care doctor for right eye pain almost one year ago, diagnosed with tear duct infection and started on doxycyline.   Past Medical History:  Diagnosis Date  . Anxiety   . Arthritis   . Atrial fibrillation ([place])   . Breast cancer [facility]) 2013   right breast, radiation  . Breast cancer ([place]) 2007   left breast, radiation  . Cancer ([place])    BL breast  . CHF (congestive heart failure) ([place])   . Coronary artery disease   . Diabetes mellitus without complication ([place])   . DVT (deep venous thrombosis) ([place]) 2003  . Dyspnea    with exertion  . Dysrhythmia   . GERD (gastroesophageal reflux disease)   . Hyperlipemia   . Hypertension   . Personal history of radiation therapy 2013   RIGHT lumpectomy  . Stroke [facility]) 2007   TIA  . Traumatic hematoma of left lower leg 07/2017    Patient Active Problem List   Diagnosis Date Noted  . Hematoma of leg 08/02/2017  . SOB  (shortness of breath) on exertion 06/07/2017  . Menopausal osteoporosis 09/10/2016  . Chronic hip pain, left 08/08/2016  . Chronic midline low back pain without sciatica 08/08/2016  . Acute on chronic combined systolic and diastolic CHF (congestive heart failure) ([place]) 12/28/2015  . Sepsis ([place]) 11/03/2015  . Acute bronchitis 11/03/2015  . Fever 11/03/2015  . Atrial fibrillation with RVR ([place]) 11/03/2015  . Atrial fibrillation ([place]) 08/22/2015  . Primary cancer of upper outer quadrant of right female breast ([place]) 08/22/2015  . Diabetes mellitus type 2, uncomplicated ([place]) 76/73/4193  . Environmental allergies 08/22/2015  . Hyperlipidemia, unspecified 08/22/2015  . Hypertension 08/22/2015  . MR (mitral regurgitation) 08/22/2015  . Arthritis of hand, degenerative 08/22/2015  . TR (tricuspid regurgitation) 08/22/2015  . Bradycardia 08/23/2014  . CAD (coronary artery disease) 08/23/2014  . History of asthma 08/23/2014  . H/O deep venous thrombosis 08/23/2014  . History of tear of meniscus of knee joint 08/23/2014  . Osteoarthritis of both hands 08/23/2014  . Obesity, unspecified 08/23/2014  . TIA (transient ischemic attack) 08/23/2014  . Palpitations 08/23/2014  . Anemia, unspecified 05/06/2014  . Breast cancer ([place]) 12/03/2005  . History of CVA (cerebrovascular accident) 07/03/1997    Past Surgical History:  Procedure Laterality Date  . APPENDECTOMY    . APPLICATION OF WOUND VAC Left 08/02/2017   Procedure: APPLICATION OF WOUND VAC;  Surgeon: Leim Fabry, MD;  Location: [facility] ORS;  Service: Orthopedics;  Laterality: Left;  . BILATERAL CARPAL TUNNEL RELEASE Bilateral   . BREAST LUMPECTOMY Right 2013   RIGHT lumpectomy w/ radiation 2013  . BREAST LUMPECTOMY Left 2009   LEFT LUMPECTOMY W/ RADIATION 2009  . CARDIAC CATHETERIZATION    . CHOLECYSTECTOMY    . EYE SURGERY Bilateral    Cataract Extraction with IOL  . HERNIA REPAIR     Umbilical Hernia Repair  . I&D EXTREMITY Left  08/02/2017   Procedure: I & D LEFT LEG HEMATOMA;  Surgeon: Leim Fabry, MD;  Location: [facility] ORS;  Service: Orthopedics;  Laterality: Left;  . TONSILLECTOMY      Prior to Admission medications   Medication Sig Start Date End Date Taking? Authorizing Provider  acetaminophen (TYLENOL) 500 MG tablet Take 1,000 mg by mouth 2 (two) times daily as needed for mild pain.    [provider]  albuterol (PROVENTIL HFA;VENTOLIN HFA) 108 (90 BASE) MCG/ACT inhaler Inhale 2 puffs into the lungs every 6 (six) hours as needed for wheezing or shortness of breath.    [provider]  alendronate (FOSAMAX) 70 MG tablet TAKE ONE TABLET BY MOUTH EACH WEEK, ON AN EMPTY STOMACH BEFORE BREAKFAST WITH 8oz OF WATER AND REMAIN UPRIGHT FOR :30 07/24/18   Lloyd Huger, MD  ALPRAZolam Duanne Moron) 0.25 MG tablet Take 0.5 tablets (0.125 mg total) by mouth at bedtime as needed for anxiety or sleep. 11/07/15   Aldean Jewett, MD  anastrozole (ARIMIDEX) 1 MG tablet TAKE ONE (1) TABLET BY MOUTH EVERY DAY 04/10/17   Lloyd Huger, MD  apixaban (ELIQUIS) 5 MG TABS tablet Take 5 mg by mouth 2 (two) times daily.    [provider]  budesonide-formoterol (SYMBICORT) 160-4.5 MCG/ACT inhaler Inhale 2 puffs into the lungs daily.     [provider]  CALCIUM-VITAMIN D-VITAMIN K PO Take 1 tablet by mouth 2 (two) times daily.    [provider]  cyclobenzaprine (FLEXERIL) 10 MG tablet Take 0.5 tablets (5 mg total) by mouth at bedtime. 01/20/17   Merlyn Lot, MD  desloratadine (CLARINEX) 5 MG tablet Take by mouth. 02/17/18 02/17/19  [provider]  diltiazem (DILACOR XR) 240 MG 24 hr capsule Take 240 mg by mouth daily.    [provider]  diphenhydramine-acetaminophen (TYLENOL PM) 25-500 MG TABS Take 1 tablet by mouth at bedtime as needed (for sleep).    [provider]  DULoxetine (CYMBALTA) 30 MG capsule  12/18/17   [provider]  fluticasone  (FLONASE) 50 MCG/ACT nasal spray Place 2 sprays into both nostrils as needed.     [provider]  furosemide (LASIX) 40 MG tablet Take 40 mg by mouth 2 (two) times daily.     [provider]  gabapentin (NEURONTIN) 100 MG capsule  02/05/18   [provider]  glimepiride (AMARYL) 4 MG tablet Take 4 mg by mouth daily.    [provider]  GLUCOSAMINE-CHONDROITIN PO Take 1 tablet by mouth daily.    [provider]  glucose blood test strip TEST BLOOD SUGAR DAILY AS DIRECTED 03/05/17   [provider]  loratadine (CLARITIN) 10 MG tablet Take 10 mg by mouth daily as needed for allergies.    [provider]  losartan (COZAAR) 50 MG tablet Take 25 mg by mouth daily.     [provider]  lovastatin (MEVACOR) 40 MG tablet Take 40 mg by mouth at bedtime.    [provider]  magnesium oxide (  MAG-OX) 400 MG tablet Take 400 mg by mouth daily at 12 noon.    [provider]  metolazone (ZAROXOLYN) 2.5 MG tablet Take 2.5 mg by mouth 3 (three) times a week. Mon. Wed. Fri    [provider]  montelukast (SINGULAIR) 10 MG tablet Take 10 mg by mouth at bedtime.    [provider]  Multiple Vitamin (MULTIVITAMIN WITH MINERALS) TABS tablet Take 1 tablet by mouth daily at 12 noon.    [provider]  pantoprazole (PROTONIX) 40 MG tablet Take 40 mg by mouth as needed.     [provider]  potassium chloride (K-DUR) 10 MEQ tablet Take 10 mEq by mouth daily at 12 noon.    [provider]  potassium chloride SA (KLOR-CON M20) 20 MEQ tablet Take 1 tablet (20 mEq total) by mouth daily. 09/01/17   Hinda Kehr, MD  senna-docusate (SENOKOT-S) 8.6-50 MG tablet Take 1 tablet by mouth 2 (two) times daily as needed for mild constipation or moderate constipation. 11/07/15   Aldean Jewett, MD  sodium chloride (OCEAN) 0.65 % SOLN nasal spray Place 1 spray into both nostrils as needed for congestion.  11/07/15   Aldean Jewett, MD  traMADol Veatrice Bourbon) 50 MG tablet Take 1-2 tablets by mouth every 6 hours as needed for moderate to severe pain 09/01/17   Hinda Kehr, MD    Allergies Amoxicillin  Family History  Problem Relation Age of Onset  . Breast cancer Neg Hx     Social History Social History   Tobacco Use  . Smoking status: Never Smoker  . Smokeless tobacco: Never Used  Substance Use Topics  . Alcohol use: No  . Drug use: No    Review of Systems Constitutional: No fever/chills Eyes: Positive for right eye swelling and pain.  ENT: No sore throat. Cardiovascular: Denies chest pain. Respiratory: Denies shortness of breath. Gastrointestinal: No abdominal pain.  No nausea, no vomiting.  No diarrhea.   Genitourinary: Negative for dysuria. Musculoskeletal: Negative for back pain. Skin: Negative for rash. Neurological: Negative for headaches, focal weakness or numbness.  ____________________________________________   PHYSICAL EXAM:  VITAL SIGNS: ED Triage Vitals  Enc Vitals Group     BP 03/02/19 1241 (!) 176/61     Pulse Rate 03/02/19 1241 88     Resp 03/02/19 1241 18     Temp 03/02/19 1241 98.1 F (36.7 C)     Temp Source 03/02/19 1241 Oral     SpO2 03/02/19 1241 98 %     Weight 03/02/19 1240 135 lb (61.2 kg)     Height 03/02/19 1240 4\' 11"  (1.499 m)     Head Circumference --      Peak Flow --      Pain Score 03/02/19 1239 5   Constitutional: Alert and oriented.  Eyes: Periorbital swelling to right eye. EOMI.  ENT      Head: Normocephalic and atraumatic.      Nose: No congestion/rhinnorhea.      Mouth/Throat: Mucous membranes are moist.      Neck: No stridor. Hematological/Lymphatic/Immunilogical: No cervical lymphadenopathy. Cardiovascular: Normal rate, regular rhythm.  No murmurs, rubs, or gallops.  Respiratory: Normal respiratory effort without tachypnea nor retractions. Breath sounds are clear and equal bilaterally. No  wheezes/rales/rhonchi. Gastrointestinal: Soft and non tender. No rebound. No guarding.  Genitourinary: Deferred Musculoskeletal: Normal range of motion in all extremities. No lower extremity edema. Neurologic:  Normal speech and language. No gross focal neurologic deficits are appreciated.  Skin:  Skin is warm, dry and intact. No rash noted. Psychiatric: Mood and affect are normal. Speech and behavior are normal. Patient exhibits appropriate insight and judgment.  ____________________________________________    LABS (pertinent positives/negatives)  CMP na 136, k 3.1, glu 146, cr 0.85  ____________________________________________   EKG  None  ____________________________________________    RADIOLOGY  CT orbit Concern for infected right sided dacryocystocele with some proptosis.   ____________________   PROCEDURES  Procedures  ____________________________________________   INITIAL IMPRESSION / ASSESSMENT AND PLAN / ED COURSE  Pertinent labs & imaging results that were available during my care of the patient were reviewed by me and considered in my medical decision making (see chart for details).   Patient presented to the emergency department because of concern for right eye swelling and pain. Sent from ophthalmology office for CT scan. CT scan of the orbits is concerning for infected dacryocystocele. Discussed with ophthalmologist who felt patient would be best served at a tertiary care center. The patient will be transferred to [facility]. Discussed plan with patient.    ____________________________________________   FINAL CLINICAL IMPRESSION(S) / ED DIAGNOSES  Final diagnoses:  Eye swelling, right  Eye pain, right  Dacryocystocele     Note: This dictation was prepared with Dragon dictation. Any transcriptional errors that result from this process are unintentional     Nance Pear, MD 03/02/19 2117

## 2019-03-02 NOTE — ED Notes (Signed)
Pts walker, purse, and yellow basin sent with pt in ambulance.

## 2019-03-02 NOTE — ED Notes (Signed)
Rachael Horne, son [phone]

## 2019-03-08 ENCOUNTER — Inpatient Hospital Stay
Admission: EM | Admit: 2019-03-08 | Discharge: 2019-03-11 | DRG: 292 | Disposition: A | Discharge status: Skilled Nursing Facility | Payer: Medicare Other | Attending: Internal Medicine | Admitting: Internal Medicine

## 2019-03-08 ENCOUNTER — Other Ambulatory Visit: Payer: Self-pay

## 2019-03-08 ENCOUNTER — Emergency Department: Payer: Medicare Other

## 2019-03-08 DIAGNOSIS — I480 Paroxysmal atrial fibrillation: Secondary | ICD-10-CM | POA: Diagnosis present

## 2019-03-08 DIAGNOSIS — N289 Disorder of kidney and ureter, unspecified: Secondary | ICD-10-CM

## 2019-03-08 DIAGNOSIS — Z79811 Long term (current) use of aromatase inhibitors: Secondary | ICD-10-CM

## 2019-03-08 DIAGNOSIS — Z8673 Personal history of transient ischemic attack (TIA), and cerebral infarction without residual deficits: Secondary | ICD-10-CM

## 2019-03-08 DIAGNOSIS — Z7984 Long term (current) use of oral hypoglycemic drugs: Secondary | ICD-10-CM

## 2019-03-08 DIAGNOSIS — E785 Hyperlipidemia, unspecified: Secondary | ICD-10-CM | POA: Diagnosis present

## 2019-03-08 DIAGNOSIS — R531 Weakness: Secondary | ICD-10-CM | POA: Diagnosis not present

## 2019-03-08 DIAGNOSIS — F329 Major depressive disorder, single episode, unspecified: Secondary | ICD-10-CM | POA: Diagnosis present

## 2019-03-08 DIAGNOSIS — I110 Hypertensive heart disease with heart failure: Principal | ICD-10-CM | POA: Diagnosis present

## 2019-03-08 DIAGNOSIS — K219 Gastro-esophageal reflux disease without esophagitis: Secondary | ICD-10-CM | POA: Diagnosis present

## 2019-03-08 DIAGNOSIS — I10 Essential (primary) hypertension: Secondary | ICD-10-CM | POA: Diagnosis present

## 2019-03-08 DIAGNOSIS — Z79899 Other long term (current) drug therapy: Secondary | ICD-10-CM

## 2019-03-08 DIAGNOSIS — H05019 Cellulitis of unspecified orbit: Secondary | ICD-10-CM | POA: Diagnosis present

## 2019-03-08 DIAGNOSIS — F419 Anxiety disorder, unspecified: Secondary | ICD-10-CM | POA: Diagnosis present

## 2019-03-08 DIAGNOSIS — N179 Acute kidney failure, unspecified: Secondary | ICD-10-CM | POA: Diagnosis present

## 2019-03-08 DIAGNOSIS — Z923 Personal history of irradiation: Secondary | ICD-10-CM

## 2019-03-08 DIAGNOSIS — T368X5A Adverse effect of other systemic antibiotics, initial encounter: Secondary | ICD-10-CM | POA: Diagnosis present

## 2019-03-08 DIAGNOSIS — Z853 Personal history of malignant neoplasm of breast: Secondary | ICD-10-CM

## 2019-03-08 DIAGNOSIS — Z7901 Long term (current) use of anticoagulants: Secondary | ICD-10-CM

## 2019-03-08 DIAGNOSIS — I2510 Atherosclerotic heart disease of native coronary artery without angina pectoris: Secondary | ICD-10-CM | POA: Diagnosis present

## 2019-03-08 DIAGNOSIS — Z7951 Long term (current) use of inhaled steroids: Secondary | ICD-10-CM

## 2019-03-08 DIAGNOSIS — E119 Type 2 diabetes mellitus without complications: Secondary | ICD-10-CM

## 2019-03-08 DIAGNOSIS — I5043 Acute on chronic combined systolic (congestive) and diastolic (congestive) heart failure: Secondary | ICD-10-CM | POA: Diagnosis present

## 2019-03-08 DIAGNOSIS — Z86718 Personal history of other venous thrombosis and embolism: Secondary | ICD-10-CM

## 2019-03-08 LAB — CBC
HCT: 33.2 % — ABNORMAL LOW (ref 36.0–46.0)
Hemoglobin: 11.2 g/dL — ABNORMAL LOW (ref 12.0–15.0)
MCH: 28.6 pg (ref 26.0–34.0)
MCHC: 33.7 g/dL (ref 30.0–36.0)
MCV: 84.9 fL (ref 80.0–100.0)
Platelets: 304 10*3/uL (ref 150–400)
RBC: 3.91 MIL/uL (ref 3.87–5.11)
RDW: 16.5 % — ABNORMAL HIGH (ref 11.5–15.5)
WBC: 7 10*3/uL (ref 4.0–10.5)
nRBC: 0 % (ref 0.0–0.2)

## 2019-03-08 NOTE — ED Triage Notes (Signed)
Reports weakness and decreased mobility, especially todoay.

## 2019-03-08 NOTE — ED Notes (Signed)
XR at bedside

## 2019-03-08 NOTE — ED Provider Notes (Signed)
[facility] Emergency Department Provider Note    First MD Initiated Contact with Patient 03/08/19 2312     (approximate)  I have reviewed the triage vital signs and the nursing notes.   HISTORY  Chief Complaint Weakness    HPI Rachael Horne is a 85 y.o. female with medical history as listed below including atrial fibrillation congestive heart failure DVT and diabetes presents to the emergency department secondary to generalized weakness which patient states began this afternoon.  Patient states that she was unable to stand from her chair secondary to weakness.  Patient states that weakness has persisted and progressed over the course of the day.  Patient denies any chest pain no shortness of breath.  Patient denies any abdominal pain.  Patient denies any nausea vomiting diarrhea constipation.  Patient denies any urinary symptoms.        Past Medical History:  Diagnosis Date  . Anxiety   . Arthritis   . Atrial fibrillation ([place])   . Breast cancer [facility]) 2013   right breast, radiation  . Breast cancer ([place]) 2007   left breast, radiation  . Cancer ([place])    BL breast  . CHF (congestive heart failure) ([place])   . Coronary artery disease   . Diabetes mellitus without complication ([place])   . DVT (deep venous thrombosis) ([place]) 2003  . Dyspnea    with exertion  . Dysrhythmia   . GERD (gastroesophageal reflux disease)   . Hyperlipemia   . Hypertension   . Personal history of radiation therapy 2013   RIGHT lumpectomy  . Stroke [facility]) 2007   TIA  . Traumatic hematoma of left lower leg 07/2017    Patient Active Problem List   Diagnosis Date Noted  . Hematoma of leg 08/02/2017  . SOB (shortness of breath) on exertion 06/07/2017  . Menopausal osteoporosis 09/10/2016  . Chronic hip pain, left 08/08/2016  . Chronic midline low back pain without sciatica 08/08/2016  . Acute on chronic combined systolic and diastolic CHF (congestive heart failure) ([place])  12/28/2015  . Sepsis ([place]) 11/03/2015  . Acute bronchitis 11/03/2015  . Fever 11/03/2015  . Atrial fibrillation with RVR ([place]) 11/03/2015  . Atrial fibrillation ([place]) 08/22/2015  . Primary cancer of upper outer quadrant of right female breast ([place]) 08/22/2015  . Diabetes mellitus type 2, uncomplicated ([place]) 82/42/3536  . Environmental allergies 08/22/2015  . HLD (hyperlipidemia) 08/22/2015  . Hypertension 08/22/2015  . MR (mitral regurgitation) 08/22/2015  . Arthritis of hand, degenerative 08/22/2015  . TR (tricuspid regurgitation) 08/22/2015  . Bradycardia 08/23/2014  . CAD (coronary artery disease) 08/23/2014  . History of asthma 08/23/2014  . H/O deep venous thrombosis 08/23/2014  . History of tear of meniscus of knee joint 08/23/2014  . Osteoarthritis of both hands 08/23/2014  . Obesity, unspecified 08/23/2014  . TIA (transient ischemic attack) 08/23/2014  . Palpitations 08/23/2014  . Anemia, unspecified 05/06/2014  . Breast cancer ([place]) 12/03/2005  . History of CVA (cerebrovascular accident) 07/03/1997    Past Surgical History:  Procedure Laterality Date  . APPENDECTOMY    . APPLICATION OF WOUND VAC Left 08/02/2017   Procedure: APPLICATION OF WOUND VAC;  Surgeon: Leim Fabry, MD;  Location: [facility] ORS;  Service: Orthopedics;  Laterality: Left;  . BILATERAL CARPAL TUNNEL RELEASE Bilateral   . BREAST LUMPECTOMY Right 2013   RIGHT lumpectomy w/ radiation 2013  . BREAST LUMPECTOMY Left 2009   LEFT LUMPECTOMY W/ RADIATION 2009  . CARDIAC CATHETERIZATION    .  CHOLECYSTECTOMY    . EYE SURGERY Bilateral    Cataract Extraction with IOL  . HERNIA REPAIR     Umbilical Hernia Repair  . I&D EXTREMITY Left 08/02/2017   Procedure: I & D LEFT LEG HEMATOMA;  Surgeon: Leim Fabry, MD;  Location: [facility] ORS;  Service: Orthopedics;  Laterality: Left;  . TONSILLECTOMY      Prior to Admission medications   Medication Sig Start Date End Date Taking? Authorizing Provider  acetaminophen  (TYLENOL) 500 MG tablet Take 1,000 mg by mouth 2 (two) times daily as needed for mild pain.    [provider]  albuterol (PROVENTIL HFA;VENTOLIN HFA) 108 (90 BASE) MCG/ACT inhaler Inhale 2 puffs into the lungs every 6 (six) hours as needed for wheezing or shortness of breath.    [provider]  alendronate (FOSAMAX) 70 MG tablet TAKE ONE TABLET BY MOUTH EACH WEEK, ON AN EMPTY STOMACH BEFORE BREAKFAST WITH 8oz OF WATER AND REMAIN UPRIGHT FOR :30 07/24/18   Lloyd Huger, MD  ALPRAZolam Duanne Moron) 0.25 MG tablet Take 0.5 tablets (0.125 mg total) by mouth at bedtime as needed for anxiety or sleep. Patient not taking: Reported on 03/02/2019 11/07/15   Aldean Jewett, MD  anastrozole (ARIMIDEX) 1 MG tablet TAKE ONE (1) TABLET BY MOUTH EVERY DAY 04/10/17   Lloyd Huger, MD  apixaban (ELIQUIS) 5 MG TABS tablet Take 5 mg by mouth 2 (two) times daily.    [provider]  budesonide-formoterol (SYMBICORT) 160-4.5 MCG/ACT inhaler Inhale 2 puffs into the lungs daily.     [provider]  CALCIUM-VITAMIN D-VITAMIN K PO Take 1 tablet by mouth 2 (two) times daily.    [provider]  cyclobenzaprine (FLEXERIL) 10 MG tablet Take 0.5 tablets (5 mg total) by mouth at bedtime. 01/20/17   Merlyn Lot, MD  desloratadine (CLARINEX) 5 MG tablet Take by mouth. 02/17/18 02/17/19  [provider]  diltiazem (DILACOR XR) 240 MG 24 hr capsule Take 240 mg by mouth daily.    [provider]  diphenhydramine-acetaminophen (TYLENOL PM) 25-500 MG TABS Take 1 tablet by mouth at bedtime as needed (for sleep).    [provider]  DULoxetine (CYMBALTA) 30 MG capsule  12/18/17   [provider]  fluticasone (FLONASE) 50 MCG/ACT nasal spray Place 2 sprays into both nostrils as needed.     [provider]  furosemide (LASIX) 40 MG tablet Take 40 mg by mouth 2 (two) times daily.     [provider]  gabapentin (NEURONTIN) 100 MG  capsule  02/05/18   [provider]  glimepiride (AMARYL) 4 MG tablet Take 4 mg by mouth daily.    [provider]  GLUCOSAMINE-CHONDROITIN PO Take 1 tablet by mouth daily.    [provider]  glucose blood test strip TEST BLOOD SUGAR DAILY AS DIRECTED 03/05/17   [provider]  loratadine (CLARITIN) 10 MG tablet Take 10 mg by mouth daily as needed for allergies.    [provider]  losartan (COZAAR) 50 MG tablet Take 25 mg by mouth daily.     [provider]  lovastatin (MEVACOR) 40 MG tablet Take 40 mg by mouth at bedtime.    [provider]  magnesium oxide (MAG-OX) 400 MG tablet Take 400 mg by mouth daily at 12 noon.    [provider]  metolazone (ZAROXOLYN) 2.5 MG tablet Take 2.5 mg by mouth 3 (three) times a week. Mon. Wed. Fri  [provider]  montelukast (SINGULAIR) 10 MG tablet Take 10 mg by mouth at bedtime.    [provider]  Multiple Vitamin (MULTIVITAMIN WITH MINERALS) TABS tablet Take 1 tablet by mouth daily at 12 noon.    [provider]  pantoprazole (PROTONIX) 40 MG tablet Take 40 mg by mouth as needed.     [provider]  potassium chloride (K-DUR) 10 MEQ tablet Take 10 mEq by mouth daily at 12 noon.    [provider]  potassium chloride SA (KLOR-CON M20) 20 MEQ tablet Take 1 tablet (20 mEq total) by mouth daily. 09/01/17   Hinda Kehr, MD  senna-docusate (SENOKOT-S) 8.6-50 MG tablet Take 1 tablet by mouth 2 (two) times daily as needed for mild constipation or moderate constipation. 11/07/15   Aldean Jewett, MD  sodium chloride (OCEAN) 0.65 % SOLN nasal spray Place 1 spray into both nostrils as needed for congestion. 11/07/15   Aldean Jewett, MD  traMADol Veatrice Bourbon) 50 MG tablet Take 1-2 tablets by mouth every 6 hours as needed for moderate to severe pain 09/01/17   Hinda Kehr, MD    Allergies Amoxicillin  Family History  Problem Relation Age of  Onset  . Breast cancer Neg Hx     Social History Social History   Tobacco Use  . Smoking status: Never Smoker  . Smokeless tobacco: Never Used  Substance Use Topics  . Alcohol use: No  . Drug use: No    Review of Systems Constitutional: No fever/chills Eyes: No visual changes. ENT: No sore throat. Cardiovascular: Denies chest pain. Respiratory: Denies shortness of breath. Gastrointestinal: No abdominal pain.  No nausea, no vomiting.  No diarrhea.  No constipation. Genitourinary: Negative for dysuria. Musculoskeletal: Negative for neck pain.  Negative for back pain. Integumentary: Negative for rash. Neurological: Negative for headaches, focal weakness or numbness.  Positive for generalized weakness.  ____________________________________________   PHYSICAL EXAM:  VITAL SIGNS: ED Triage Vitals  Enc Vitals Group     BP 03/08/19 2244 (!) 177/64     Pulse Rate 03/08/19 2244 76     Resp 03/08/19 2244 18     Temp 03/08/19 2244 98.3 F (36.8 C)     Temp Source 03/08/19 2244 Oral     SpO2 03/08/19 2244 98 %     Weight --      Height --      Head Circumference --      Peak Flow --      Pain Score 03/08/19 2243 0     Pain Loc --      Pain Edu? --      Excl. in [place]? --     Constitutional: Alert and oriented. Well appearing and in no acute distress. Eyes: Conjunctivae are normal. PERRL.  Head: Atraumatic. Nose: No congestion/rhinnorhea. Mouth/Throat: Mucous membranes are moist.  Oropharynx non-erythematous. Neck: No stridor.   Cardiovascular: Normal rate, regular rhythm. Good peripheral circulation. Grossly normal heart sounds. Respiratory: Normal respiratory effort.  No retractions. Lungs CTAB. Gastrointestinal: Soft and nontender. No distention.  Musculoskeletal: No lower extremity tenderness nor edema. No gross deformities of extremities. Neurologic:  Normal speech and language. No gross focal neurologic deficits are appreciated.  Skin:  Skin is warm, dry and  intact. No rash noted. Psychiatric: Mood and affect are normal. Speech and behavior are normal.  ____________________________________________   LABS (all labs ordered are listed, but only abnormal results are displayed)  Labs Reviewed  CBC - Abnormal; Notable for  the following components:      Result Value   Hemoglobin 11.2 (*)    HCT 33.2 (*)    RDW 16.5 (*)    All other components within normal limits  COMPREHENSIVE METABOLIC PANEL - Abnormal; Notable for the following components:   Sodium 130 (*)    Chloride 91 (*)    Glucose, Bld 114 (*)    BUN 26 (*)    Creatinine, Ser 1.46 (*)    Calcium 8.6 (*)    GFR calc non Af Amer 32 (*)    GFR calc Af Amer 37 (*)    All other components within normal limits  URINALYSIS, COMPLETE (UACMP) WITH MICROSCOPIC - Abnormal; Notable for the following components:   Color, Urine YELLOW (*)    APPearance CLEAR (*)    Bacteria, UA RARE (*)    All other components within normal limits  BRAIN NATRIURETIC PEPTIDE - Abnormal; Notable for the following components:   B Natriuretic Peptide 279.0 (*)    All other components within normal limits  TROPONIN I   ____________________________________________  EKG  ED ECG REPORT I, Mor[place] N BROWN, the attending physician, personally viewed and interpreted this ECG.   Date: 03/08/2019  EKG Time: 11:02 PM  Rate: 95  Rhythm: Atrial fibrillation  Axis: Normal  Intervals: Normal  ST&T Change: None  ____________________________________________  RADIOLOGY I, [place] N BROWN, personally viewed and evaluated these images (plain radiographs) as part of my medical decision making, as well as reviewing the written report by the radiologist.  ED MD interpretation: Stable cardiomegaly chronic interstitial findings noted on chest x-ray.  Official radiology report(s): Dg Chest Portable 1 View  Result Date: 03/08/2019 CLINICAL DATA:  Hypoxia. Weakness. EXAM: PORTABLE CHEST 1 VIEW COMPARISON:  Report from  chest radiograph 12/23/2018, images not available. Radiograph 11/05/2015 FINDINGS: Cardiomegaly is unchanged. Unchanged mediastinal contours. Interstitial/bronchial coarsening, chronic and unchanged. No acute airspace disease. No evidence of pulmonary edema, pleural effusion or pneumothorax. No acute osseous abnormalities are seen. IMPRESSION: Stable cardiomegaly and chronic interstitial coarsening. Electronically Signed   By: Keith Rake M.D.   On: 03/08/2019 23:50     Procedures   ____________________________________________   INITIAL IMPRESSION / MDM / ASSESSMENT AND PLAN / ED COURSE  As part of my medical decision making, I reviewed the following data within the [place] was evaluated in Emergency Department on 03/09/2019 for the symptoms described in the history of present illness. She was evaluated in the context of the global COVID-19 pandemic, which necessitated consideration that the patient might be at risk for infection with the SARS-CoV-2 virus that causes COVID-19. Institutional protocols and algorithms that pertain to the evaluation of patients at risk for COVID-19 are in a state of rapid change based on information released by regulatory bodies including the CDC and federal and state organizations. These policies and algorithms were followed during the patient's care in the ED.     85 year old female presenting with generalized weakness with laboratory data consistent with acute renal insufficiency with a creatinine of 1.46. Patient discussed with Dr Jannifer Franklin for hospital admission ____________________________________________  FINAL CLINICAL IMPRESSION(S) / ED DIAGNOSES  Final diagnoses:  Generalized weakness  Renal insufficiency     MEDICATIONS GIVEN DURING THIS VISIT:  Medications - No data to display   ED Discharge Orders    None       Note:  This document was prepared using Dragon voice recognition software and may  include  unintentional dictation errors.   Gregor Hams, MD 03/09/19 [phone]

## 2019-03-09 ENCOUNTER — Other Ambulatory Visit: Payer: Self-pay

## 2019-03-09 ENCOUNTER — Observation Stay
Admit: 2019-03-09 | Discharge: 2019-03-09 | Disposition: A | Discharge status: Home / Self Care | Payer: Medicare Other | Attending: Internal Medicine | Admitting: Internal Medicine

## 2019-03-09 LAB — URINALYSIS, COMPLETE (UACMP) WITH MICROSCOPIC
Bilirubin Urine: NEGATIVE
Glucose, UA: NEGATIVE mg/dL
Hgb urine dipstick: NEGATIVE
Ketones, ur: NEGATIVE mg/dL
Leukocytes,Ua: NEGATIVE
Nitrite: NEGATIVE
Protein, ur: NEGATIVE mg/dL
Specific Gravity, Urine: 1.017 (ref 1.005–1.030)
pH: 7 (ref 5.0–8.0)

## 2019-03-09 LAB — COMPREHENSIVE METABOLIC PANEL
ALT: 23 U/L (ref 0–44)
AST: 22 U/L (ref 15–41)
Albumin: 3.6 g/dL (ref 3.5–5.0)
Alkaline Phosphatase: 61 U/L (ref 38–126)
Anion gap: 12 (ref 5–15)
BUN: 26 mg/dL — ABNORMAL HIGH (ref 8–23)
CO2: 27 mmol/L (ref 22–32)
Calcium: 8.6 mg/dL — ABNORMAL LOW (ref 8.9–10.3)
Chloride: 91 mmol/L — ABNORMAL LOW (ref 98–111)
Creatinine, Ser: 1.46 mg/dL — ABNORMAL HIGH (ref 0.44–1.00)
GFR calc Af Amer: 37 mL/min — ABNORMAL LOW (ref >60)
GFR calc non Af Amer: 32 mL/min — ABNORMAL LOW (ref >60)
Glucose, Bld: 114 mg/dL — ABNORMAL HIGH (ref 70–99)
Potassium: 3.9 mmol/L (ref 3.5–5.1)
Sodium: 130 mmol/L — ABNORMAL LOW (ref 135–145)
Total Bilirubin: 0.4 mg/dL (ref 0.3–1.2)
Total Protein: 6.7 g/dL (ref 6.5–8.1)

## 2019-03-09 LAB — CBC
HCT: 33.9 % — ABNORMAL LOW (ref 36.0–46.0)
Hemoglobin: 11.2 g/dL — ABNORMAL LOW (ref 12.0–15.0)
MCH: 28.6 pg (ref 26.0–34.0)
MCHC: 33 g/dL (ref 30.0–36.0)
MCV: 86.5 fL (ref 80.0–100.0)
Platelets: 280 10*3/uL (ref 150–400)
RBC: 3.92 MIL/uL (ref 3.87–5.11)
RDW: 16.4 % — ABNORMAL HIGH (ref 11.5–15.5)
WBC: 6.2 10*3/uL (ref 4.0–10.5)
nRBC: 0 % (ref 0.0–0.2)

## 2019-03-09 LAB — BASIC METABOLIC PANEL
Anion gap: 10 (ref 5–15)
BUN: 23 mg/dL (ref 8–23)
CO2: 29 mmol/L (ref 22–32)
Calcium: 8.7 mg/dL — ABNORMAL LOW (ref 8.9–10.3)
Chloride: 92 mmol/L — ABNORMAL LOW (ref 98–111)
Creatinine, Ser: 1.24 mg/dL — ABNORMAL HIGH (ref 0.44–1.00)
GFR calc Af Amer: 45 mL/min — ABNORMAL LOW (ref >60)
GFR calc non Af Amer: 38 mL/min — ABNORMAL LOW (ref >60)
Glucose, Bld: 111 mg/dL — ABNORMAL HIGH (ref 70–99)
Potassium: 3.8 mmol/L (ref 3.5–5.1)
Sodium: 131 mmol/L — ABNORMAL LOW (ref 135–145)

## 2019-03-09 LAB — ECHOCARDIOGRAM COMPLETE
Height: 64 in
Weight: 2169.6 oz

## 2019-03-09 LAB — TROPONIN I: Troponin I: <0.03 ng/mL (ref <0.03)

## 2019-03-09 LAB — GLUCOSE, CAPILLARY
Glucose-Capillary: 117 mg/dL — ABNORMAL HIGH (ref 70–99)
Glucose-Capillary: 107 mg/dL — ABNORMAL HIGH (ref 70–99)
Glucose-Capillary: 114 mg/dL — ABNORMAL HIGH (ref 70–99)
Glucose-Capillary: 110 mg/dL — ABNORMAL HIGH (ref 70–99)
Glucose-Capillary: 112 mg/dL — ABNORMAL HIGH (ref 70–99)

## 2019-03-09 LAB — BRAIN NATRIURETIC PEPTIDE: B Natriuretic Peptide: 279 pg/mL — ABNORMAL HIGH (ref 0.0–100.0)

## 2019-03-09 MED ORDER — ALBUTEROL SULFATE (2.5 MG/3ML) 0.083% IN NEBU: 2.5000 mg | INHALATION_SOLUTION | Freq: Four times a day (QID) | RESPIRATORY_TRACT | Status: DC | PRN

## 2019-03-09 MED ORDER — ALBUTEROL SULFATE HFA 108 (90 BASE) MCG/ACT IN AERS: 2.0000 | INHALATION_SPRAY | Freq: Four times a day (QID) | RESPIRATORY_TRACT | Status: DC | PRN

## 2019-03-09 MED ORDER — METOPROLOL TARTRATE 5 MG/5ML IV SOLN
5.0000 mg | INTRAVENOUS | Status: DC | PRN
  Administered 2019-03-09: 5 mg via INTRAVENOUS
  Filled 2019-03-09: qty 5

## 2019-03-09 MED ORDER — ONDANSETRON HCL 4 MG PO TABS: 4.0000 mg | ORAL_TABLET | Freq: Four times a day (QID) | ORAL | Status: DC | PRN

## 2019-03-09 MED ORDER — INSULIN ASPART 100 UNIT/ML ~~LOC~~ SOLN: 0.0000 [IU] | Freq: Every day | SUBCUTANEOUS | Status: DC

## 2019-03-09 MED ORDER — PANTOPRAZOLE SODIUM 40 MG PO TBEC: 40.0000 mg | DELAYED_RELEASE_TABLET | ORAL | Status: DC | PRN

## 2019-03-09 MED ORDER — CALCIUM CARBONATE ANTACID 500 MG PO CHEW
1.0000 | CHEWABLE_TABLET | Freq: Two times a day (BID) | ORAL | Status: DC
  Administered 2019-03-09 – 2019-03-11 (×5): 200 mg via ORAL
  Filled 2019-03-09 (×5): qty 1

## 2019-03-09 MED ORDER — SODIUM CHLORIDE 0.9% FLUSH
10.0000 mL | Freq: Two times a day (BID) | INTRAVENOUS | Status: DC
  Administered 2019-03-09 – 2019-03-11 (×5): 10 mL via INTRAVENOUS

## 2019-03-09 MED ORDER — DILTIAZEM HCL ER COATED BEADS 240 MG PO CP24
240.0000 mg | ORAL_CAPSULE | Freq: Every day | ORAL | Status: DC
  Administered 2019-03-09 – 2019-03-11 (×3): 240 mg via ORAL
  Filled 2019-03-09 (×2): qty 2
  Filled 2019-03-09 (×3): qty 1
  Filled 2019-03-09: qty 2

## 2019-03-09 MED ORDER — CEPHALEXIN 250 MG PO CAPS
250.0000 mg | ORAL_CAPSULE | Freq: Three times a day (TID) | ORAL | Status: DC
  Administered 2019-03-09 – 2019-03-11 (×5): 250 mg via ORAL
  Filled 2019-03-09 (×7): qty 1

## 2019-03-09 MED ORDER — ACETAMINOPHEN 650 MG RE SUPP: 650.0000 mg | Freq: Four times a day (QID) | RECTAL | Status: DC | PRN

## 2019-03-09 MED ORDER — PRAVASTATIN SODIUM 40 MG PO TABS
40.0000 mg | ORAL_TABLET | Freq: Every day | ORAL | Status: DC
  Administered 2019-03-10: 40 mg via ORAL
  Filled 2019-03-09 (×2): qty 1

## 2019-03-09 MED ORDER — DULOXETINE HCL 30 MG PO CPEP
30.0000 mg | ORAL_CAPSULE | Freq: Every day | ORAL | Status: DC
  Administered 2019-03-10: 30 mg via ORAL
  Filled 2019-03-09 (×3): qty 1

## 2019-03-09 MED ORDER — APIXABAN 5 MG PO TABS
5.0000 mg | ORAL_TABLET | Freq: Two times a day (BID) | ORAL | Status: DC
  Administered 2019-03-09 (×2): 5 mg via ORAL
  Filled 2019-03-09 (×3): qty 1

## 2019-03-09 MED ORDER — INSULIN ASPART 100 UNIT/ML ~~LOC~~ SOLN
0.0000 [IU] | Freq: Three times a day (TID) | SUBCUTANEOUS | Status: DC
  Administered 2019-03-10 – 2019-03-11 (×2): 1 [IU] via SUBCUTANEOUS
  Filled 2019-03-09 (×2): qty 1

## 2019-03-09 MED ORDER — MOMETASONE FURO-FORMOTEROL FUM 200-5 MCG/ACT IN AERO
2.0000 | INHALATION_SPRAY | Freq: Two times a day (BID) | RESPIRATORY_TRACT | Status: DC
  Administered 2019-03-09 – 2019-03-11 (×4): 2 via RESPIRATORY_TRACT
  Filled 2019-03-09: qty 8.8

## 2019-03-09 MED ORDER — ANASTROZOLE 1 MG PO TABS
1.0000 mg | ORAL_TABLET | Freq: Every day | ORAL | Status: DC
  Administered 2019-03-10: 1 mg via ORAL
  Filled 2019-03-09 (×3): qty 1

## 2019-03-09 MED ORDER — CEPHALEXIN 500 MG PO CAPS
500.0000 mg | ORAL_CAPSULE | Freq: Three times a day (TID) | ORAL | Status: DC
  Administered 2019-03-09: 500 mg via ORAL
  Filled 2019-03-09: qty 1

## 2019-03-09 MED ORDER — MECLIZINE HCL 12.5 MG PO TABS
12.5000 mg | ORAL_TABLET | Freq: Three times a day (TID) | ORAL | Status: DC | PRN
  Administered 2019-03-09 (×2): 12.5 mg via ORAL
  Filled 2019-03-09 (×3): qty 1

## 2019-03-09 MED ORDER — MONTELUKAST SODIUM 10 MG PO TABS
10.0000 mg | ORAL_TABLET | Freq: Every day | ORAL | Status: DC
  Administered 2019-03-09 – 2019-03-10 (×2): 10 mg via ORAL
  Filled 2019-03-09 (×2): qty 1

## 2019-03-09 MED ORDER — ACETAMINOPHEN 325 MG PO TABS
650.0000 mg | ORAL_TABLET | Freq: Four times a day (QID) | ORAL | Status: DC | PRN
  Administered 2019-03-09 – 2019-03-10 (×2): 650 mg via ORAL
  Filled 2019-03-09 (×2): qty 2

## 2019-03-09 MED ORDER — ONDANSETRON HCL 4 MG/2ML IJ SOLN
4.0000 mg | Freq: Four times a day (QID) | INTRAMUSCULAR | Status: DC | PRN
  Administered 2019-03-10: 4 mg via INTRAVENOUS
  Filled 2019-03-09: qty 2

## 2019-03-09 NOTE — ED Notes (Signed)
ED TO INPATIENT HANDOFF REPORT  ED Nurse Name and Phone #:  Jerald Kief, RN [phone]  S Name/Age/Gender Rachael Horne 85 y.o. female Room/Bed: ED26A/ED26A  Code Status   Code Status: Prior  Home/SNF/Other Home Patient oriented to: self, place, time and situation Is this baseline? Yes   Triage Complete: Triage complete  Chief Complaint weakness  Triage Note Reports weakness and decreased mobility, especially todoay.   Allergies Allergies  Allergen Reactions  . Amoxicillin Rash    Level of Care/Admitting Diagnosis ED Disposition    ED Disposition Condition [facility]: [place] [[id number]]  Level of Care: Telemetry [5]  Diagnosis: Acute on chronic combined systolic and diastolic CHF (congestive heart failure) [facility]) [[id number]]  Admitting Physician: Lance Coon [[id number]]  Attending Physician: Lance Coon [[id number]]  Bed request comments: 2a  PT Class (Do Not Modify): Observation [104]  PT Acc Code (Do Not Modify): Observation [10022]       B Medical/Surgery History Past Medical History:  Diagnosis Date  . Anxiety   . Arthritis   . Atrial fibrillation ([place])   . Breast cancer [facility]) 2013   right breast, radiation  . Breast cancer ([place]) 2007   left breast, radiation  . Cancer ([place])    BL breast  . CHF (congestive heart failure) ([place])   . Coronary artery disease   . Diabetes mellitus without complication ([place])   . DVT (deep venous thrombosis) ([place]) 2003  . Dyspnea    with exertion  . Dysrhythmia   . GERD (gastroesophageal reflux disease)   . Hyperlipemia   . Hypertension   . Personal history of radiation therapy 2013   RIGHT lumpectomy  . Stroke [facility]) 2007   TIA  . Traumatic hematoma of left lower leg 07/2017   Past Surgical History:  Procedure Laterality Date  . APPENDECTOMY    . APPLICATION OF WOUND VAC Left 08/02/2017   Procedure: APPLICATION OF WOUND VAC;  Surgeon: Leim Fabry, MD;  Location:  [facility] ORS;  Service: Orthopedics;  Laterality: Left;  . BILATERAL CARPAL TUNNEL RELEASE Bilateral   . BREAST LUMPECTOMY Right 2013   RIGHT lumpectomy w/ radiation 2013  . BREAST LUMPECTOMY Left 2009   LEFT LUMPECTOMY W/ RADIATION 2009  . CARDIAC CATHETERIZATION    . CHOLECYSTECTOMY    . EYE SURGERY Bilateral    Cataract Extraction with IOL  . HERNIA REPAIR     Umbilical Hernia Repair  . I&D EXTREMITY Left 08/02/2017   Procedure: I & D LEFT LEG HEMATOMA;  Surgeon: Leim Fabry, MD;  Location: [facility] ORS;  Service: Orthopedics;  Laterality: Left;  . TONSILLECTOMY       A IV Location/Drains/Wounds Patient Lines/Drains/Airways Status   Active Line/Drains/Airways    Name:   Placement date:   Placement time:   Site:   Days:   Peripheral IV 03/02/19 Left Antecubital   03/02/19    1613    Antecubital   7   Peripheral IV 03/08/19 Left Antecubital   03/08/19    2327    Antecubital   1   Incision (Closed) 08/02/17 Leg Left   08/02/17    1822     584          Intake/Output Last 24 hours No intake or output data in the 24 hours ending 03/09/19 0157  Labs/Imaging Results for orders placed or performed during the hospital encounter of 03/08/19 (from the past 48 hour(s))  CBC  Status: Abnormal   Collection Time: 03/08/19 11:28 PM  Result Value Ref Range   WBC 7.0 4.0 - 10.5 K/uL   RBC 3.91 3.87 - 5.11 MIL/uL   Hemoglobin 11.2 (L) 12.0 - 15.0 g/dL   HCT 33.2 (L) 36.0 - 46.0 %   MCV 84.9 80.0 - 100.0 fL   MCH 28.6 26.0 - 34.0 pg   MCHC 33.7 30.0 - 36.0 g/dL   RDW 16.5 (H) 11.5 - 15.5 %   Platelets 304 150 - 400 K/uL   nRBC 0.0 0.0 - 0.2 %    Comment: Performed at [facility], [address] [place], [place] [address]  Comprehensive metabolic panel     Status: Abnormal   Collection Time: 03/08/19 11:28 PM  Result Value Ref Range   Sodium 130 (L) 135 - 145 mmol/L   Potassium 3.9 3.5 - 5.1 mmol/L   Chloride 91 (L) 98 - 111 mmol/L   CO2 27 22 - 32 mmol/L   Glucose, Bld  114 (H) 70 - 99 mg/dL   BUN 26 (H) 8 - 23 mg/dL   Creatinine, Ser 1.46 (H) 0.44 - 1.00 mg/dL   Calcium 8.6 (L) 8.9 - 10.3 mg/dL   Total Protein 6.7 6.5 - 8.1 g/dL   Albumin 3.6 3.5 - 5.0 g/dL   AST 22 15 - 41 U/L   ALT 23 0 - 44 U/L   Alkaline Phosphatase 61 38 - 126 U/L   Total Bilirubin 0.4 0.3 - 1.2 mg/dL   GFR calc non Af Amer 32 (L) >60 mL/min   GFR calc Af Amer 37 (L) >60 mL/min   Anion gap 12 5 - 15    Comment: Performed at [facility], [address] [place], [place] [address]  Troponin I - ONCE - STAT     Status: None   Collection Time: 03/08/19 11:28 PM  Result Value Ref Range   Troponin I <0.03 <0.03 ng/mL    Comment: Performed at [facility], [address] [place], [place] [address]  Urinalysis, Complete w Microscopic     Status: Abnormal   Collection Time: 03/08/19 11:28 PM  Result Value Ref Range   Color, Urine YELLOW (A) YELLOW   APPearance CLEAR (A) CLEAR   Specific Gravity, Urine 1.017 1.005 - 1.030   pH 7.0 5.0 - 8.0   Glucose, UA NEGATIVE NEGATIVE mg/dL   Hgb urine dipstick NEGATIVE NEGATIVE   Bilirubin Urine NEGATIVE NEGATIVE   Ketones, ur NEGATIVE NEGATIVE mg/dL   Protein, ur NEGATIVE NEGATIVE mg/dL   Nitrite NEGATIVE NEGATIVE   Leukocytes,Ua NEGATIVE NEGATIVE   RBC / HPF 0-5 0 - 5 RBC/hpf   WBC, UA 0-5 0 - 5 WBC/hpf   Bacteria, UA RARE (A) NONE SEEN   Squamous Epithelial / LPF 0-5 0 - 5    Comment: Performed at [facility], [address], [place], [place] [address]  Brain natriuretic peptide     Status: Abnormal   Collection Time: 03/08/19 11:28 PM  Result Value Ref Range   B Natriuretic Peptide 279.0 (H) 0.0 - 100.0 pg/mL    Comment: Performed at [facility], [address], [place], [place] [address]   Dg Chest Portable 1 View  Result Date: 03/08/2019 CLINICAL DATA:  Hypoxia. Weakness. EXAM: PORTABLE CHEST 1 VIEW COMPARISON:  Report from chest radiograph 12/23/2018, images not available. Radiograph  11/05/2015 FINDINGS: Cardiomegaly is unchanged. Unchanged mediastinal contours. Interstitial/bronchial coarsening, chronic and unchanged. No acute airspace disease.  No evidence of pulmonary edema, pleural effusion or pneumothorax. No acute osseous abnormalities are seen. IMPRESSION: Stable cardiomegaly and chronic interstitial coarsening. Electronically Signed   By: Keith Rake M.D.   On: 03/08/2019 23:50    Pending Labs [employer] (From admission, onward)    Start     Ordered   Signed and Occupational hygienist morning,   R     Signed and Held   Signed and Held  CBC  Tomorrow morning,   R     Signed and Held          Vitals/Pain Today's Vitals   03/08/19 2259 03/08/19 2305 03/08/19 2330 03/09/19 0000  BP: (!) 189/62 (!) 189/62 (!) 166/54 (!) 187/66  Pulse: 76 74 78 77  Resp: 16 19 17 15   Temp: 98.6 F (37 C)     TempSrc: Oral     SpO2: 94% 91% 91% 94%  PainSc:        Isolation Precautions No active isolations  Medications Medications - No data to display  Mobility walks with 2 person assist Moderate fall risk   Focused Assessments Cardiac Assessment Handoff:  Cardiac Rhythm: Atrial fibrillation Lab Results  Component Value Date   TROPONINI <0.03 03/08/2019   No results found for: DDIMER Does the Patient currently have chest pain? No     R Recommendations: See Admitting Provider Note  Report given to:   Additional Notes: Pt reports onset of generalized weakness today, requiring 2 person assist to transfer. Pt normally ambulate with device and lives independently. Pt is alert and oriented x 4. Pitting edema to bilateral leg. In A-fib, rate controlled.

## 2019-03-09 NOTE — Consult Note (Signed)
[facility] Cardiology  CARDIOLOGY CONSULT NOTE  Patient ID: Rachael Horne MRN: [id number] DOB/AGE: 04/11/1928 85 y.o.  Admit date: 03/08/2019 Referring Physician Jannifer Franklin Primary Physician [facility] Primary Cardiologist Ryland T[place]ate Reason for Consultation congestive heart failure  HPI: 85 year old female referred for evaluation of congestive heart failure.  Recently had orbital cellulitis of the right eye, underwent I&D and treated with antibiotics.  Sent to [facility] ED with Ms. and her legs, with inability to stand up.  Patient labs were notable for BUN and creatinine of 26 and 1.42, and BNP of 279.  The patient denies chest pain or shortness of breath, but does have persistent peripheral edema, taking furosemide 40 mg twice daily and metolazone 2.5 mg Monday Wednesday Friday.  Patient has paroxysmal atrial fibrillation, on Eliquis for stroke prevention.  2D echocardiogram 07/05/2017 revealed normal left ventricular function, with LVEF greater than 55%, with moderate tricuspid regurgitation.  Review of systems complete and found to be negative unless listed above     Past Medical History:  Diagnosis Date  . Anxiety   . Arthritis   . Atrial fibrillation ([place])   . Breast cancer [facility]) 2013   right breast, radiation  . Breast cancer ([place]) 2007   left breast, radiation  . Cancer ([place])    BL breast  . CHF (congestive heart failure) ([place])   . Coronary artery disease   . Diabetes mellitus without complication ([place])   . DVT (deep venous thrombosis) ([place]) 2003  . Dyspnea    with exertion  . Dysrhythmia   . GERD (gastroesophageal reflux disease)   . Hyperlipemia   . Hypertension   . Personal history of radiation therapy 2013   RIGHT lumpectomy  . Stroke [facility]) 2007   TIA  . Traumatic hematoma of left lower leg 07/2017    Past Surgical History:  Procedure Laterality Date  . APPENDECTOMY    . APPLICATION OF WOUND VAC Left 08/02/2017   Procedure: APPLICATION OF WOUND VAC;  Surgeon: Leim Fabry, MD;   Location: [facility] ORS;  Service: Orthopedics;  Laterality: Left;  . BILATERAL CARPAL TUNNEL RELEASE Bilateral   . BREAST LUMPECTOMY Right 2013   RIGHT lumpectomy w/ radiation 2013  . BREAST LUMPECTOMY Left 2009   LEFT LUMPECTOMY W/ RADIATION 2009  . CARDIAC CATHETERIZATION    . CHOLECYSTECTOMY    . EYE SURGERY Bilateral    Cataract Extraction with IOL  . HERNIA REPAIR     Umbilical Hernia Repair  . I&D EXTREMITY Left 08/02/2017   Procedure: I & D LEFT LEG HEMATOMA;  Surgeon: Leim Fabry, MD;  Location: [facility] ORS;  Service: Orthopedics;  Laterality: Left;  . TONSILLECTOMY      Medications Prior to Admission  Medication Sig Dispense Refill Last Dose  . acetaminophen (TYLENOL) 500 MG tablet Take 1,000 mg by mouth 2 (two) times daily as needed for mild pain.   03/08/2019 at 1500  . albuterol (PROVENTIL HFA;VENTOLIN HFA) 108 (90 Base) MCG/ACT inhaler Inhale 2 puffs into the lungs every 6 (six) hours as needed for wheezing or shortness of breath.   prn at prn  . alendronate (FOSAMAX) 70 MG tablet TAKE ONE TABLET BY MOUTH EACH WEEK, ON AN EMPTY STOMACH BEFORE BREAKFAST WITH 8oz OF WATER AND REMAIN UPRIGHT FOR :30 12 tablet 3 Past Month at Unknown time  . ALPRAZolam (XANAX) 0.25 MG tablet Take 0.5 tablets (0.125 mg total) by mouth at bedtime as needed for anxiety or sleep. 30 tablet 0 03/08/2019 at 2000  . apixaban (ELIQUIS)  5 MG TABS tablet Take 5 mg by mouth 2 (two) times daily.   03/08/2019 at 2000  . budesonide-formoterol (SYMBICORT) 160-4.5 MCG/ACT inhaler Inhale 2 puffs into the lungs daily.    03/08/2019 at Unknown time  . calcium carbonate (ANTACID CALCIUM) 500 MG chewable tablet Chew 1 tablet by mouth 2 (two) times daily.   03/08/2019 at Unknown time  . desloratadine (CLARINEX) 5 MG tablet Take by mouth.   03/08/2019 at Unknown time  . diltiazem (DILACOR XR) 240 MG 24 hr capsule Take 240 mg by mouth daily.   03/08/2019 at 0800  . diphenhydramine-acetaminophen (TYLENOL PM) 25-500 MG TABS Take 1 tablet by  mouth at bedtime as needed (for sleep).   prn at prn  . erythromycin ophthalmic ointment Apply 1 application to eye at bedtime.     . fluticasone (FLONASE) 50 MCG/ACT nasal spray Place 2 sprays into both nostrils as needed.    prn at prn  . furosemide (LASIX) 40 MG tablet Take 40 mg by mouth 2 (two) times daily.    03/08/2019 at 2030  . gabapentin (NEURONTIN) 100 MG capsule    Taking  . metolazone (ZAROXOLYN) 2.5 MG tablet Take 2.5 mg by mouth 3 (three) times a week. Mon. Wed. Fri   03/06/2019 at 0800  . montelukast (SINGULAIR) 10 MG tablet Take 10 mg by mouth at bedtime.   03/08/2019 at 2000  . Multiple Vitamin (MULTIVITAMIN WITH MINERALS) TABS tablet Take 1 tablet by mouth daily at 12 noon.   03/08/2019 at Unknown time  . senna-docusate (SENOKOT-S) 8.6-50 MG tablet Take 1 tablet by mouth 2 (two) times daily as needed for mild constipation or moderate constipation. 30 tablet 0 prn at prn  . sodium chloride (OCEAN) 0.65 % SOLN nasal spray Place 1 spray into both nostrils as needed for congestion. 1 Bottle 0 03/08/2019 at Unknown time  . sulfamethoxazole-trimethoprim (BACTRIM DS,SEPTRA DS) 800-160 MG tablet Take 2 tablets by mouth 2 (two) times daily.     Marland Kitchen GLUCOSAMINE-CHONDROITIN PO Take 1 tablet by mouth daily.   Taking  . glucose blood test strip TEST BLOOD SUGAR DAILY AS DIRECTED   Taking  . loratadine (CLARITIN) 10 MG tablet Take 10 mg by mouth daily as needed for allergies.   Taking  . magnesium oxide (MAG-OX) 400 MG tablet Take 400 mg by mouth daily at 12 noon.   Not Taking at Unknown time  . pantoprazole (PROTONIX) 40 MG tablet Take 40 mg by mouth as needed.    Taking   Social History   Socioeconomic History  . Marital status: Widowed    Spouse name: Not on file  . Number of children: Not on file  . Years of education: Not on file  . Highest education level: Not on file  Occupational History  . Not on file  Social Needs  . Financial resource strain: Not on file  . Food insecurity:     Worry: Not on file    Inability: Not on file  . Transportation needs:    Medical: Not on file    Non-medical: Not on file  Tobacco Use  . Smoking status: Never Smoker  . Smokeless tobacco: Never Used  Substance and Sexual Activity  . Alcohol use: No  . Drug use: No  . Sexual activity: Never  Lifestyle  . Physical activity:    Days per week: Not on file    Minutes per session: Not on file  . Stress: Not on file  Relationships  .  Social connections:    Talks on phone: Not on file    Gets together: Not on file    Attends religious service: Not on file    Active member of club or organization: Not on file    Attends meetings of clubs or organizations: Not on file    Relationship status: Not on file  . Intimate partner violence:    Fear of current or ex partner: Not on file    Emotionally abused: Not on file    Physically abused: Not on file    Forced sexual activity: Not on file  Other Topics Concern  . Not on file  Social History Narrative  . Not on file    Family History  Problem Relation Age of Onset  . Breast cancer Neg Hx       Review of systems complete and found to be negative unless listed above      PHYSICAL EXAM  General: Well developed, well nourished, in no acute distress HEENT:  Normocephalic and atramatic Neck:  No JVD.  Lungs: Clear bilaterally to auscultation and percussion. Heart: HRRR . Normal S1 and S2 without gallops or murmurs.  Abdomen: Bowel sounds are positive, abdomen soft and non-tender  Msk:  Back normal, normal gait. Normal strength and tone for age. Extremities: No clubbing, cyanosis or edema.   Neuro: Alert and oriented X 3. Psych:  Good affect, responds appropriately  Labs:   Lab Results  Component Value Date   WBC 6.2 03/09/2019   HGB 11.2 (L) 03/09/2019   HCT 33.9 (L) 03/09/2019   MCV 86.5 03/09/2019   PLT 280 03/09/2019    Recent Labs  Lab 03/08/19 2328 03/09/19 0440  NA 130* 131*  K 3.9 3.8  CL 91* 92*  CO2 27  29  BUN 26* 23  CREATININE 1.46* 1.24*  CALCIUM 8.6* 8.7*  PROT 6.7  --   BILITOT 0.4  --   ALKPHOS 61  --   ALT 23  --   AST 22  --   GLUCOSE 114* 111*   Lab Results  Component Value Date   TROPONINI <0.03 03/08/2019   No results found for: CHOL No results found for: HDL No results found for: LDLCALC No results found for: TRIG No results found for: CHOLHDL No results found for: LDLDIRECT    Radiology: Ct Head Wo Contrast  Result Date: 03/02/2019 CLINICAL DATA:  Right eye pain and swelling for 3 days EXAM: CT HEAD WITHOUT CONTRAST TECHNIQUE: Contiguous axial images were obtained from the base of the skull through the vertex without intravenous contrast. COMPARISON:  05/24/2018 FINDINGS: Brain: Stable brain atrophy pattern and chronic white matter microvascular changes. Remote right frontal subcortical white matter infarct again noted. No acute intracranial hemorrhage, definite new infarction, mass lesion, midline shift, or extra-axial fluid collection. Cisterns are patent. Cerebellar atrophy as well. Vascular: No hyperdense vessel or unexpected calcification. Skull: Normal. Negative for fracture or focal lesion. Sinuses/Orbits: No acute finding. Other: None. IMPRESSION: Stable brain atrophy pattern and chronic white matter microvascular ischemic changes. Remote right frontal subcortical white matter infarct as before. No interval change or acute intracranial abnormality by noncontrast CT. Electronically Signed   By: Jerilynn Mages.  Shick M.D.   On: 03/02/2019 13:31   Dg Chest Portable 1 View  Result Date: 03/08/2019 CLINICAL DATA:  Hypoxia. Weakness. EXAM: PORTABLE CHEST 1 VIEW COMPARISON:  Report from chest radiograph 12/23/2018, images not available. Radiograph 11/05/2015 FINDINGS: Cardiomegaly is unchanged. Unchanged mediastinal contours. Interstitial/bronchial coarsening, chronic  and unchanged. No acute airspace disease. No evidence of pulmonary edema, pleural effusion or pneumothorax. No acute  osseous abnormalities are seen. IMPRESSION: Stable cardiomegaly and chronic interstitial coarsening. Electronically Signed   By: Keith Rake M.D.   On: 03/08/2019 23:50   Ct Orbits W Contrast  Result Date: 03/02/2019 CLINICAL DATA:  Elderly patient with pain and swelling to RIGHT eye for 2-3 days. EXAM: CT ORBITS WITH CONTRAST TECHNIQUE: Multidetector CT images was performed according to the standard protocol following intravenous contrast administration. CONTRAST:  48mL OMNIPAQUE IOHEXOL 300 MG/ML  SOLN COMPARISON:  None. FINDINGS: Orbits: There is proptosis of the RIGHT globe. There is a well-circumscribed mass in the medial orbit extending to the medial canthus, which appears to arise from the nasolacrimal duct, with a well-defined and avidly enhancing circumscribed margin, low attenuation central component, measuring 11 x 18 x 12 mm. There is mild preseptal periorbital soft tissue swelling. No retrobulbar mass. Globes are otherwise symmetric status post BILATERAL cataract extraction. Visualized sinuses: There is minor ethmoid sinus disease, but this is not a contributing factor to the mass. There is no significant maxillary, frontal, or sphenoid sinus fluid. Soft tissues: Other than the RIGHT orbital mass, none. Limited intracranial: Normal for age cerebral volume. No significant or unexpected findings. IMPRESSION: 1. 11 x 18 x 12 mm well-circumscribed RIGHT medial orbital/canthal mass, likely arising from the nasolacrimal duct, with proptosis of the RIGHT globe. Preseptal periorbital soft tissue swelling without retrobulbar mass. 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 Ophthalmologic consultation is warranted. Electronically Signed   By: Staci Righter M.D.   On: 03/02/2019 17:26    EKG:   ASSESSMENT AND PLAN:   1.  Chronic diastolic congestive heart failure, with known normal left ventricular function, manifested with peripheral edema, on regimen of furosemide 40 mg twice  daily and metolazone 2.5 mg Monday Wednesday Friday, diuretics being held due to acute kidney injury  2.  Acute kidney injury 3.  Paroxysmal atrial fibrillation, chads Vascor 7, on Eliquis for stroke prevention  Recommendations  1.  Continue current medications 2.  Continue Eliquis for stroke prevention 3.  Would consult nephrology to evaluate acute kidney injury in patient with persistent peripheral edema, on home furosemide and intermittent metolazone.  Hold diuretics for now. 4.  Review 2D echocardiogram  Signed: Isaias Cowman MD,PhD, [facility] 03/09/2019, 8:21 AM

## 2019-03-09 NOTE — ED Notes (Signed)
Updated Son on pt status.

## 2019-03-09 NOTE — TOC Initial Note (Signed)
Transition of Care [facility]) - Initial/Assessment Note    Patient Details  Name: Rachael Horne MRN: [id number] Date of Birth: 1928/03/08  Transition of Care [facility]) CM/SW Contact:    Ross Ludwig, LCSW Phone Number: 03/09/2019, 5:59 PM  Clinical Narrative:                  Patient is a 85 year old female who is alert and oriented x4.  Patient states that she would like to go home, but understands that she needs some short term rehab before she is able to return.  Patient has been to rehab in the past, she has been to [facility] and [facility], she would prefer to go somewhere different then [facility], due to not having a good experience.  Patient was explained about the different facilities, and how the rating system worked on the [employer].gov handoff, patient also updated patient's son, and he said his wife is at [facility], and he is interested in having patient go there if possible.  CSW explained how insurance will pay for the stay and what to expect.  CSW was given patient options for SNF, and also given permission to begin bed search in [employer].  Patient and her son did not have any other questions or concerns.  Expected Discharge Plan: [place] Patient Goals and CMS Choice Patient states their goals for this hospitalization and ongoing recovery are:: Patient would like to return back home, but understands that she needs to go to some rehab first before returning back home. CMS Medicare.gov Compare Post Acute Care list provided to:: Patient Choice offered to / list presented to : Patient, Adult Children  Expected Discharge Plan and Services Expected Discharge Plan: [place] In-house Referral: Clinical Social Work   Post Acute Care Choice: [place] Living arrangements for the past 2 months: [place]                 DME Arranged: N/A        Prior Living Arrangements/Services Living arrangements for the  past 2 months: Single Family Home Lives with:: Self Patient language and need for interpreter reviewed:: No Do you feel safe going back to the place where you live?: No   Patient feels she needs some short term rehab before returning back home.  Need for Family Participation in Patient Care: Yes (Comment) Care giver support system in place?: No (comment) Current home services: Other (comment)(NA) Criminal Activity/Legal Involvement Pertinent to Current Situation/Hospitalization: No - Comment as needed  Activities of Daily Living Home Assistive Devices/Equipment: CBG Meter, Blood pressure cuff ADL Screening (condition at time of admission) Patient's cognitive ability adequate to safely complete daily activities?: Yes Is the patient deaf or have difficulty hearing?: No Does the patient have difficulty seeing, even when wearing glasses/contacts?: No Does the patient have difficulty concentrating, remembering, or making decisions?: No Patient able to express need for assistance with ADLs?: Yes Does the patient have difficulty dressing or bathing?: No Independently performs ADLs?: Yes (appropriate for developmental age) Does the patient have difficulty walking or climbing stairs?: Yes Weakness of Legs: Both Weakness of Arms/Hands: Both  Permission Sought/Granted Permission sought to share information with : Facility Sport and exercise psychologist, Family Supports Permission granted to share information with : Yes, Verbal Permission Granted  Share Information with NAME: Annlee, Glandon [phone]  [phone]   Permission granted to share info w AGENCY: SNF admissions  Permission granted to share info w Relationship:  Patient's son  Permission granted to share info w Contact Information: SNF admissions  Emotional Assessment Appearance:: Appears stated age   Affect (typically observed): Accepting, Appropriate, Calm, Stable Orientation: : Oriented to Self, Oriented to Place, Oriented to  Time,  Oriented to Situation Alcohol / Substance Use: Not Applicable Psych Involvement: No (comment)  Admission diagnosis:  Renal insufficiency [N28.9] Generalized weakness [R53.1] Patient Active Problem List   Diagnosis Date Noted  . Hematoma of leg 08/02/2017  . SOB (shortness of breath) on exertion 06/07/2017  . Menopausal osteoporosis 09/10/2016  . Chronic hip pain, left 08/08/2016  . Chronic midline low back pain without sciatica 08/08/2016  . Acute on chronic combined systolic and diastolic CHF (congestive heart failure) ([place]) 12/28/2015  . Sepsis ([place]) 11/03/2015  . Acute bronchitis 11/03/2015  . Fever 11/03/2015  . Atrial fibrillation with RVR ([place]) 11/03/2015  . Atrial fibrillation ([place]) 08/22/2015  . Primary cancer of upper outer quadrant of right female breast ([place]) 08/22/2015  . Diabetes mellitus type 2, uncomplicated ([place]) 88/28/0034  . Environmental allergies 08/22/2015  . HLD (hyperlipidemia) 08/22/2015  . Hypertension 08/22/2015  . MR (mitral regurgitation) 08/22/2015  . Arthritis of hand, degenerative 08/22/2015  . TR (tricuspid regurgitation) 08/22/2015  . Bradycardia 08/23/2014  . CAD (coronary artery disease) 08/23/2014  . History of asthma 08/23/2014  . H/O deep venous thrombosis 08/23/2014  . History of tear of meniscus of knee joint 08/23/2014  . Osteoarthritis of both hands 08/23/2014  . Obesity, unspecified 08/23/2014  . TIA (transient ischemic attack) 08/23/2014  . Palpitations 08/23/2014  . Anemia, unspecified 05/06/2014  . Breast cancer ([place]) 12/03/2005  . History of CVA (cerebrovascular accident) 07/03/1997   PCP:  Idelle Crouch, MD Pharmacy:   [facility] [address], [place] [address] Phone: [phone] Fax: [place], [place] [address] [place] [address] Phone: [phone] Fax: [place],  [place]. [place] [address] Phone: [phone] Fax: [phone]     Social Determinants of Health (SDOH) Interventions    Readmission Risk Interventions No flowsheet data found.

## 2019-03-09 NOTE — NC FL2 (Signed)
[place] LEVEL OF CARE SCREENING TOOL     IDENTIFICATION  Patient Name: Rachael Horne Birthdate: 03/16/1928 Sex: female Admission Date (Current Location): 03/08/2019  [place] and [place] Number:  Engineering geologist and Address:  [facility], [address], [place], [place] [address]      Provider Number: [id number]  Attending Physician Name and Address:  Dustin Flock, MD  Relative Name and Phone Number:  Rickman,[phone]    Current Level of Care: Hospital Recommended Level of Care: [place] Prior Approval Number:    Date Approved/Denied:   PASRR Number: [id number]  Discharge Plan: SNF    Current Diagnoses: Patient Active Problem List   Diagnosis Date Noted  . Hematoma of leg 08/02/2017  . SOB (shortness of breath) on exertion 06/07/2017  . Menopausal osteoporosis 09/10/2016  . Chronic hip pain, left 08/08/2016  . Chronic midline low back pain without sciatica 08/08/2016  . Acute on chronic combined systolic and diastolic CHF (congestive heart failure) ([place]) 12/28/2015  . Sepsis ([place]) 11/03/2015  . Acute bronchitis 11/03/2015  . Fever 11/03/2015  . Atrial fibrillation with RVR ([place]) 11/03/2015  . Atrial fibrillation ([place]) 08/22/2015  . Primary cancer of upper outer quadrant of right female breast ([place]) 08/22/2015  . Diabetes mellitus type 2, uncomplicated ([place]) 27/74/1287  . Environmental allergies 08/22/2015  . HLD (hyperlipidemia) 08/22/2015  . Hypertension 08/22/2015  . MR (mitral regurgitation) 08/22/2015  . Arthritis of hand, degenerative 08/22/2015  . TR (tricuspid regurgitation) 08/22/2015  . Bradycardia 08/23/2014  . CAD (coronary artery disease) 08/23/2014  . History of asthma 08/23/2014  . H/O deep venous thrombosis 08/23/2014  . History of tear of meniscus of knee joint 08/23/2014  . Osteoarthritis of both hands 08/23/2014  . Obesity, unspecified 08/23/2014  .  TIA (transient ischemic attack) 08/23/2014  . Palpitations 08/23/2014  . Anemia, unspecified 05/06/2014  . Breast cancer ([place]) 12/03/2005  . History of CVA (cerebrovascular accident) 07/03/1997    Orientation RESPIRATION BLADDER Height & Weight     Time, Situation, Self, Place  Normal Continent Weight: 135 lb 9.6 oz (61.5 kg) Height:  5\' 4"  (162.6 cm)  BEHAVIORAL SYMPTOMS/MOOD NEUROLOGICAL BOWEL NUTRITION STATUS      Continent Diet(Heart Healthy Carb Modified)  AMBULATORY STATUS COMMUNICATION OF NEEDS Skin   Limited Assist Verbally Normal                       Personal Care Assistance Level of Assistance  Bathing, Feeding, Dressing Bathing Assistance: Limited assistance Feeding assistance: Independent Dressing Assistance: Limited assistance     Functional Limitations Info  Sight, Hearing, Speech Sight Info: Adequate Hearing Info: Adequate Speech Info: Adequate    SPECIAL CARE FACTORS FREQUENCY  PT (By licensed PT)     PT Frequency: Minimum 5x a week              Contractures Contractures Info: Not present    Additional Factors Info  Code Status, Allergies, Insulin Sliding Scale Code Status Info: Full Code Allergies Info: Amoxicillin   Insulin Sliding Scale Info: insulin aspart (novoLOG) injection 0-9 Units        Current Medications (03/09/2019):  This is the current hospital active medication list Current Facility-Administered Medications  Medication Dose Route Frequency Provider Last Rate Last Dose  . acetaminophen (TYLENOL) tablet 650 mg  650 mg Oral Q6H PRN Lance Coon, MD       Or  . acetaminophen (TYLENOL) suppository 650 mg  650 mg Rectal Q6H PRN Lance Coon, MD      . albuterol (PROVENTIL) (2.5 MG/3ML) 0.083% nebulizer solution 2.5 mg  2.5 mg Nebulization Q6H PRN Dustin Flock, MD      . anastrozole (ARIMIDEX) tablet 1 mg  1 mg Oral Daily Lance Coon, MD      . apixaban Arne [place]) tablet 5 mg  5 mg Oral BID Lance Coon, MD   5 mg at  03/09/19 0841  . calcium carbonate (TUMS - dosed in mg elemental calcium) chewable tablet 200 mg of elemental calcium  1 tablet Oral BID Dustin Flock, MD   200 mg of elemental calcium at 03/09/19 1110  . cephALEXin (KEFLEX) capsule 250 mg  250 mg Oral Q8H Dustin Flock, MD      . diltiazem (CARDIZEM CD) 24 hr capsule 240 mg  240 mg Oral Daily Lance Coon, MD   240 mg at 03/09/19 0844  . DULoxetine (CYMBALTA) DR capsule 30 mg  30 mg Oral Daily Lance Coon, MD      . insulin aspart (novoLOG) injection 0-5 Units  0-5 Units Subcutaneous QHS Lance Coon, MD      . insulin aspart (novoLOG) injection 0-9 Units  0-9 Units Subcutaneous TID WC Lance Coon, MD      . meclizine (ANTIVERT) tablet 12.5 mg  12.5 mg Oral TID PRN Dustin Flock, MD   12.5 mg at 03/09/19 1220  . metoprolol tartrate (LOPRESSOR) injection 5 mg  5 mg Intravenous Q4H PRN Harrie Foreman, MD   5 mg at 03/09/19 0538  . mometasone-formoterol (DULERA) 200-5 MCG/ACT inhaler 2 puff  2 puff Inhalation BID Lance Coon, MD   2 puff at 03/09/19 [phone]  . montelukast (SINGULAIR) tablet 10 mg  10 mg Oral QHS Lance Coon, MD      . ondansetron [facility]) tablet 4 mg  4 mg Oral Q6H PRN Lance Coon, MD       Or  . ondansetron [facility]) injection 4 mg  4 mg Intravenous Q6H PRN Lance Coon, MD      . pantoprazole (PROTONIX) EC tablet 40 mg  40 mg Oral PRN Lance Coon, MD      . pravastatin (PRAVACHOL) tablet 40 mg  40 mg Oral q1800 Lance Coon, MD      . sodium chloride flush (NS) 0.9 % injection 10 mL  10 mL Intravenous Q12H Dustin Flock, MD   10 mL at 03/09/19 8546     Discharge Medications: Please see discharge summary for a list of discharge medications.  Relevant Imaging Results:  Relevant Lab Results:   Additional Information SSN [id number]  Ross Ludwig, LCSW

## 2019-03-09 NOTE — ED Notes (Signed)
With permission from patient Son, [name], has been updated on pt status over the phone.

## 2019-03-09 NOTE — Progress Notes (Signed)
*  PRELIMINARY RESULTS* Echocardiogram 2D Echocardiogram has been performed.  Rachael Horne 03/09/2019, 10:34 AM

## 2019-03-09 NOTE — Progress Notes (Signed)
[place] at [facility]                                                                                                                                                                                  Patient Demographics   Rachael Horne, is a 85 y.o. female, DOB - 12-21-1927, ZHY:[id number]  Admit date - 03/08/2019   Admitting Physician Lance Coon, MD  Outpatient Primary MD for the patient is Sparks, Leonie Douglas, MD   LOS - 0  Subjective: Patient admitted with weakness and dizziness, she was recently hospitalized at [facility] for orbital cellulitis Currently complains of dizziness   Review of Systems:   CONSTITUTIONAL: No documented fever. No fatigue, weakness. No weight gain, no weight loss.  EYES: No blurry or double vision.  ENT: No tinnitus. No postnasal drip. No redness of the oropharynx.  RESPIRATORY: No cough, no wheeze, no hemoptysis. No dyspnea.  CARDIOVASCULAR: No chest pain. No orthopnea. No palpitations. No syncope.  GASTROINTESTINAL: No nausea, no vomiting or diarrhea. No abdominal pain. No melena or hematochezia.  GENITOURINARY: No dysuria or hematuria.  ENDOCRINE: No polyuria or nocturia. No heat or cold intolerance.  HEMATOLOGY: No anemia. No bruising. No bleeding.  INTEGUMENTARY: No rashes. No lesions.  MUSCULOSKELETAL: No arthritis. No swelling. No gout.  NEUROLOGIC: Patient feels dizziness PSYCHIATRIC: No anxiety. No insomnia. No ADD.    Vitals:   Vitals:   03/09/19 0457 03/09/19 0540 03/09/19 0652 03/09/19 0854  BP: (!) 183/66  (!) 152/70 (!) 156/73  Pulse: 69 74 69 72  Resp:    18  Temp:    98.2 F (36.8 C)  TempSrc:    Oral  SpO2:   96% 95%  Weight:      Height:        Wt Readings from Last 3 Encounters:  03/09/19 61.5 kg  03/02/19 61.2 kg  05/24/18 64.4 kg     Intake/Output Summary (Last 24 hours) at 03/09/2019 1153 Last data filed at 03/09/2019 1050 Gross per 24 hour  Intake -  Output 250 ml  Net -250 ml     Physical Exam:   GENERAL: Pleasant-appearing in no apparent distress.  HEAD, EYES, EARS, NOSE AND THROAT: Atraumatic, normocephalic. Extraocular muscles are intact. Pupils equal and reactive to light. Sclerae anicteric. No conjunctival injection. No oro-pharyngeal erythema.  NECK: Supple. There is no jugular venous distention. No bruits, no lymphadenopathy, no thyromegaly.  HEART: Regular rate and rhythm,. No murmurs, no rubs, no clicks.  LUNGS: Clear to auscultation bilaterally. No rales or rhonchi. No wheezes.  ABDOMEN: Soft, flat, nontender, nondistended. Has good bowel sounds. No hepatosplenomegaly appreciated.  EXTREMITIES: No evidence of any  cyanosis, clubbing, or peripheral edema.  +2 pedal and radial pulses bilaterally.  NEUROLOGIC: The patient is alert, awake, and oriented x3 with no focal motor or sensory deficits appreciated bilaterally.  SKIN: Moist and warm with no rashes appreciated.  Psych: Not anxious, depressed LN: No inguinal LN enlargement    Antibiotics   Anti-infectives (From admission, onward)   None      Medications   Scheduled Meds: . anastrozole  1 mg Oral Daily  . apixaban  5 mg Oral BID  . calcium carbonate  1 tablet Oral BID  . diltiazem  240 mg Oral Daily  . DULoxetine  30 mg Oral Daily  . insulin aspart  0-5 Units Subcutaneous QHS  . insulin aspart  0-9 Units Subcutaneous TID WC  . mometasone-formoterol  2 puff Inhalation BID  . montelukast  10 mg Oral QHS  . pravastatin  40 mg Oral q1800   Continuous Infusions: PRN Meds:.acetaminophen **OR** acetaminophen, albuterol, meclizine, metoprolol tartrate, ondansetron **OR** ondansetron (ZOFRAN) IV, pantoprazole   Data Review:   Micro Results No results found for this or any previous visit (from the past 240 hour(s)).  Radiology Reports Ct Head Wo Contrast  Result Date: 03/02/2019 CLINICAL DATA:  Right eye pain and swelling for 3 days EXAM: CT HEAD WITHOUT CONTRAST TECHNIQUE: Contiguous  axial images were obtained from the base of the skull through the vertex without intravenous contrast. COMPARISON:  05/24/2018 FINDINGS: Brain: Stable brain atrophy pattern and chronic white matter microvascular changes. Remote right frontal subcortical white matter infarct again noted. No acute intracranial hemorrhage, definite new infarction, mass lesion, midline shift, or extra-axial fluid collection. Cisterns are patent. Cerebellar atrophy as well. Vascular: No hyperdense vessel or unexpected calcification. Skull: Normal. Negative for fracture or focal lesion. Sinuses/Orbits: No acute finding. Other: None. IMPRESSION: Stable brain atrophy pattern and chronic white matter microvascular ischemic changes. Remote right frontal subcortical white matter infarct as before. No interval change or acute intracranial abnormality by noncontrast CT. Electronically Signed   By: Jerilynn Mages.  Shick M.D.   On: 03/02/2019 13:31   Dg Chest Portable 1 View  Result Date: 03/08/2019 CLINICAL DATA:  Hypoxia. Weakness. EXAM: PORTABLE CHEST 1 VIEW COMPARISON:  Report from chest radiograph 12/23/2018, images not available. Radiograph 11/05/2015 FINDINGS: Cardiomegaly is unchanged. Unchanged mediastinal contours. Interstitial/bronchial coarsening, chronic and unchanged. No acute airspace disease. No evidence of pulmonary edema, pleural effusion or pneumothorax. No acute osseous abnormalities are seen. IMPRESSION: Stable cardiomegaly and chronic interstitial coarsening. Electronically Signed   By: Keith Rake M.D.   On: 03/08/2019 23:50   Ct Orbits W Contrast  Result Date: 03/02/2019 CLINICAL DATA:  Elderly patient with pain and swelling to RIGHT eye for 2-3 days. EXAM: CT ORBITS WITH CONTRAST TECHNIQUE: Multidetector CT images was performed according to the standard protocol following intravenous contrast administration. CONTRAST:  75mL OMNIPAQUE IOHEXOL 300 MG/ML  SOLN COMPARISON:  None. FINDINGS: Orbits: There is proptosis of the  RIGHT globe. There is a well-circumscribed mass in the medial orbit extending to the medial canthus, which appears to arise from the nasolacrimal duct, with a well-defined and avidly enhancing circumscribed margin, low attenuation central component, measuring 11 x 18 x 12 mm. There is mild preseptal periorbital soft tissue swelling. No retrobulbar mass. Globes are otherwise symmetric status post BILATERAL cataract extraction. Visualized sinuses: There is minor ethmoid sinus disease, but this is not a contributing factor to the mass. There is no significant maxillary, frontal, or sphenoid sinus fluid. Soft tissues: Other than the  RIGHT orbital mass, none. Limited intracranial: Normal for age cerebral volume. No significant or unexpected findings. IMPRESSION: 1. 11 x 18 x 12 mm well-circumscribed RIGHT medial orbital/canthal mass, likely arising from the nasolacrimal duct, with proptosis of the RIGHT globe. Preseptal periorbital soft tissue swelling without retrobulbar mass. 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 Ophthalmologic consultation is warranted. Electronically Signed   By: Staci Righter M.D.   On: 03/02/2019 17:26     CBC Recent Labs  Lab 03/08/19 2328 03/09/19 0440  WBC 7.0 6.2  HGB 11.2* 11.2*  HCT 33.2* 33.9*  PLT 304 280  MCV 84.9 86.5  MCH 28.6 28.6  MCHC 33.7 33.0  RDW 16.5* 16.4*    Chemistries  Recent Labs  Lab 03/02/19 1614 03/08/19 2328 03/09/19 0440  NA 136 130* 131*  K 3.1* 3.9 3.8  CL 94* 91* 92*  CO2 33* 27 29  GLUCOSE 146* 114* 111*  BUN 14 26* 23  CREATININE 0.85 1.46* 1.24*  CALCIUM 9.5 8.6* 8.7*  AST 21 22  --   ALT 17 23  --   ALKPHOS 56 61  --   BILITOT 1.0 0.4  --    ------------------------------------------------------------------------------------------------------------------ estimated creatinine clearance is 26.6 mL/min (A) (by C-G formula based on SCr of 1.24 mg/dL  (H)). ------------------------------------------------------------------------------------------------------------------ No results for input(s): HGBA1C in the last 72 hours. ------------------------------------------------------------------------------------------------------------------ No results for input(s): CHOL, HDL, LDLCALC, TRIG, CHOLHDL, LDLDIRECT in the last 72 hours. ------------------------------------------------------------------------------------------------------------------ No results for input(s): TSH, T4TOTAL, T3FREE, THYROIDAB in the last 72 hours.  Invalid input(s): FREET3 ------------------------------------------------------------------------------------------------------------------ No results for input(s): VITAMINB12, FOLATE, FERRITIN, TIBC, IRON, RETICCTPCT in the last 72 hours.  Coagulation profile No results for input(s): INR, PROTIME in the last 168 hours.  No results for input(s): DDIMER in the last 72 hours.  Cardiac Enzymes Recent Labs  Lab 03/08/19 2328  TROPONINI <0.03   ------------------------------------------------------------------------------------------------------------------ Invalid input(s): POCBNP    Assessment & Plan  Is 85 year old admitted with dizziness and weakness  1.  Dizziness we will try some Antivert  2.  Chronic systolic and diastolic CHF I do not feel that patient currently has acute CHF continue to monitor she has been seen by cardiology  3.  Acute kidney injury suspect this is due to Bactrim therapy creatinine is improved I do not see any need for nephrology input We will repeat renal function in the morning  4.  Recent orbital cellulitis I will place patient on Keflex  5.   Coronary artery disease continue home regimen  6.  Paroxysmal atrial fibrillation continue Eliquis and diltiazem  7.  Depression continue duloxetine      Code Status Orders  (From admission, onward)         Start     Ordered    03/09/19 0257  Full code  Continuous     03/09/19 0257        Code Status History    Date Active Date Inactive Code Status Order ID Comments User Context   08/02/2017 0709 08/05/2017 1928 Full Code [id number]  Leim Fabry, MD Inpatient   11/03/2015 1532 11/08/2015 1011 Full Code [id number]  Theodoro Grist, MD Inpatient    Advance Directive Documentation     Most Recent Value  Type of Advance Directive  Healthcare Power of Attorney, Living will  Pre-existing out of facility DNR order (yellow form or pink MOST form)  -  "MOST" Form in Place?  -  Consults cardiology  DVT Prophylaxis Eliquis  Lab Results  Component Value Date   PLT 280 03/09/2019     Time Spent in minutes  57min 11am to 1145am Greater than 50% of time spent in care coordination and counseling patient regarding the condition and plan of care.   Dustin Flock M.D on 03/09/2019 at 11:53 AM  Between 7am to 6pm - Pager - [phone]  After 6pm go to [email or web address] - Proofreader  [employer]   Office  [phone]

## 2019-03-09 NOTE — Plan of Care (Signed)
Patient was admitted at  Patient profile was completed. No complaints of pain. Patient is very weak. Patient is resting. Will continue to monitor and assess.

## 2019-03-09 NOTE — Progress Notes (Signed)
Advanced care plan.  Purpose of the Encounter: CODE STATUS  Parties in Attendance: Patient herself Patient's Decision Capacity: Intact  Subjective/Patient's story: Allana Shrestha  is a 85 y.o. female who presents with chief complaint as above.  Patient presents the ED with a complaint of 24 hours of weakness.  She states she was recently admitted at [facility] for orbital cellulitis and had I&D performed with several days of antibiotics.  She was back home for a short period of time before developing significant weakness today.     Objective/Medical story I discussed with the patient regarding her desires for cardiac and pulmonary resuscitation.  Also asked her about a living will and healthcare power of attorney   Goals of care determination:  Patient states that she would want everything to be done and wants to be a full code.  She states that she will work on healthcare power of attorney after discharge.Marland Kitchen   CODE STATUS: Full code   Time spent discussing advanced care planning: 16 minutes

## 2019-03-09 NOTE — Progress Notes (Signed)
Patient's son, Shermaine Rivet, called and spoke to this RN requesting an update. He had spoken with Care Manager earlier this afternoon regarding short term [place]. He is considering her options. Also updated him regarding her activity. She was able to work with PT today, and is currently up in the chair and tolerating well. She was given a dose of Anitvert today with some relief of dizziness. Still requires assist of 1. Has eaten poorly today, complaining of bloating. Was given TUMS earlier today. We also discussed that her kidney function has improved and we will check function again tomorrow. Diuretics are being held for now. Keflex was started for her eye cellulitis. She is not in acute CHF currently, and son was informed of this. He states he will follow up again tomorrow, and verbalized appreciation for the information.

## 2019-03-09 NOTE — H&P (Signed)
[facility] [place] NAME: Rachael Horne    MR#:  [id number]  DATE OF BIRTH:  [birth year]  DATE OF ADMISSION:  03/08/2019  PRIMARY CARE PHYSICIAN: Idelle Crouch, MD   REQUESTING/REFERRING PHYSICIAN: Owens Shark, MD  CHIEF COMPLAINT:   Chief Complaint  Patient presents with  . Weakness    HISTORY OF PRESENT ILLNESS:  Rachael Horne  is a 85 y.o. female who presents with chief complaint as above.  Patient presents the ED with a complaint of 24 hours of weakness.  She states she was recently admitted at [facility] for orbital cellulitis and had I&D performed with several days of antibiotics.  She was back home for a short period of time before developing significant weakness today.  Work-up in the ED shows acute kidney injury, and elevated BNP.  Patient does have a known history of heart failure, and has significant bilateral lower extremity edema.  Hospitalist were called for admission  PAST MEDICAL HISTORY:   Past Medical History:  Diagnosis Date  . Anxiety   . Arthritis   . Atrial fibrillation ([place])   . Breast cancer [facility]) 2013   right breast, radiation  . Breast cancer ([place]) 2007   left breast, radiation  . Cancer ([place])    BL breast  . CHF (congestive heart failure) ([place])   . Coronary artery disease   . Diabetes mellitus without complication ([place])   . DVT (deep venous thrombosis) ([place]) 2003  . Dyspnea    with exertion  . Dysrhythmia   . GERD (gastroesophageal reflux disease)   . Hyperlipemia   . Hypertension   . Personal history of radiation therapy 2013   RIGHT lumpectomy  . Stroke [facility] 2007   TIA  . Traumatic hematoma of left lower leg 07/2017     PAST SURGICAL HISTORY:   Past Surgical History:  Procedure Laterality Date  . APPENDECTOMY    . APPLICATION OF WOUND VAC Left 08/02/2017   Procedure: APPLICATION OF WOUND VAC;  Surgeon: Leim Fabry, MD;  Location: [facility] ORS;  Service: Orthopedics;  Laterality:  Left;  . BILATERAL CARPAL TUNNEL RELEASE Bilateral   . BREAST LUMPECTOMY Right 2013   RIGHT lumpectomy w/ radiation 2013  . BREAST LUMPECTOMY Left 2009   LEFT LUMPECTOMY W/ RADIATION 2009  . CARDIAC CATHETERIZATION    . CHOLECYSTECTOMY    . EYE SURGERY Bilateral    Cataract Extraction with IOL  . HERNIA REPAIR     Umbilical Hernia Repair  . I&D EXTREMITY Left 08/02/2017   Procedure: I & D LEFT LEG HEMATOMA;  Surgeon: Leim Fabry, MD;  Location: [facility] ORS;  Service: Orthopedics;  Laterality: Left;  . TONSILLECTOMY       SOCIAL HISTORY:   Social History   Tobacco Use  . Smoking status: Never Smoker  . Smokeless tobacco: Never Used  Substance Use Topics  . Alcohol use: No     FAMILY HISTORY:   Family History  Problem Relation Age of Onset  . Breast cancer Neg Hx      DRUG ALLERGIES:   Allergies  Allergen Reactions  . Amoxicillin Rash    MEDICATIONS AT HOME:   Prior to Admission medications   Medication Sig Start Date End Date Taking? Authorizing Provider  acetaminophen (TYLENOL) 500 MG tablet Take 1,000 mg by mouth 2 (two) times daily as needed for mild pain.    [provider]  albuterol (PROVENTIL HFA;VENTOLIN HFA) 108 (90 BASE) MCG/ACT  inhaler Inhale 2 puffs into the lungs every 6 (six) hours as needed for wheezing or shortness of breath.    [provider]  alendronate (FOSAMAX) 70 MG tablet TAKE ONE TABLET BY MOUTH EACH WEEK, ON AN EMPTY STOMACH BEFORE BREAKFAST WITH 8oz OF WATER AND REMAIN UPRIGHT FOR :30 07/24/18   Lloyd Huger, MD  ALPRAZolam Duanne Moron) 0.25 MG tablet Take 0.5 tablets (0.125 mg total) by mouth at bedtime as needed for anxiety or sleep. Patient not taking: Reported on 03/02/2019 11/07/15   Aldean Jewett, MD  anastrozole (ARIMIDEX) 1 MG tablet TAKE ONE (1) TABLET BY MOUTH EVERY DAY 04/10/17   Lloyd Huger, MD  apixaban (ELIQUIS) 5 MG TABS tablet Take 5 mg by mouth 2 (two) times daily.    [provider]   budesonide-formoterol (SYMBICORT) 160-4.5 MCG/ACT inhaler Inhale 2 puffs into the lungs daily.     [provider]  CALCIUM-VITAMIN D-VITAMIN K PO Take 1 tablet by mouth 2 (two) times daily.    [provider]  cyclobenzaprine (FLEXERIL) 10 MG tablet Take 0.5 tablets (5 mg total) by mouth at bedtime. 01/20/17   Merlyn Lot, MD  desloratadine (CLARINEX) 5 MG tablet Take by mouth. 02/17/18 02/17/19  [provider]  diltiazem (DILACOR XR) 240 MG 24 hr capsule Take 240 mg by mouth daily.    [provider]  diphenhydramine-acetaminophen (TYLENOL PM) 25-500 MG TABS Take 1 tablet by mouth at bedtime as needed (for sleep).    [provider]  DULoxetine (CYMBALTA) 30 MG capsule  12/18/17   [provider]  fluticasone (FLONASE) 50 MCG/ACT nasal spray Place 2 sprays into both nostrils as needed.     [provider]  furosemide (LASIX) 40 MG tablet Take 40 mg by mouth 2 (two) times daily.     [provider]  gabapentin (NEURONTIN) 100 MG capsule  02/05/18   [provider]  glimepiride (AMARYL) 4 MG tablet Take 4 mg by mouth daily.    [provider]  GLUCOSAMINE-CHONDROITIN PO Take 1 tablet by mouth daily.    [provider]  glucose blood test strip TEST BLOOD SUGAR DAILY AS DIRECTED 03/05/17   [provider]  loratadine (CLARITIN) 10 MG tablet Take 10 mg by mouth daily as needed for allergies.    [provider]  losartan (COZAAR) 50 MG tablet Take 25 mg by mouth daily.     [provider]  lovastatin (MEVACOR) 40 MG tablet Take 40 mg by mouth at bedtime.    [provider]  magnesium oxide (MAG-OX) 400 MG tablet Take 400 mg by mouth daily at 12 noon.    [provider]  metolazone (ZAROXOLYN) 2.5 MG tablet Take 2.5 mg by mouth 3 (three) times a week. Mon. Wed. Fri    [provider]  montelukast (SINGULAIR) 10 MG tablet Take 10 mg by mouth at  bedtime.    [provider]  Multiple Vitamin (MULTIVITAMIN WITH MINERALS) TABS tablet Take 1 tablet by mouth daily at 12 noon.    [provider]  pantoprazole (PROTONIX) 40 MG tablet Take 40 mg by mouth as needed.     [provider]  potassium chloride (K-DUR) 10 MEQ tablet Take 10 mEq by mouth daily at 12 noon.    [provider]  potassium chloride SA (KLOR-CON M20) 20 MEQ tablet Take 1 tablet (20 mEq total) by mouth daily. 09/01/17   Hinda Kehr, MD  senna-docusate (SENOKOT-S)  8.6-50 MG tablet Take 1 tablet by mouth 2 (two) times daily as needed for mild constipation or moderate constipation. 11/07/15   Aldean Jewett, MD  sodium chloride (OCEAN) 0.65 % SOLN nasal spray Place 1 spray into both nostrils as needed for congestion. 11/07/15   Aldean Jewett, MD  traMADol Veatrice Bourbon) 50 MG tablet Take 1-2 tablets by mouth every 6 hours as needed for moderate to severe pain 09/01/17   Hinda Kehr, MD    REVIEW OF SYSTEMS:  Review of Systems  Constitutional: Negative for chills, fever, malaise/fatigue and weight loss.  HENT: Negative for ear pain, hearing loss and tinnitus.   Eyes: Negative for blurred vision, double vision, pain and redness.  Respiratory: Negative for cough, hemoptysis and shortness of breath.   Cardiovascular: Positive for leg swelling. Negative for chest pain, palpitations and orthopnea.  Gastrointestinal: Negative for abdominal pain, constipation, diarrhea, nausea and vomiting.  Genitourinary: Negative for dysuria, frequency and hematuria.  Musculoskeletal: Negative for back pain, joint pain and neck pain.  Skin:       No acne, rash, or lesions  Neurological: Positive for weakness. Negative for dizziness, tremors and focal weakness.  Endo/Heme/Allergies: Negative for polydipsia. Does not bruise/bleed easily.  Psychiatric/Behavioral: Negative for depression. The patient is not nervous/anxious and does not have insomnia.      VITAL  SIGNS:   Vitals:   03/08/19 2259 03/08/19 2305 03/08/19 2330 03/09/19 0000  BP: (!) 189/62 (!) 189/62 (!) 166/54 (!) 187/66  Pulse: 76 74 78 77  Resp: 16 19 17 15   Temp: 98.6 F (37 C)     TempSrc: Oral     SpO2: 94% 91% 91% 94%   Wt Readings from Last 3 Encounters:  03/02/19 61.2 kg  05/24/18 64.4 kg  03/11/18 63.1 kg    PHYSICAL EXAMINATION:  Physical Exam  Vitals reviewed. Constitutional: She is oriented to person, place, and time. She appears well-developed and well-nourished. No distress.  HENT:  Head: Normocephalic and atraumatic.  Mouth/Throat: Oropharynx is clear and moist.  Eyes: Pupils are equal, round, and reactive to light. Conjunctivae and EOM are normal. No scleral icterus.  Neck: Normal range of motion. Neck supple. No JVD present. No thyromegaly present.  Cardiovascular: Normal rate, regular rhythm and intact distal pulses. Exam reveals no gallop and no friction rub.  No murmur heard. Respiratory: Effort normal and breath sounds normal. No respiratory distress. She has no wheezes. She has no rales.  GI: Soft. Bowel sounds are normal. She exhibits no distension. There is no abdominal tenderness.  Musculoskeletal: Normal range of motion.        General: Edema (Bilateral lower extremities) present.     Comments: No arthritis, no gout  Lymphadenopathy:    She has no cervical adenopathy.  Neurological: She is alert and oriented to person, place, and time. No cranial nerve deficit.  No dysarthria, no aphasia  Skin: Skin is warm and dry. No rash noted. No erythema.  Psychiatric: She has a normal mood and affect. Her behavior is normal. Judgment and thought content normal.    LABORATORY PANEL:   CBC Recent Labs  Lab 03/08/19 2328  WBC 7.0  HGB 11.2*  HCT 33.2*  PLT 304   ------------------------------------------------------------------------------------------------------------------  Chemistries  Recent Labs  Lab 03/08/19 2328  NA 130*  K 3.9  CL  91*  CO2 27  GLUCOSE 114*  BUN 26*  CREATININE 1.46*  CALCIUM 8.6*  AST 22  ALT 23  ALKPHOS 61  BILITOT 0.4   ------------------------------------------------------------------------------------------------------------------  Cardiac Enzymes Recent Labs  Lab 03/08/19 2328  TROPONINI <0.03   ------------------------------------------------------------------------------------------------------------------  RADIOLOGY:  Dg Chest Portable 1 View  Result Date: 03/08/2019 CLINICAL DATA:  Hypoxia. Weakness. EXAM: PORTABLE CHEST 1 VIEW COMPARISON:  Report from chest radiograph 12/23/2018, images not available. Radiograph 11/05/2015 FINDINGS: Cardiomegaly is unchanged. Unchanged mediastinal contours. Interstitial/bronchial coarsening, chronic and unchanged. No acute airspace disease. No evidence of pulmonary edema, pleural effusion or pneumothorax. No acute osseous abnormalities are seen. IMPRESSION: Stable cardiomegaly and chronic interstitial coarsening. Electronically Signed   By: Keith Rake M.D.   On: 03/08/2019 23:50    EKG:   Orders placed or performed during the hospital encounter of 01/20/17  . EKG 12-Lead  . EKG 12-Lead  . ED EKG 12-Lead  . ED EKG 12-Lead    IMPRESSION AND PLAN:  Principal Problem:   Acute on chronic combined systolic and diastolic CHF (congestive heart failure) ([place]) -unclear etiology for acute exacerbation though it may be related to the stress of her recent cellulitis and hospital stay.  Her BNP is mildly elevated, and she has significant bilateral lower extremity pitting edema.  We have ordered an echocardiogram and a cardiology consult Active Problems:   AKI -I will hold off on her diuresis for now until further recommendation from cardiology can be given.  I will also hold off on any fluid administration at this time given her exacerbation of heart failure as above.  Nephrology consult could be considered pending cardiology's recommendations or any  further deterioration in her renal function.  Avoid nephrotoxins   CAD (coronary artery disease) -continue home meds, other work-up as above   PAF -continue home meds including anticoagulation   Diabetes mellitus type 2, uncomplicated (HCC) -sliding scale insulin coverage   Hypertension -home dose antihypertensives   HLD (hyperlipidemia) -home dose antilipid  Chart review performed and case discussed with ED provider. Labs, imaging and/or ECG reviewed by provider and discussed with patient/family. Management plans discussed with the patient and/or family.  DVT PROPHYLAXIS: Systemic anticoagulation   GI PROPHYLAXIS:  PPI   ADMISSION STATUS: Observation  CODE STATUS: Full Code Status History    Date Active Date Inactive Code Status Order ID Comments User Context   08/02/2017 0709 08/05/2017 1928 Full Code [id number]  Leim Fabry, MD Inpatient   11/03/2015 1532 11/08/2015 1011 Full Code [id number]  Theodoro Grist, MD Inpatient      TOTAL TIME TAKING CARE OF THIS PATIENT: 40 minutes.   Ethlyn Daniels 03/09/2019, 1:48 AM  [facility]  Office  [phone]  CC: Primary care physician; Idelle Crouch, MD  Note:  This document was prepared using Dragon voice recognition software and may include unintentional dictation errors.

## 2019-03-09 NOTE — Evaluation (Signed)
Physical Therapy Evaluation Patient Details Name: Rachael Horne MRN: [id number] DOB: November 14, 1928 Today's Date: 03/09/2019   History of Present Illness  Patient presents the ED with a complaint of 24 hours of weakness.  She states she was recently admitted at [facility] for orbital cellulitis and had I&D performed with several days of antibiotics.  She was back home for a short period of time before developing significant weakness today.  Work-up in the ED shows acute kidney injury, and elevated BNP. PMH of DM, HLD, HTN, afib, breast cancer, DVT, TIA.     Clinical Impression  Patient alert, oriented, agreeable to PT. Behavior WFLs, though anxious with mobility. Pt reported that she lives alone, uses a rollator for household mobility, relies on son for grocery shopping, errands, etc. Son works part time, so is available PRN.   Patient demonstrated rolling in bed with minAx1 for bedpan use. Supine to sit with minAx1, able to sit EOB. Progressed from [place] to maintain balance to CGA with bilateral feet supported and UE support. Initially pt complained of dizziness, decreased with time. Orthostatic vitals assessed, see vitals flowsheet for details and RN aware. Sit <> stand x2 this session, minA with RW. Second attempt patient able to take a few steps to chair at bedside with RW and minA. BP at end of session 174/56, pt in NAD.  Overall the patient demonstrated deficits (see "PT Problem List") that impede the patient's functional abilities, safety, and mobility and would benefit from skilled PT intervention. Recommendation is STR due to current level of assistance needed and decreased caregiver support.      Follow Up Recommendations SNF    Equipment Recommendations  Rolling walker with 5" wheels    Recommendations for Other Services       Precautions / Restrictions Precautions Precautions: Fall Restrictions Weight Bearing Restrictions: No      Mobility  Bed Mobility Overal bed  mobility: Needs Assistance Bed Mobility: Supine to Sit     Supine to sit: Min assist        Transfers Overall transfer level: Needs assistance Equipment used: Rolling walker (2 wheeled) Transfers: Sit to/from Stand Sit to Stand: Min assist;From elevated surface            Ambulation/Gait Ambulation/Gait assistance: Min Web designer (Feet): 2 Feet Assistive device: Rolling walker (2 wheeled)       General Gait Details: Pt able to take a few steps to turn to the chair. RW and minA for unsteadiness throughout.  Stairs            Wheelchair Mobility    Modified Rankin (Stroke Patients Only)       Balance Overall balance assessment: Needs assistance   Sitting balance-Leahy Scale: Fair Sitting balance - Comments: Progressed from modA to -CGA for seated balance, improvement with feet supported, UE supported                                     Pertinent Vitals/Pain Pain Assessment: No/denies pain    Home Living Family/patient expects to be discharged to:: Private residence Living Arrangements: Alone Available Help at Discharge: Family;Available PRN/intermittently Type of Home: Apartment Home Access: Level entry     Home Layout: One level Home Equipment: Walker - 4 wheels;Shower seat - built in;Cane - single point;Bedside commode;Grab bars - tub/shower      Prior Function Level of Independence: Needs assistance   Gait /  Transfers Assistance Needed: Pt endorsed using rollator for household ambulation  ADL's / Homemaking Assistance Needed: Relies on son for grocery shopping, errands, can cook/clean        Hand Dominance        Extremity/Trunk Assessment   Upper Extremity Assessment Upper Extremity Assessment: Generalized weakness    Lower Extremity Assessment Lower Extremity Assessment: Generalized weakness    Cervical / Trunk Assessment Cervical / Trunk Assessment: Normal  Communication   Communication: No  difficulties  Cognition Arousal/Alertness: Awake/alert Behavior During Therapy: WFL for tasks assessed/performed Overall Cognitive Status: Within Functional Limits for tasks assessed                                        General Comments      Exercises Other Exercises Other Exercises: Orthostatic vitals assessed throughout mobility, multiple readings taken in sitting, RN aware of low diastolic reading.   Assessment/Plan    PT Assessment Patient needs continued PT services  PT Problem List Decreased strength;Cardiopulmonary status limiting activity;Decreased range of motion;Decreased activity tolerance;Decreased balance;Decreased mobility;Decreased safety awareness;Decreased knowledge of use of DME       PT Treatment Interventions DME instruction;Therapeutic exercise;Gait training;Balance training;Stair training;Neuromuscular re-education;Functional mobility training;Therapeutic activities;Patient/family education    PT Goals (Current goals can be found in the Care Plan section)  Acute Rehab PT Goals Patient Stated Goal: to increase strength PT Goal Formulation: With patient Time For Goal Achievement: 03/23/19 Potential to Achieve Goals: Good    Frequency Min 2X/week   Barriers to discharge Decreased caregiver support      Co-evaluation               AM-PAC PT "6 Clicks" Mobility  Outcome Measure Help needed turning from your back to your side while in a flat bed without using bedrails?: A Little Help needed moving from lying on your back to sitting on the side of a flat bed without using bedrails?: A Lot Help needed moving to and from a bed to a chair (including a wheelchair)?: A Lot Help needed standing up from a chair using your arms (e.g., wheelchair or bedside chair)?: A Little Help needed to walk in hospital room?: A Lot Help needed climbing 3-5 steps with a railing? : A Lot 6 Click Score: 14    End of Session Equipment Utilized During  Treatment: Gait belt Activity Tolerance: Patient limited by fatigue Patient left: in chair;with chair alarm set;with call bell/phone within reach Nurse Communication: Mobility status;Other (comment)(BP) PT Visit Diagnosis: Other abnormalities of gait and mobility (R26.89);Muscle weakness (generalized) (M62.81);Difficulty in walking, not elsewhere classified (R26.2)    Time: 1343-1430 PT Time Calculation (min) (ACUTE ONLY): 47 min   Charges:   PT Evaluation $PT Eval Moderate Complexity: 1 Mod PT Treatments $Therapeutic Activity: 23-37 mins        Lieutenant Diego PT, DPT 2:47 PM,03/09/19 [phone]

## 2019-03-09 NOTE — ED Notes (Signed)
Pt attempted to use the bedpan but was unable to provide sample, 2 assist up to the bedside commode, pt is very weak during transfer. Sample collected and tubed to the lab.

## 2019-03-09 NOTE — Progress Notes (Signed)
PHARMACY NOTE:  ANTIMICROBIAL RENAL DOSAGE ADJUSTMENT  Current antimicrobial regimen includes a mismatch between antimicrobial dosage and estimated renal function.  As per policy approved by the Pharmacy & Therapeutics and Medical Executive Committees, the antimicrobial dosage will be adjusted accordingly.  Current antimicrobial dosage:  Cephalexin 500 mg Q8H  Indication: Orbital cellulitis   Renal Function:  Estimated Creatinine Clearance: 26.6 mL/min (A) (by C-G formula based on SCr of 1.24 mg/dL (H)).     Antimicrobial dosage has been changed to:  Cephalexin 250 mg Q8H  Additional comments: Will continue to monitor renal function.    Thank you for allowing pharmacy to be a part of this patient's care.  Rowland Lathe, [facility] 03/09/2019 4:08 PM

## 2019-03-09 NOTE — Care Management Obs Status (Signed)
[facility] NOTIFICATION   Patient Details  Name: Rachael Horne MRN: [id number] Date of Birth: 08-10-1928   Medicare Observation Status Notification Given:  Yes    Ross Ludwig, LCSW 03/09/2019, 5:00 PM

## 2019-03-10 DIAGNOSIS — Z7984 Long term (current) use of oral hypoglycemic drugs: Secondary | ICD-10-CM | POA: Diagnosis not present

## 2019-03-10 DIAGNOSIS — Z7951 Long term (current) use of inhaled steroids: Secondary | ICD-10-CM | POA: Diagnosis not present

## 2019-03-10 DIAGNOSIS — Z8673 Personal history of transient ischemic attack (TIA), and cerebral infarction without residual deficits: Secondary | ICD-10-CM | POA: Diagnosis not present

## 2019-03-10 DIAGNOSIS — N179 Acute kidney failure, unspecified: Secondary | ICD-10-CM | POA: Diagnosis present

## 2019-03-10 DIAGNOSIS — H05019 Cellulitis of unspecified orbit: Secondary | ICD-10-CM | POA: Diagnosis present

## 2019-03-10 DIAGNOSIS — K219 Gastro-esophageal reflux disease without esophagitis: Secondary | ICD-10-CM | POA: Diagnosis present

## 2019-03-10 DIAGNOSIS — Z7901 Long term (current) use of anticoagulants: Secondary | ICD-10-CM | POA: Diagnosis not present

## 2019-03-10 DIAGNOSIS — F419 Anxiety disorder, unspecified: Secondary | ICD-10-CM | POA: Diagnosis present

## 2019-03-10 DIAGNOSIS — Z86718 Personal history of other venous thrombosis and embolism: Secondary | ICD-10-CM | POA: Diagnosis not present

## 2019-03-10 DIAGNOSIS — I110 Hypertensive heart disease with heart failure: Secondary | ICD-10-CM | POA: Diagnosis present

## 2019-03-10 DIAGNOSIS — Z923 Personal history of irradiation: Secondary | ICD-10-CM | POA: Diagnosis not present

## 2019-03-10 DIAGNOSIS — I2510 Atherosclerotic heart disease of native coronary artery without angina pectoris: Secondary | ICD-10-CM | POA: Diagnosis present

## 2019-03-10 DIAGNOSIS — R531 Weakness: Secondary | ICD-10-CM | POA: Diagnosis present

## 2019-03-10 DIAGNOSIS — E785 Hyperlipidemia, unspecified: Secondary | ICD-10-CM | POA: Diagnosis present

## 2019-03-10 DIAGNOSIS — I5043 Acute on chronic combined systolic (congestive) and diastolic (congestive) heart failure: Secondary | ICD-10-CM | POA: Diagnosis present

## 2019-03-10 DIAGNOSIS — T368X5A Adverse effect of other systemic antibiotics, initial encounter: Secondary | ICD-10-CM | POA: Diagnosis present

## 2019-03-10 DIAGNOSIS — F329 Major depressive disorder, single episode, unspecified: Secondary | ICD-10-CM | POA: Diagnosis present

## 2019-03-10 DIAGNOSIS — E119 Type 2 diabetes mellitus without complications: Secondary | ICD-10-CM | POA: Diagnosis present

## 2019-03-10 DIAGNOSIS — I480 Paroxysmal atrial fibrillation: Secondary | ICD-10-CM | POA: Diagnosis present

## 2019-03-10 DIAGNOSIS — Z79811 Long term (current) use of aromatase inhibitors: Secondary | ICD-10-CM | POA: Diagnosis not present

## 2019-03-10 DIAGNOSIS — Z853 Personal history of malignant neoplasm of breast: Secondary | ICD-10-CM | POA: Diagnosis not present

## 2019-03-10 DIAGNOSIS — Z79899 Other long term (current) drug therapy: Secondary | ICD-10-CM | POA: Diagnosis not present

## 2019-03-10 LAB — BASIC METABOLIC PANEL
Anion gap: 12 (ref 5–15)
BUN: 19 mg/dL (ref 8–23)
CO2: 25 mmol/L (ref 22–32)
Calcium: 8.9 mg/dL (ref 8.9–10.3)
Chloride: 94 mmol/L — ABNORMAL LOW (ref 98–111)
Creatinine, Ser: 1.17 mg/dL — ABNORMAL HIGH (ref 0.44–1.00)
GFR calc Af Amer: 48 mL/min — ABNORMAL LOW (ref >60)
GFR calc non Af Amer: 41 mL/min — ABNORMAL LOW (ref >60)
Glucose, Bld: 103 mg/dL — ABNORMAL HIGH (ref 70–99)
Potassium: 3.7 mmol/L (ref 3.5–5.1)
Sodium: 131 mmol/L — ABNORMAL LOW (ref 135–145)

## 2019-03-10 LAB — GLUCOSE, CAPILLARY
Glucose-Capillary: 97 mg/dL (ref 70–99)
Glucose-Capillary: 132 mg/dL — ABNORMAL HIGH (ref 70–99)
Glucose-Capillary: 149 mg/dL — ABNORMAL HIGH (ref 70–99)
Glucose-Capillary: 134 mg/dL — ABNORMAL HIGH (ref 70–99)

## 2019-03-10 MED ORDER — HYDRALAZINE HCL 25 MG PO TABS
25.0000 mg | ORAL_TABLET | Freq: Three times a day (TID) | ORAL | Status: DC
  Administered 2019-03-10 – 2019-03-11 (×3): 25 mg via ORAL
  Filled 2019-03-10 (×3): qty 1

## 2019-03-10 MED ORDER — ALPRAZOLAM 0.25 MG PO TABS
0.2500 mg | ORAL_TABLET | Freq: Every evening | ORAL | Status: DC | PRN
  Administered 2019-03-10: 0.25 mg via ORAL
  Filled 2019-03-10: qty 1

## 2019-03-10 MED ORDER — APIXABAN 2.5 MG PO TABS
2.5000 mg | ORAL_TABLET | Freq: Two times a day (BID) | ORAL | Status: DC
  Administered 2019-03-10 – 2019-03-11 (×3): 2.5 mg via ORAL
  Filled 2019-03-10 (×2): qty 1

## 2019-03-10 MED ORDER — FUROSEMIDE 40 MG PO TABS
40.0000 mg | ORAL_TABLET | Freq: Two times a day (BID) | ORAL | Status: DC
  Administered 2019-03-11: 40 mg via ORAL
  Filled 2019-03-10 (×2): qty 1

## 2019-03-10 NOTE — TOC Progression Note (Signed)
Transition of Care [facility]) - Progression Note    Patient Details  Name: Rachael Horne MRN: [id number] Date of Birth: 1928-09-21  [facility] CM/SW Contact  Ross Ludwig,  Phone Number: 03/10/2019, 6:35 PM  Clinical Narrative:     CSW presented bed offers to patient, and she chose [facility] for short term rehab.  CSW attempted to get a hold of patient's son, left a message awaiting for a call back.  CSW updated [facility], they have received insurance auth for patient to go to SNF once she is medically ready for discharge.    Expected Discharge Plan: [place] Barriers to Discharge: Continued Medical Work up  Expected Discharge Plan and Services Expected Discharge Plan: [place] In-house Referral: Clinical Social Work   Post Acute Care Choice: [place] Living arrangements for the past 2 months: Single Family Home                 DME Arranged: N/A         Social Determinants of Health (SDOH) Interventions    Readmission Risk Interventions No flowsheet data found.

## 2019-03-10 NOTE — Progress Notes (Signed)
Physical Therapy Treatment Patient Details Name: Rachael Horne MRN: [id number] DOB: 11-13-1928 Today's Date: 03/10/2019    History of Present Illness Patient presents the ED with a complaint of 24 hours of weakness.  She states she was recently admitted at [facility] for orbital cellulitis and had I&D performed with several days of antibiotics.  She was back home for a short period of time before developing significant weakness today.  Work-up in the ED shows acute kidney injury, and elevated BNP. PMH of DM, HLD, HTN, afib, breast cancer, DVT, TIA.    PT Comments    Pt received in bed, agreeable to PT treatment. Pt mobilizing without physical assist this date, but does continue to require close supervision for safety. Pt dizzy upon sitting up which resolves after a few minutes. After first AMB bout, pt then becomes dizzy nauseated.BP established, shown to be quite high 190s/70s mmHg, MD made aware. Pt motivated to AMB further, but additional activity is held at this time. Pt progressing well toward goals, but limited by vitals this date.    Follow Up Recommendations  Home health PT     Equipment Recommendations  Rolling walker with 5" wheels    Recommendations for Other Services       Precautions / Restrictions Precautions Precautions: Fall Restrictions Weight Bearing Restrictions: No    Mobility  Bed Mobility Overal bed mobility: Needs Assistance Bed Mobility: Supine to Sit     Supine to sit: Modified independent (Device/Increase time)        Transfers Overall transfer level: Needs assistance Equipment used: Rolling walker (2 wheeled) Transfers: Sit to/from Stand Sit to Stand: Min guard            Ambulation/Gait Ambulation/Gait assistance: Min guard Gait Distance (Feet): 100 Feet Assistive device: Rolling walker (2 wheeled) Gait Pattern/deviations: WFL(Within Functional Limits) Gait velocity: 0.96m/s   General Gait Details: slow, legs get  tired   Stairs             Wheelchair Mobility    Modified Rankin (Stroke Patients Only)       Balance     Sitting balance-Leahy Scale: Fair Sitting balance - Comments: Progressed from modA to -CGA for seated balance, improvement with feet supported, UE supported   Standing balance support: During functional activity Standing balance-Leahy Scale: Fair                              Cognition Arousal/Alertness: Awake/alert Behavior During Therapy: WFL for tasks assessed/performed Overall Cognitive Status: Within Functional Limits for tasks assessed                                        Exercises      General Comments        Pertinent Vitals/Pain Pain Assessment: No/denies pain    Home Living                      Prior Function            PT Goals (current goals can now be found in the care plan section) Acute Rehab PT Goals Patient Stated Goal: to increase strength PT Goal Formulation: With patient Time For Goal Achievement: 03/23/19 Potential to Achieve Goals: Good Progress towards PT goals: Progressing toward goals    Frequency    Min 2X/week  PT Plan Discharge plan needs to be updated    Co-evaluation              AM-PAC PT "6 Clicks" Mobility   Outcome Measure  Help needed turning from your back to your side while in a flat bed without using bedrails?: None Help needed moving from lying on your back to sitting on the side of a flat bed without using bedrails?: A Little Help needed moving to and from a bed to a chair (including a wheelchair)?: A Little Help needed standing up from a chair using your arms (e.g., wheelchair or bedside chair)?: A Little Help needed to walk in hospital room?: A Little Help needed climbing 3-5 steps with a railing? : A Lot 6 Click Score: 18    End of Session Equipment Utilized During Treatment: Gait belt Activity Tolerance: Patient limited by fatigue(nausea,  HTN) Patient left: in chair;with chair alarm set;with call bell/phone within reach Nurse Communication: Mobility status;Other (comment)(contacted MD 2/2 HTN) PT Visit Diagnosis: Other abnormalities of gait and mobility (R26.89);Muscle weakness (generalized) (M62.81);Difficulty in walking, not elsewhere classified (R26.2)     Time: 0300-9233 PT Time Calculation (min) (ACUTE ONLY): 30 min  Charges:  $Therapeutic Activity: 23-37 mins                    12:36 PM, 03/10/19 Etta Grandchild, PT, DPT Physical Therapist - [facility]  [phone] ([place])     Buccola,Allan C 03/10/2019, 12:24 PM

## 2019-03-10 NOTE — Progress Notes (Signed)
[facility]  SUBJECTIVE: The patient reports feeling better this morning. She denies chest pain or shortness of breath, and reports improvement in her peripheral edema. She has known lower extremity weakness.   Vitals:   03/09/19 1535 03/09/19 1936 03/10/19 0416 03/10/19 0723  BP: (!) 170/50 (!) 169/59 (!) 143/58 (!) 189/74  Pulse: 77 66 68 83  Resp:  19 18   Temp: 98.2 F (36.8 C) 98.6 F (37 C) 98 F (36.7 C) 98.8 F (37.1 C)  TempSrc: Oral Oral Oral Oral  SpO2: 98% 99% 100% 98%  Weight:   60.5 kg   Height:         Intake/Output Summary (Last 24 hours) at 03/10/2019 0835 Last data filed at 03/10/2019 0400 Gross per 24 hour  Intake 280 ml  Output 1475 ml  Net -1195 ml      PHYSICAL EXAM  General: Well developed, well nourished, in no acute distress, sitting up in recliner, just finished eating breakfast HEENT:  Normocephalic and atramatic Neck:  No JVD.  Lungs: normal effort of breathing on room air. Heart: irregularly irregular . Normal S1 and S2 without gallops or murmurs.  Abdomen: nondistended Msk:  Back normal, gait not assessed. Extremities: no cyanosis. 1+ bilateral pitting edema, improved Neuro: Alert and oriented X 3. Psych:  Good affect, responds appropriately   LABS: Basic Metabolic Panel: Recent Labs    03/09/19 0440 03/10/19 0300  NA 131* 131*  K 3.8 3.7  CL 92* 94*  CO2 29 25  GLUCOSE 111* 103*  BUN 23 19  CREATININE 1.24* 1.17*  CALCIUM 8.7* 8.9   Liver Function Tests: Recent Labs    03/08/19 2328  AST 22  ALT 23  ALKPHOS 61  BILITOT 0.4  PROT 6.7  ALBUMIN 3.6   No results for input(s): LIPASE, AMYLASE in the last 72 hours. CBC: Recent Labs    03/08/19 2328 03/09/19 0440  WBC 7.0 6.2  HGB 11.2* 11.2*  HCT 33.2* 33.9*  MCV 84.9 86.5  PLT 304 280   Cardiac Enzymes: Recent Labs    03/08/19 2328  TROPONINI <0.03   BNP: Invalid input(s): POCBNP D-Dimer: No results for input(s): DDIMER in the last 72  hours. Hemoglobin A1C: No results for input(s): HGBA1C in the last 72 hours. Fasting Lipid Panel: No results for input(s): CHOL, HDL, LDLCALC, TRIG, CHOLHDL, LDLDIRECT in the last 72 hours. Thyroid Function Tests: No results for input(s): TSH, T4TOTAL, T3FREE, THYROIDAB in the last 72 hours.  Invalid input(s): FREET3 Anemia Panel: No results for input(s): VITAMINB12, FOLATE, FERRITIN, TIBC, IRON, RETICCTPCT in the last 72 hours.  Dg Chest Portable 1 View  Result Date: 03/08/2019 CLINICAL DATA:  Hypoxia. Weakness. EXAM: PORTABLE CHEST 1 VIEW COMPARISON:  Report from chest radiograph 12/23/2018, images not available. Radiograph 11/05/2015 FINDINGS: Cardiomegaly is unchanged. Unchanged mediastinal contours. Interstitial/bronchial coarsening, chronic and unchanged. No acute airspace disease. No evidence of pulmonary edema, pleural effusion or pneumothorax. No acute osseous abnormalities are seen. IMPRESSION: Stable cardiomegaly and chronic interstitial coarsening. Electronically Signed   By: Keith Rake M.D.   On: 03/08/2019 23:50     Echo LVEF 60-65%  TELEMETRY: atrial fibrillation, rate 69 bpm  ASSESSMENT AND PLAN:  Principal Problem:   Acute on chronic combined systolic and diastolic CHF (congestive heart failure) (HCC) Active Problems:   CAD (coronary artery disease)   Diabetes mellitus type 2, uncomplicated (HCC)   HLD (hyperlipidemia)   Hypertension    1. Chronic diastolic CHF 2. AKI, likely  secondary to Bactrim therapy, improved today with creatinine 1.17 and BUN 19 3. Paroxysmal atrial fibrillation with chads vasc score of 7, on Eliquis for stroke prevention, rate controlled on Cardizem CD 240 mg  Recommendations: 1. Agree with overall therapy 2. Continue Eliquis with reduced dose (age >80 years, weight 60 kg) for stroke prevention 3. Resume furosemide 40 mg BID, hold metolazone for now. 4. Follow-up with Dr. Saralyn Pilar in 1 week  Sign off for now; call with any  questions.    Clabe Seal, PA-C 03/10/2019 8:35 AM

## 2019-03-10 NOTE — Progress Notes (Signed)
[facility]                                                                                                                                                                                  Patient Demographics   Rachael Horne, is a 85 y.o. female, DOB - 1928/07/06, ZYS:[id number]  Admit date - 03/08/2019   Admitting Physician Lance Coon, MD  Outpatient Primary MD for the patient is Idelle Crouch, MD   LOS - 0  Subjective: Patient got very nauseated and had change in blood pressure with PT When I saw her earlier she was doing better  Review of Systems:   CONSTITUTIONAL: No documented fever. No fatigue, weakness. No weight gain, no weight loss.  EYES: No blurry or double vision.  ENT: No tinnitus. No postnasal drip. No redness of the oropharynx.  RESPIRATORY: No cough, no wheeze, no hemoptysis. No dyspnea.  CARDIOVASCULAR: No chest pain. No orthopnea. No palpitations. No syncope.  GASTROINTESTINAL: No nausea, no vomiting or diarrhea. No abdominal pain. No melena or hematochezia.  GENITOURINARY: No dysuria or hematuria.  ENDOCRINE: No polyuria or nocturia. No heat or cold intolerance.  HEMATOLOGY: No anemia. No bruising. No bleeding.  INTEGUMENTARY: No rashes. No lesions.  MUSCULOSKELETAL: No arthritis. No swelling. No gout.  NEUROLOGIC: Patient feels dizziness PSYCHIATRIC: No anxiety. No insomnia. No ADD.    Vitals:   Vitals:   03/09/19 1936 03/10/19 0416 03/10/19 0723 03/10/19 1056  BP: (!) 169/59 (!) 143/58 (!) 189/74 (!) 194/75  Pulse: 66 68 83 77  Resp: 19 18    Temp: 98.6 F (37 C) 98 F (36.7 C) 98.8 F (37.1 C)   TempSrc: Oral Oral Oral   SpO2: 99% 100% 98%   Weight:  60.5 kg    Height:        Wt Readings from Last 3 Encounters:  03/10/19 60.5 kg  03/02/19 61.2 kg  05/24/18 64.4 kg     Intake/Output Summary (Last 24 hours) at 03/10/2019 1137 Last data filed at 03/10/2019 1126 Gross per 24 hour  Intake 520 ml   Output 1525 ml  Net -1005 ml    Physical Exam:   GENERAL: Pleasant-appearing in no apparent distress.  HEAD, EYES, EARS, NOSE AND THROAT: Atraumatic, normocephalic. Extraocular muscles are intact. Pupils equal and reactive to light. Sclerae anicteric. No conjunctival injection. No oro-pharyngeal erythema.  NECK: Supple. There is no jugular venous distention. No bruits, no lymphadenopathy, no thyromegaly.  HEART: Regular rate and rhythm,. No murmurs, no rubs, no clicks.  LUNGS: Clear to auscultation bilaterally. No rales or rhonchi. No wheezes.  ABDOMEN: Soft, flat, nontender, nondistended. Has good bowel  sounds. No hepatosplenomegaly appreciated.  EXTREMITIES: No evidence of any cyanosis, clubbing, or peripheral edema.  +2 pedal and radial pulses bilaterally.  NEUROLOGIC: The patient is alert, awake, and oriented x3 with no focal motor or sensory deficits appreciated bilaterally.  SKIN: Moist and warm with no rashes appreciated.  Psych: Not anxious, depressed LN: No inguinal LN enlargement    Antibiotics   Anti-infectives (From admission, onward)   Start     Dose/Rate Route Frequency Ordered Stop   03/09/19 2200  cephALEXin (KEFLEX) capsule 250 mg     250 mg Oral Every 8 hours 03/09/19 1608     03/09/19 1400  cephALEXin (KEFLEX) capsule 500 mg  Status:  Discontinued     500 mg Oral Every 8 hours 03/09/19 1201 03/09/19 1608      Medications   Scheduled Meds: . anastrozole  1 mg Oral Daily  . apixaban  2.5 mg Oral BID  . calcium carbonate  1 tablet Oral BID  . cephALEXin  250 mg Oral Q8H  . diltiazem  240 mg Oral Daily  . DULoxetine  30 mg Oral Daily  . furosemide  40 mg Oral BID  . hydrALAZINE  25 mg Oral Q8H  . insulin aspart  0-5 Units Subcutaneous QHS  . insulin aspart  0-9 Units Subcutaneous TID WC  . mometasone-formoterol  2 puff Inhalation BID  . montelukast  10 mg Oral QHS  . pravastatin  40 mg Oral q1800  . sodium chloride flush  10 mL Intravenous Q12H    Continuous Infusions: PRN Meds:.acetaminophen **OR** acetaminophen, albuterol, meclizine, metoprolol tartrate, ondansetron **OR** ondansetron (ZOFRAN) IV, pantoprazole   Data Review:   Micro Results No results found for this or any previous visit (from the past 240 hour(s)).  Radiology Reports Ct Head Wo Contrast  Result Date: 03/02/2019 CLINICAL DATA:  Right eye pain and swelling for 3 days EXAM: CT HEAD WITHOUT CONTRAST TECHNIQUE: Contiguous axial images were obtained from the base of the skull through the vertex without intravenous contrast. COMPARISON:  05/24/2018 FINDINGS: Brain: Stable brain atrophy pattern and chronic white matter microvascular changes. Remote right frontal subcortical white matter infarct again noted. No acute intracranial hemorrhage, definite new infarction, mass lesion, midline shift, or extra-axial fluid collection. Cisterns are patent. Cerebellar atrophy as well. Vascular: No hyperdense vessel or unexpected calcification. Skull: Normal. Negative for fracture or focal lesion. Sinuses/Orbits: No acute finding. Other: None. IMPRESSION: Stable brain atrophy pattern and chronic white matter microvascular ischemic changes. Remote right frontal subcortical white matter infarct as before. No interval change or acute intracranial abnormality by noncontrast CT. Electronically Signed   By: Jerilynn Mages.  Shick M.D.   On: 03/02/2019 13:31   Dg Chest Portable 1 View  Result Date: 03/08/2019 CLINICAL DATA:  Hypoxia. Weakness. EXAM: PORTABLE CHEST 1 VIEW COMPARISON:  Report from chest radiograph 12/23/2018, images not available. Radiograph 11/05/2015 FINDINGS: Cardiomegaly is unchanged. Unchanged mediastinal contours. Interstitial/bronchial coarsening, chronic and unchanged. No acute airspace disease. No evidence of pulmonary edema, pleural effusion or pneumothorax. No acute osseous abnormalities are seen. IMPRESSION: Stable cardiomegaly and chronic interstitial coarsening. Electronically  Signed   By: Keith Rake M.D.   On: 03/08/2019 23:50   Ct Orbits W Contrast  Result Date: 03/02/2019 CLINICAL DATA:  Elderly patient with pain and swelling to RIGHT eye for 2-3 days. EXAM: CT ORBITS WITH CONTRAST TECHNIQUE: Multidetector CT images was performed according to the standard protocol following intravenous contrast administration. CONTRAST:  77mL OMNIPAQUE IOHEXOL 300 MG/ML  SOLN COMPARISON:  None. FINDINGS: Orbits: There is proptosis of the RIGHT globe. There is a well-circumscribed mass in the medial orbit extending to the medial canthus, which appears to arise from the nasolacrimal duct, with a well-defined and avidly enhancing circumscribed margin, low attenuation central component, measuring 11 x 18 x 12 mm. There is mild preseptal periorbital soft tissue swelling. No retrobulbar mass. Globes are otherwise symmetric status post BILATERAL cataract extraction. Visualized sinuses: There is minor ethmoid sinus disease, but this is not a contributing factor to the mass. There is no significant maxillary, frontal, or sphenoid sinus fluid. Soft tissues: Other than the RIGHT orbital mass, none. Limited intracranial: Normal for age cerebral volume. No significant or unexpected findings. IMPRESSION: 1. 11 x 18 x 12 mm well-circumscribed RIGHT medial orbital/canthal mass, likely arising from the nasolacrimal duct, with proptosis of the RIGHT globe. Preseptal periorbital soft tissue swelling without retrobulbar mass. 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 Ophthalmologic consultation is warranted. Electronically Signed   By: Staci Righter M.D.   On: 03/02/2019 17:26     CBC Recent Labs  Lab 03/08/19 2328 03/09/19 0440  WBC 7.0 6.2  HGB 11.2* 11.2*  HCT 33.2* 33.9*  PLT 304 280  MCV 84.9 86.5  MCH 28.6 28.6  MCHC 33.7 33.0  RDW 16.5* 16.4*    Chemistries  Recent Labs  Lab 03/08/19 2328 03/09/19 0440 03/10/19 0300  NA 130* 131* 131*  K 3.9 3.8 3.7  CL 91*  92* 94*  CO2 27 29 25   GLUCOSE 114* 111* 103*  BUN 26* 23 19  CREATININE 1.46* 1.24* 1.17*  CALCIUM 8.6* 8.7* 8.9  AST 22  --   --   ALT 23  --   --   ALKPHOS 61  --   --   BILITOT 0.4  --   --    ------------------------------------------------------------------------------------------------------------------ estimated creatinine clearance is 28.1 mL/min (A) (by C-G formula based on SCr of 1.17 mg/dL (H)). ------------------------------------------------------------------------------------------------------------------ No results for input(s): HGBA1C in the last 72 hours. ------------------------------------------------------------------------------------------------------------------ No results for input(s): CHOL, HDL, LDLCALC, TRIG, CHOLHDL, LDLDIRECT in the last 72 hours. ------------------------------------------------------------------------------------------------------------------ No results for input(s): TSH, T4TOTAL, T3FREE, THYROIDAB in the last 72 hours.  Invalid input(s): FREET3 ------------------------------------------------------------------------------------------------------------------ No results for input(s): VITAMINB12, FOLATE, FERRITIN, TIBC, IRON, RETICCTPCT in the last 72 hours.  Coagulation profile No results for input(s): INR, PROTIME in the last 168 hours.  No results for input(s): DDIMER in the last 72 hours.  Cardiac Enzymes Recent Labs  Lab 03/08/19 2328  TROPONINI <0.03   ------------------------------------------------------------------------------------------------------------------ Invalid input(s): POCBNP    Assessment & Plan  Is 85 year old admitted with dizziness and weakness  1.  Dizziness improved continue Antivert  2.  Chronic systolic and diastolic CHF I do not feel that patient currently has acute CHF continue to monitor she has been seen by cardiology resume oral Lasix  3.  Acute kidney injury suspect this is due to Bactrim  therapy creatinine stable   4.  Recent orbital cellulitis continue Keflex  5.   Coronary artery disease continue home regimen  6.  Paroxysmal atrial fibrillation continue Eliquis and diltiazem  7.  Essential hypertension I will add hydralazine to current regimen  8. Depression continue duloxetine  9.  Generalized weakness patient will need rehab     Code Status Orders  (From admission, onward)         Start     Ordered   03/09/19 0257  Full code  Continuous  03/09/19 0257        Code Status History    Date Active Date Inactive Code Status Order ID Comments User Context   08/02/2017 0709 08/05/2017 1928 Full Code [id number]  Leim Fabry, MD Inpatient   11/03/2015 1532 11/08/2015 1011 Full Code [id number]  Theodoro Grist, MD Inpatient    Advance Directive Documentation     Most Recent Value  Type of Advance Directive  Healthcare Power of Attorney, Living will  Pre-existing out of facility DNR order (yellow form or pink MOST form)  -  "MOST" Form in Place?  -           Consults cardiology  DVT Prophylaxis Eliquis  Lab Results  Component Value Date   PLT 280 03/09/2019     Time Spent in minutes  35-minute  Dustin Flock M.D on 03/10/2019 at 11:37 AM  Between 7am to 6pm - Pager - [phone]  After 6pm go to [email or web address] - Proofreader  [employer]   Office  [phone]

## 2019-03-11 LAB — GLUCOSE, CAPILLARY
Glucose-Capillary: 128 mg/dL — ABNORMAL HIGH (ref 70–99)
Glucose-Capillary: 118 mg/dL — ABNORMAL HIGH (ref 70–99)

## 2019-03-11 MED ORDER — HYDRALAZINE HCL 25 MG PO TABS
25.0000 mg | ORAL_TABLET | Freq: Once | ORAL | Status: AC
  Administered 2019-03-11: 25 mg via ORAL
  Filled 2019-03-11: qty 1

## 2019-03-11 MED ORDER — ALPRAZOLAM 0.25 MG PO TABS: 0.1250 mg | ORAL_TABLET | Freq: Every evening | ORAL | 0 refills | Status: DC | PRN

## 2019-03-11 MED ORDER — PRAVASTATIN SODIUM 40 MG PO TABS: 40.0000 mg | ORAL_TABLET | Freq: Every day | ORAL | 0 refills | Status: DC

## 2019-03-11 MED ORDER — CEPHALEXIN 250 MG PO CAPS: 250.0000 mg | ORAL_CAPSULE | Freq: Three times a day (TID) | ORAL | 0 refills | Status: AC

## 2019-03-11 MED ORDER — DULOXETINE HCL 30 MG PO CPEP: 30.0000 mg | ORAL_CAPSULE | Freq: Every day | ORAL | 0 refills | Status: DC

## 2019-03-11 MED ORDER — HYDRALAZINE HCL 25 MG PO TABS: 25.0000 mg | ORAL_TABLET | Freq: Three times a day (TID) | ORAL | 0 refills | Status: DC

## 2019-03-11 MED ORDER — ANASTROZOLE 1 MG PO TABS: 1.0000 mg | ORAL_TABLET | Freq: Every day | ORAL | 0 refills | Status: AC

## 2019-03-11 MED ORDER — ERYTHROMYCIN 5 MG/GM OP OINT
TOPICAL_OINTMENT | Freq: Once | OPHTHALMIC | Status: AC
  Administered 2019-03-11: 13:00:00 via OPHTHALMIC
  Filled 2019-03-11: qty 1

## 2019-03-11 NOTE — TOC Transition Note (Signed)
Transition of Care [facility]) - CM/SW Discharge Note   Patient Details  Name: Rachael Horne MRN: [id number] Date of Birth: 02/14/1928  [facility] CM/SW Contact:  Ross Ludwig, LCSW Phone Number: 03/11/2019, 12:22 PM   Clinical Narrative:  Patient to be d/c'ed today to [facility] SNF room 5.  Patient and family agreeable to plans will transport via ems RN to call report to [phone].  CSW updated patient's son Leonor Liv [phone] and he is aware that patient is discharging today to SNF.     Final next level of care: [place] [facility] Barriers to Discharge: Barriers Resolved   Patient Goals and CMS Choice Patient states their goals for this hospitalization and ongoing recovery are:: Patient would like to return back home, but understands that she needs to go to some rehab first before returning back home. CMS Medicare.gov Compare Post Acute Care list provided to:: Patient Choice offered to / list presented to : Patient, Adult Children  Discharge Placement   Existing PASRR number confirmed : 03/10/19          Patient chooses bed at: [facility] Patient to be transferred to facility by: [facility] Name of family member notified: Patient's son Natsuko Kelsay [phone] Patient and family notified of of transfer: 03/11/19  Discharge Plan and Services In-house Referral: Clinical Social Work   Post Acute Care Choice: [place]          DME Arranged: N/A         Social Determinants of Health (SDOH) Interventions     Readmission Risk Interventions No flowsheet data found.

## 2019-03-11 NOTE — Progress Notes (Addendum)
Patient transported to [facility] via EMS in stable condition. Son [name] called and updated.  IV's out and tele monitor off.

## 2019-03-11 NOTE — Discharge Summary (Signed)
Rachael Horne NAME: Rachael Horne    MR#:  [id number]  DATE OF BIRTH:  Nov 20, 1928  DATE OF ADMISSION:  03/08/2019 ADMITTING PHYSICIAN: Lance Coon, MD  DATE OF DISCHARGE: 03/11/2019  PRIMARY CARE PHYSICIAN: Idelle Crouch, MD   ADMISSION DIAGNOSIS:  Renal insufficiency [N28.9] Generalized weakness [R53.1]  DISCHARGE DIAGNOSIS:  Principal Problem:   Acute on chronic combined systolic and diastolic CHF (congestive heart failure) (HCC) Active Problems:   CAD (coronary artery disease)   Diabetes mellitus type 2, uncomplicated (HCC)   HLD (hyperlipidemia)   Hypertension   Acute on chronic combined systolic and diastolic heart failure ([place])   SECONDARY DIAGNOSIS:   Past Medical History:  Diagnosis Date  . Anxiety   . Arthritis   . Atrial fibrillation ([place])   . Breast cancer [facility]) 2013   right breast, radiation  . Breast cancer ([place]) 2007   left breast, radiation  . Cancer ([place])    BL breast  . CHF (congestive heart failure) ([place])   . Coronary artery disease   . Diabetes mellitus without complication ([place])   . DVT (deep venous thrombosis) ([place]) 2003  . Dyspnea    with exertion  . Dysrhythmia   . GERD (gastroesophageal reflux disease)   . Hyperlipemia   . Hypertension   . Personal history of radiation therapy 2013   RIGHT lumpectomy  . Stroke [facility]) 2007   TIA  . Traumatic hematoma of left lower leg 07/2017     ADMITTING HISTORY Rachael Horne  is a 85 y.o. female who presents with chief complaint as above.  Patient presents the ED with a complaint of 24 hours of weakness.  She states she was recently admitted at [facility] for orbital cellulitis and had I&D performed with several days of antibiotics.  She was back home for a short period of time before developing significant weakness today.  Work-up in the ED shows acute kidney injury, and elevated BNP.  Patient does have a known history of heart failure, and has  significant bilateral lower extremity edema.  Hospitalist were called for admission  HOSPITAL COURSE:  Patient was admitted to telemetry for acute on chronic combined systolic diastolic heart failure exacerbation.  Patient was diuresed with Lasix IV.  Cardiology evaluation was done.  Patient continued anticoagulation with Eliquis for history of atrial fibrillation.  Patient's Bactrim was stopped secondary to acute kidney injury.  Creatinine stabilized.  Patient received and continued oral Keflex antibiotic for orbital cellulitis which improved.  Her atrial fibrillation was controlled with oral diltiazem and continued anticoagulation with Eliquis.  Hypertension was better controlled after hydralazine was added.  Physical therapy evaluated the patient and rehab placement was recommended.  Patient has a bed at [place] facility. Echocardiogram 1. The left ventricle has normal systolic function with an ejection fraction of 60-65%. The cavity size was normal. There is mildly increased left ventricular wall thickness. Left ventricular diastolic Doppler parameters are indeterminate.  2. The right ventricle has normal systolic function. The cavity was normal. There is no increase in right ventricular wall thickness.  3. Left atrial size was mildly dilated.  4. No evidence present in the left atrial appendage.  5. Right atrial size was mildly dilated.  6. The aortic valve is tricuspid. Mild thickening of the aortic valve. Mild calcification of the aortic valve. Aortic valve regurgitation was not assessed by color flow Doppler mild stenosis of the aortic valve.  7. No pulmonic  valve vegetation visualized. CONSULTS OBTAINED:  Treatment Team:  Isaias Cowman, MD  DRUG ALLERGIES:   Allergies  Allergen Reactions  . Amoxicillin Rash    DISCHARGE MEDICATIONS:   Allergies as of 03/11/2019      Reactions   Amoxicillin Rash      Medication List    STOP taking these medications    diphenhydramine-acetaminophen 25-500 MG Tabs tablet Commonly known as:  TYLENOL PM   GLUCOSAMINE-CHONDROITIN PO   lovastatin 40 MG tablet Commonly known as:  MEVACOR Replaced by:  pravastatin 40 MG tablet   sulfamethoxazole-trimethoprim 800-160 MG tablet Commonly known as:  BACTRIM DS,SEPTRA DS     TAKE these medications   acetaminophen 500 MG tablet Commonly known as:  TYLENOL Take 1,000 mg by mouth 2 (two) times daily as needed for mild pain.   albuterol 108 (90 Base) MCG/ACT inhaler Commonly known as:  PROVENTIL HFA;VENTOLIN HFA Inhale 2 puffs into the lungs every 6 (six) hours as needed for wheezing or shortness of breath.   alendronate 70 MG tablet Commonly known as:  FOSAMAX TAKE ONE TABLET BY MOUTH EACH WEEK, ON AN EMPTY STOMACH BEFORE BREAKFAST WITH 8oz OF WATER AND REMAIN UPRIGHT FOR :30   ALPRAZolam 0.25 MG tablet Commonly known as:  XANAX Take 0.5 tablets (0.125 mg total) by mouth at bedtime as needed for anxiety or sleep.   anastrozole 1 MG tablet Commonly known as:  ARIMIDEX Take 1 tablet (1 mg total) by mouth daily for 30 days. Start taking on:  March 12, 2019 What changed:  See the new instructions.   Antacid Calcium 500 MG chewable tablet Generic drug:  calcium carbonate Chew 1 tablet by mouth 2 (two) times daily.   budesonide-formoterol 160-4.5 MCG/ACT inhaler Commonly known as:  SYMBICORT Inhale 2 puffs into the lungs daily.   cephALEXin 250 MG capsule Commonly known as:  KEFLEX Take 1 capsule (250 mg total) by mouth every 8 (eight) hours for 5 days.   desloratadine 5 MG tablet Commonly known as:  CLARINEX Take by mouth.   diltiazem 240 MG 24 hr capsule Commonly known as:  DILACOR XR Take 240 mg by mouth daily.   DULoxetine 30 MG capsule Commonly known as:  CYMBALTA Take 1 capsule (30 mg total) by mouth daily for 30 days. Start taking on:  March 12, 2019 What changed:    how much to take  how to take this  when to take this    Eliquis 5 MG Tabs tablet Generic drug:  apixaban Take 5 mg by mouth 2 (two) times daily.   erythromycin ophthalmic ointment Apply 1 application to eye at bedtime.   fluticasone 50 MCG/ACT nasal spray Commonly known as:  FLONASE Place 2 sprays into both nostrils as needed.   furosemide 40 MG tablet Commonly known as:  LASIX Take 40 mg by mouth 2 (two) times daily.   gabapentin 100 MG capsule Commonly known as:  NEURONTIN   glucose blood test strip TEST BLOOD SUGAR DAILY AS DIRECTED   hydrALAZINE 25 MG tablet Commonly known as:  APRESOLINE Take 1 tablet (25 mg total) by mouth every 8 (eight) hours for 30 days.   loratadine 10 MG tablet Commonly known as:  CLARITIN Take 10 mg by mouth daily as needed for allergies.   magnesium oxide 400 MG tablet Commonly known as:  MAG-OX Take 400 mg by mouth daily at 12 noon.   metolazone 2.5 MG tablet Commonly known as:  ZAROXOLYN Take 2.5 mg by mouth  3 (three) times a week. Mon. Wed. Fri   montelukast 10 MG tablet Commonly known as:  SINGULAIR Take 10 mg by mouth at bedtime.   multivitamin with minerals Tabs tablet Take 1 tablet by mouth daily at 12 noon.   pantoprazole 40 MG tablet Commonly known as:  PROTONIX Take 40 mg by mouth as needed.   pravastatin 40 MG tablet Commonly known as:  PRAVACHOL Take 1 tablet (40 mg total) by mouth daily at 6 PM for 30 days. Replaces:  lovastatin 40 MG tablet   senna-docusate 8.6-50 MG tablet Commonly known as:  Senokot-S Take 1 tablet by mouth 2 (two) times daily as needed for mild constipation or moderate constipation.   sodium chloride 0.65 % Soln nasal spray Commonly known as:  OCEAN Place 1 spray into both nostrils as needed for congestion.       Today  Patient seen and evaluated today No shortness of breath No chest pain Tolerating diet well Off oxygen VITAL SIGNS:  Blood pressure (!) 172/65, pulse 83, temperature 98.4 F (36.9 C), temperature source Oral, resp. rate  18, height 5\' 4"  (1.626 m), weight 59.1 kg, SpO2 96 %.  I/O:    Intake/Output Summary (Last 24 hours) at 03/11/2019 1001 Last data filed at 03/11/2019 0747 Gross per 24 hour  Intake 480 ml  Output 1500 ml  Net -1020 ml    PHYSICAL EXAMINATION:  Physical Exam  GENERAL:  85 y.o.-year-old patient lying in the bed with no acute distress.  LUNGS: Normal breath sounds bilaterally, no wheezing, rales,rhonchi or crepitation. No use of accessory muscles of respiration.  CARDIOVASCULAR: S1, S2 normal. No murmurs, rubs, or gallops.  ABDOMEN: Soft, non-tender, non-distended. Bowel sounds present. No organomegaly or mass.  NEUROLOGIC: Moves all 4 extremities. PSYCHIATRIC: The patient is alert and oriented x 3.  SKIN: No obvious rash, lesion, or ulcer.   DATA REVIEW:   CBC Recent Labs  Lab 03/09/19 0440  WBC 6.2  HGB 11.2*  HCT 33.9*  PLT 280    Chemistries  Recent Labs  Lab 03/08/19 2328  03/10/19 0300  NA 130*   < > 131*  K 3.9   < > 3.7  CL 91*   < > 94*  CO2 27   < > 25  GLUCOSE 114*   < > 103*  BUN 26*   < > 19  CREATININE 1.46*   < > 1.17*  CALCIUM 8.6*   < > 8.9  AST 22  --   --   ALT 23  --   --   ALKPHOS 61  --   --   BILITOT 0.4  --   --    < > = values in this interval not displayed.    Cardiac Enzymes Recent Labs  Lab 03/08/19 2328  TROPONINI <0.03    Microbiology Results  Results for orders placed or performed during the hospital encounter of 08/02/17  Aerobic/Anaerobic Culture (surgical/deep wound)     Status: None   Collection Time: 08/02/17  6:21 PM  Result Value Ref Range Status   Specimen Description WOUND LEG HEMATOMA  Final   Special Requests NONE  Final   Gram Stain   Final    ABUNDANT WBC PRESENT,BOTH PMN AND MONONUCLEAR NO ORGANISMS SEEN    Culture   Final    No growth aerobically or anaerobically. Performed at [facility], [place] [address], [place], [place] [address]    Report Status 08/07/2017 FINAL  Final  RADIOLOGY:  No  results found.  Follow up with PCP in 1 week.  Management plans discussed with the patient, family and they are in agreement.  CODE STATUS: Full code    Code Status Orders  (From admission, onward)         Start     Ordered   03/09/19 0257  Full code  Continuous     03/09/19 0257        Code Status History    Date Active Date Inactive Code Status Order ID Comments User Context   08/02/2017 0709 08/05/2017 1928 Full Code [id number]  Leim Fabry, MD Inpatient   11/03/2015 1532 11/08/2015 1011 Full Code [id number]  Theodoro Grist, MD Inpatient    Advance Directive Documentation     Most Recent Value  Type of Advance Directive  Healthcare Power of [place], Living will  Pre-existing out of facility DNR order (yellow form or pink MOST form)  -  "MOST" Form in Place?  -      TOTAL TIME TAKING CARE OF THIS PATIENT ON DAY OF DISCHARGE: more than 35 minutes.   Saundra Shelling M.D on 03/11/2019 at 10:01 AM  Between 7am to 6pm - Pager - [phone]  After 6pm go to [email or web address] - password [email or web address] [facility]  Office  [phone]  CC: Primary care physician; Idelle Crouch, MD  Note: This dictation was prepared with Dragon dictation along with smaller phrase technology. Any transcriptional errors that result from this process are unintentional.

## 2019-03-11 NOTE — Progress Notes (Signed)
Report given to [place] at [facility]. EMS called for transport.

## 2019-03-11 NOTE — Plan of Care (Signed)
  Problem: Education: Goal: Knowledge of General Education information will improve Description Including pain rating scale, medication(s)/side effects and non-pharmacologic comfort measures Outcome: Adequate for Discharge   Problem: Health Behavior/Discharge Planning: Goal: Ability to manage health-related needs will improve Outcome: Adequate for Discharge   Problem: Clinical Measurements: Goal: Ability to maintain clinical measurements within normal limits will improve Outcome: Adequate for Discharge Goal: Will remain free from infection Outcome: Adequate for Discharge Goal: Diagnostic test results will improve Outcome: Adequate for Discharge Goal: Respiratory complications will improve Outcome: Adequate for Discharge Goal: Cardiovascular complication will be avoided Outcome: Adequate for Discharge   Problem: Activity: Goal: Risk for activity intolerance will decrease Outcome: Adequate for Discharge   Problem: Safety: Goal: Ability to remain free from injury will improve Outcome: Adequate for Discharge   Problem: Education: Goal: Ability to demonstrate management of disease process will improve Outcome: Adequate for Discharge Goal: Ability to verbalize understanding of medication therapies will improve Outcome: Adequate for Discharge Goal: Individualized Educational Video(s) Outcome: Adequate for Discharge   Problem: Activity: Goal: Capacity to carry out activities will improve Outcome: Adequate for Discharge   Problem: Cardiac: Goal: Ability to achieve and maintain adequate cardiopulmonary perfusion will improve Outcome: Adequate for Discharge

## 2019-03-12 ENCOUNTER — Ambulatory Visit: Payer: Medicare Other | Admitting: Oncology

## 2019-03-20 ENCOUNTER — Telehealth: Payer: Medicare Other | Admitting: Family

## 2019-09-06 ENCOUNTER — Other Ambulatory Visit: Payer: Self-pay

## 2019-09-06 ENCOUNTER — Emergency Department
Admission: EM | Admit: 2019-09-06 | Discharge: 2019-09-06 | Disposition: A | Discharge status: Home / Self Care | Payer: Medicare Other | Attending: Student | Admitting: Student

## 2019-09-06 ENCOUNTER — Emergency Department: Payer: Medicare Other

## 2019-09-06 DIAGNOSIS — R1031 Right lower quadrant pain: Secondary | ICD-10-CM | POA: Diagnosis not present

## 2019-09-06 DIAGNOSIS — Z853 Personal history of malignant neoplasm of breast: Secondary | ICD-10-CM | POA: Insufficient documentation

## 2019-09-06 DIAGNOSIS — Z79899 Other long term (current) drug therapy: Secondary | ICD-10-CM | POA: Insufficient documentation

## 2019-09-06 DIAGNOSIS — Z8673 Personal history of transient ischemic attack (TIA), and cerebral infarction without residual deficits: Secondary | ICD-10-CM | POA: Diagnosis not present

## 2019-09-06 DIAGNOSIS — I5042 Chronic combined systolic (congestive) and diastolic (congestive) heart failure: Secondary | ICD-10-CM | POA: Diagnosis not present

## 2019-09-06 DIAGNOSIS — Z7901 Long term (current) use of anticoagulants: Secondary | ICD-10-CM | POA: Diagnosis not present

## 2019-09-06 DIAGNOSIS — I110 Hypertensive heart disease with heart failure: Secondary | ICD-10-CM | POA: Insufficient documentation

## 2019-09-06 LAB — URINALYSIS, COMPLETE (UACMP) WITH MICROSCOPIC
Bacteria, UA: NONE SEEN
Bilirubin Urine: NEGATIVE
Glucose, UA: NEGATIVE mg/dL
Hgb urine dipstick: NEGATIVE
Ketones, ur: NEGATIVE mg/dL
Leukocytes,Ua: NEGATIVE
Nitrite: NEGATIVE
Protein, ur: NEGATIVE mg/dL
Specific Gravity, Urine: 1.012 (ref 1.005–1.030)
Squamous Epithelial / HPF: NONE SEEN (ref 0–5)
pH: 6 (ref 5.0–8.0)

## 2019-09-06 LAB — CBC
HCT: 40.6 % (ref 36.0–46.0)
Hemoglobin: 13.5 g/dL (ref 12.0–15.0)
MCH: 29 pg (ref 26.0–34.0)
MCHC: 33.3 g/dL (ref 30.0–36.0)
MCV: 87.3 fL (ref 80.0–100.0)
Platelets: 261 10*3/uL (ref 150–400)
RBC: 4.65 MIL/uL (ref 3.87–5.11)
RDW: 16.4 % — ABNORMAL HIGH (ref 11.5–15.5)
WBC: 6.8 10*3/uL (ref 4.0–10.5)
nRBC: 0 % (ref 0.0–0.2)

## 2019-09-06 LAB — COMPREHENSIVE METABOLIC PANEL
ALT: 21 U/L (ref 0–44)
AST: 25 U/L (ref 15–41)
Albumin: 4.1 g/dL (ref 3.5–5.0)
Alkaline Phosphatase: 56 U/L (ref 38–126)
Anion gap: 12 (ref 5–15)
BUN: 30 mg/dL — ABNORMAL HIGH (ref 8–23)
CO2: 29 mmol/L (ref 22–32)
Calcium: 9.9 mg/dL (ref 8.9–10.3)
Chloride: 92 mmol/L — ABNORMAL LOW (ref 98–111)
Creatinine, Ser: 0.91 mg/dL (ref 0.44–1.00)
GFR calc Af Amer: >60 mL/min (ref >60)
GFR calc non Af Amer: 56 mL/min — ABNORMAL LOW (ref >60)
Glucose, Bld: 136 mg/dL — ABNORMAL HIGH (ref 70–99)
Potassium: 3.8 mmol/L (ref 3.5–5.1)
Sodium: 133 mmol/L — ABNORMAL LOW (ref 135–145)
Total Bilirubin: 0.6 mg/dL (ref 0.3–1.2)
Total Protein: 7.1 g/dL (ref 6.5–8.1)

## 2019-09-06 LAB — LIPASE, BLOOD: Lipase: 31 U/L (ref 11–51)

## 2019-09-06 LAB — TROPONIN I (HIGH SENSITIVITY)
Troponin I (High Sensitivity): 14 ng/L (ref <18)
Troponin I (High Sensitivity): 15 ng/L (ref <18)

## 2019-09-06 MED ORDER — APIXABAN 5 MG PO TABS
5.0000 mg | ORAL_TABLET | Freq: Once | ORAL | Status: AC
  Administered 2019-09-06: 21:00:00 5 mg via ORAL
  Filled 2019-09-06: qty 1

## 2019-09-06 MED ORDER — IOHEXOL 300 MG/ML  SOLN
75.0000 mL | Freq: Once | INTRAMUSCULAR | Status: AC | PRN
  Administered 2019-09-06: 75 mL via INTRAVENOUS

## 2019-09-06 MED ORDER — SODIUM CHLORIDE 0.9% FLUSH: 3.0000 mL | Freq: Once | INTRAVENOUS | Status: DC

## 2019-09-06 NOTE — ED Notes (Signed)
In to meet pt... she says she can void now if we still need a specimen; pt required little assistance to get out of bed; used walker to the toilet; voided in specimen collection "hat"; pt ambulatory back to bed without incident; side rails up; call bell in reach;

## 2019-09-06 NOTE — Discharge Instructions (Addendum)
Thank you for letting us take care of you in the emergency department today.   Please continue to take any regular, prescribed medications.   Please follow up with: - Your cardiologist at your scheduled appointment - Your primary care doctor to review your ER visit and follow up on your symptoms.   Please return to the ER for any new or worsening symptoms.

## 2019-09-06 NOTE — ED Provider Notes (Signed)
[facility] Emergency Department Provider Note  ____________________________________________   First MD Initiated Contact with Patient 09/06/19 1647     (approximate)  I have reviewed the triage vital signs and the nursing notes.  History  Chief Complaint Abdominal Pain    HPI Rachael Horne is a 85 y.o. female with a history of atrial fibrillation, heart failure, other history as below, who presents to the emergency department for abdominal discomfort.  Patient states after eating a popsicle she developed some right lower quadrant abdominal discomfort.  She states it is difficult to describe, but feels almost like a pulling sensation.  The discomfort is worsened when she walks.  No associated trauma. She denies any associated nausea or vomiting.  No diarrhea.  She denies any fevers, chills, chest pain.  No sick contacts.  She is status post appendectomy and cholecystectomy.  She reports compliance with her anticoagulation for her atrial fibrillation.   Past Medical Hx Past Medical History:  Diagnosis Date  . Anxiety   . Arthritis   . Atrial fibrillation ([place])   . Breast cancer [facility]) 2013   right breast, radiation  . Breast cancer ([place]) 2007   left breast, radiation  . Cancer ([place])    BL breast  . CHF (congestive heart failure) ([place])   . Coronary artery disease   . Diabetes mellitus without complication ([place])   . DVT (deep venous thrombosis) ([place]) 2003  . Dyspnea    with exertion  . Dysrhythmia   . GERD (gastroesophageal reflux disease)   . Hyperlipemia   . Hypertension   . Personal history of radiation therapy 2013   RIGHT lumpectomy  . Stroke [facility]) 2007   TIA  . Traumatic hematoma of left lower leg 07/2017    Problem List Patient Active Problem List   Diagnosis Date Noted  . Acute on chronic combined systolic and diastolic heart failure ([place]) 03/10/2019  . Hematoma of leg 08/02/2017  . SOB (shortness of breath) on exertion 06/07/2017   . Menopausal osteoporosis 09/10/2016  . Chronic hip pain, left 08/08/2016  . Chronic midline low back pain without sciatica 08/08/2016  . Acute on chronic combined systolic and diastolic CHF (congestive heart failure) ([place]) 12/28/2015  . Sepsis ([place]) 11/03/2015  . Acute bronchitis 11/03/2015  . Fever 11/03/2015  . Atrial fibrillation with RVR ([place]) 11/03/2015  . Atrial fibrillation ([place]) 08/22/2015  . Primary cancer of upper outer quadrant of right female breast ([place]) 08/22/2015  . Diabetes mellitus type 2, uncomplicated ([place]) [id number]  . Environmental allergies 08/22/2015  . HLD (hyperlipidemia) 08/22/2015  . Hypertension 08/22/2015  . MR (mitral regurgitation) 08/22/2015  . Arthritis of hand, degenerative 08/22/2015  . TR (tricuspid regurgitation) 08/22/2015  . Bradycardia 08/23/2014  . CAD (coronary artery disease) 08/23/2014  . History of asthma 08/23/2014  . H/O deep venous thrombosis 08/23/2014  . History of tear of meniscus of knee joint 08/23/2014  . Osteoarthritis of both hands 08/23/2014  . Obesity, unspecified 08/23/2014  . TIA (transient ischemic attack) 08/23/2014  . Palpitations 08/23/2014  . Anemia, unspecified 05/06/2014  . Breast cancer ([place]) 12/03/2005  . History of CVA (cerebrovascular accident) 07/03/1997    Past Surgical Hx Past Surgical History:  Procedure Laterality Date  . APPENDECTOMY    . APPLICATION OF WOUND VAC Left 08/02/2017   Procedure: APPLICATION OF WOUND VAC;  Surgeon: Leim Fabry, MD;  Location: [facility] ORS;  Service: Orthopedics;  Laterality: Left;  . BILATERAL CARPAL TUNNEL RELEASE Bilateral   .  BREAST LUMPECTOMY Right 2013   RIGHT lumpectomy w/ radiation 2013  . BREAST LUMPECTOMY Left 2009   LEFT LUMPECTOMY W/ RADIATION 2009  . CARDIAC CATHETERIZATION    . CHOLECYSTECTOMY    . EYE SURGERY Bilateral    Cataract Extraction with IOL  . HERNIA REPAIR     Umbilical Hernia Repair  . I&D EXTREMITY Left 08/02/2017   Procedure: I & D LEFT  LEG HEMATOMA;  Surgeon: Leim Fabry, MD;  Location: [facility] ORS;  Service: Orthopedics;  Laterality: Left;  . TONSILLECTOMY      Medications Prior to Admission medications   Medication Sig Start Date End Date Taking? Authorizing Provider  acetaminophen (TYLENOL) 500 MG tablet Take 1,000 mg by mouth 2 (two) times daily as needed for mild pain.    [provider]  albuterol (PROVENTIL HFA;VENTOLIN HFA) 108 (90 Base) MCG/ACT inhaler Inhale 2 puffs into the lungs every 6 (six) hours as needed for wheezing or shortness of breath.    [provider]  alendronate (FOSAMAX) 70 MG tablet TAKE ONE TABLET BY MOUTH EACH WEEK, ON AN EMPTY STOMACH BEFORE BREAKFAST WITH 8oz OF WATER AND REMAIN UPRIGHT FOR :30 07/24/18   Lloyd Huger, MD  ALPRAZolam Duanne Moron) 0.25 MG tablet Take 0.5 tablets (0.125 mg total) by mouth at bedtime as needed for anxiety or sleep. 03/11/19   Saundra Shelling, MD  apixaban (ELIQUIS) 5 MG TABS tablet Take 5 mg by mouth 2 (two) times daily.    [provider]  budesonide-formoterol (SYMBICORT) 160-4.5 MCG/ACT inhaler Inhale 2 puffs into the lungs daily.     [provider]  calcium carbonate (ANTACID CALCIUM) 500 MG chewable tablet Chew 1 tablet by mouth 2 (two) times daily.    [provider]  desloratadine (CLARINEX) 5 MG tablet Take by mouth. 02/17/18 03/09/19  [provider]  diltiazem (DILACOR XR) 240 MG 24 hr capsule Take 240 mg by mouth daily.    [provider]  DULoxetine (CYMBALTA) 30 MG capsule Take 1 capsule (30 mg total) by mouth daily for 30 days. 03/12/19 04/11/19  Saundra Shelling, MD  fluticasone (FLONASE) 50 MCG/ACT nasal spray Place 2 sprays into both nostrils as needed.     [provider]  furosemide (LASIX) 40 MG tablet Take 40 mg by mouth 2 (two) times daily.     [provider]  gabapentin (NEURONTIN) 100 MG capsule  02/05/18   [provider]  glucose blood test strip TEST BLOOD SUGAR  DAILY AS DIRECTED 03/05/17   [provider]  hydrALAZINE (APRESOLINE) 25 MG tablet Take 1 tablet (25 mg total) by mouth every 8 (eight) hours for 30 days. 03/11/19 04/10/19  Saundra Shelling, MD  loratadine (CLARITIN) 10 MG tablet Take 10 mg by mouth daily as needed for allergies.    [provider]  magnesium oxide (MAG-OX) 400 MG tablet Take 400 mg by mouth daily at 12 noon.    [provider]  metolazone (ZAROXOLYN) 2.5 MG tablet Take 2.5 mg by mouth 3 (three) times a week. Mon. Wed. Fri    [provider]  montelukast (SINGULAIR) 10 MG tablet Take 10 mg by mouth at bedtime.    [provider]  Multiple Vitamin (MULTIVITAMIN WITH MINERALS) TABS tablet Take 1 tablet by mouth daily at 12 noon.    [provider]  pantoprazole (PROTONIX) 40 MG tablet Take 40 mg by mouth as needed.     [provider]  pravastatin (PRAVACHOL) 40 MG  tablet Take 1 tablet (40 mg total) by mouth daily at 6 PM for 30 days. 03/11/19 04/10/19  Saundra Shelling, MD  senna-docusate (SENOKOT-S) 8.6-50 MG tablet Take 1 tablet by mouth 2 (two) times daily as needed for mild constipation or moderate constipation. 11/07/15   Aldean Jewett, MD  sodium chloride (OCEAN) 0.65 % SOLN nasal spray Place 1 spray into both nostrils as needed for congestion. 11/07/15   Aldean Jewett, MD    Allergies Amoxicillin  Family Hx Family History  Problem Relation Age of Onset  . Breast cancer Neg Hx     Social Hx Social History   Tobacco Use  . Smoking status: Never Smoker  . Smokeless tobacco: Never Used  Substance Use Topics  . Alcohol use: No  . Drug use: No     Review of Systems  Constitutional: Negative for fever, chills. Eyes: Negative for visual changes. ENT: Negative for sore throat. Cardiovascular: Negative for chest pain. Respiratory: Negative for shortness of breath. Gastrointestinal: + abdominal pain  Genitourinary: Negative for dysuria. Musculoskeletal:  Negative for leg swelling. Skin: Negative for rash. Neurological: Negative for for headaches.   Physical Exam  Vital Signs: ED Triage Vitals  Enc Vitals Group     BP 09/06/19 1623 (!) 177/48     Pulse Rate 09/06/19 1623 64     Resp 09/06/19 1623 18     Temp 09/06/19 1623 97.8 F (36.6 C)     Temp Source 09/06/19 1623 Oral     SpO2 09/06/19 1623 99 %     Weight 09/06/19 1624 126 lb (57.2 kg)     Height 09/06/19 1624 4\' 11"  (1.499 m)     Head Circumference --      Peak Flow --      Pain Score 09/06/19 1623 0     Pain Loc --      Pain Edu? --      Excl. in [place]? --     Constitutional: Alert and oriented.  Head: Normocephalic. Atraumatic. Eyes: Conjunctivae clear. Sclera anicteric. Nose: No congestion. No rhinorrhea. Mouth/Throat: Mucous membranes are moist.  Neck: No stridor.   Cardiovascular: Irregular, rate controlled. No murmurs. Extremities well perfused. Respiratory: Normal respiratory effort.  Lungs CTAB. Gastrointestinal: Soft. Non-tender. Non-distended.  Musculoskeletal: No lower extremity edema. No deformities. Neurologic:  Normal speech and language. No gross focal neurologic deficits are appreciated.  Skin: Skin is warm, dry and intact. No rash noted. Psychiatric: Mood and affect are appropriate for situation.  EKG  Personally reviewed.   Rate: 60s Rhythm: atrial fibrillation Axis: RAD Intervals: normal QTc Deep T wave inversions in III, aVF which appear new from prior Atrial fibrillation, rate controlled No STEMI    Radiology  CT: IMPRESSION:  1. No CT evidence for acute intra-abdominal or pelvic abnormality.  2. Slightly thick-walled appearance of the anterior bladder,  questionable for cystitis  3. Sigmoid colon diverticula without acute inflammatory change  4. Cardiomegaly with prominent right atrial enlargement    Procedures  Procedure(s) performed (including critical care):  Procedures   Initial Impression / Assessment and Plan / ED  Course  85 y.o. female who presents to the ED for abdominal pain, as above  Ddx: UTI, nephrolithiasis, musculoskeletal.  Her EKG is abnormal and somewhat changed from prior, not a STEMI, but could consider atypical ACS. Exam is benign, no pain currently, and compliant with her anticoagulation, do not feel presentation consistent with mesenteric ischemia.   Plan: labs, urine, imaging  Labs  unremarkable, HS troponin x 2 less than 18, delta less than 5. CT negative (given no urinary symptoms, negative UA, do not feel findings represent acute cystitis). She has tolerated PO here. As such, will plan for discharge, advised outpatient follow up and given return precautions. Patient and son comfortable w/ plan.  Final Clinical Impression(s) / ED Diagnosis  Final diagnoses:  Abdominal discomfort in right lower quadrant       Note:  This document was prepared using Dragon voice recognition software and may include unintentional dictation errors.   Lilia Pro., MD 09/07/19 [phone]

## 2019-09-06 NOTE — ED Triage Notes (Signed)
Pt to the er for abd pain that started today. Pt states she made the mistake of telling her son who told her she needed to come in. Pt denies N/V. Pt states pain is worse with walking. Pain in the RLQ.

## 2019-10-02 ENCOUNTER — Ambulatory Visit: Payer: Medicare Other | Admitting: Family

## 2019-12-07 ENCOUNTER — Other Ambulatory Visit: Payer: Self-pay | Admitting: Ophthalmology

## 2019-12-07 ENCOUNTER — Other Ambulatory Visit (HOSPITAL_COMMUNITY): Payer: Self-pay | Admitting: Ophthalmology

## 2019-12-07 ENCOUNTER — Other Ambulatory Visit: Payer: Self-pay

## 2019-12-07 ENCOUNTER — Ambulatory Visit
Admission: RE | Admit: 2019-12-07 | Discharge: 2019-12-07 | Disposition: A | Discharge status: Home / Self Care | Payer: Medicare PPO | Source: Ambulatory Visit | Attending: Ophthalmology | Admitting: Ophthalmology

## 2019-12-07 DIAGNOSIS — H05011 Cellulitis of right orbit: Secondary | ICD-10-CM

## 2019-12-07 MED ORDER — IOHEXOL 300 MG/ML  SOLN
75.0000 mL | Freq: Once | INTRAMUSCULAR | Status: AC | PRN
  Administered 2019-12-07: 13:00:00 75 mL via INTRAVENOUS

## 2020-03-05 DIAGNOSIS — I4891 Unspecified atrial fibrillation: Secondary | ICD-10-CM | POA: Diagnosis not present

## 2020-03-05 DIAGNOSIS — I951 Orthostatic hypotension: Secondary | ICD-10-CM | POA: Diagnosis not present

## 2020-03-05 DIAGNOSIS — D6869 Other thrombophilia: Secondary | ICD-10-CM | POA: Diagnosis not present

## 2020-03-05 DIAGNOSIS — Z7901 Long term (current) use of anticoagulants: Secondary | ICD-10-CM | POA: Diagnosis not present

## 2020-03-05 DIAGNOSIS — Z880 Allergy status to penicillin: Secondary | ICD-10-CM | POA: Diagnosis not present

## 2020-03-05 DIAGNOSIS — E261 Secondary hyperaldosteronism: Secondary | ICD-10-CM | POA: Diagnosis not present

## 2020-03-05 DIAGNOSIS — G4700 Insomnia, unspecified: Secondary | ICD-10-CM | POA: Diagnosis not present

## 2020-03-05 DIAGNOSIS — I509 Heart failure, unspecified: Secondary | ICD-10-CM | POA: Diagnosis not present

## 2020-03-05 DIAGNOSIS — I110 Hypertensive heart disease with heart failure: Secondary | ICD-10-CM | POA: Diagnosis not present

## 2020-03-05 DIAGNOSIS — F419 Anxiety disorder, unspecified: Secondary | ICD-10-CM | POA: Diagnosis not present

## 2020-03-05 DIAGNOSIS — Z853 Personal history of malignant neoplasm of breast: Secondary | ICD-10-CM | POA: Diagnosis not present

## 2020-03-05 DIAGNOSIS — Z8249 Family history of ischemic heart disease and other diseases of the circulatory system: Secondary | ICD-10-CM | POA: Diagnosis not present

## 2020-03-05 DIAGNOSIS — Z79899 Other long term (current) drug therapy: Secondary | ICD-10-CM | POA: Diagnosis not present

## 2020-03-05 DIAGNOSIS — Z7951 Long term (current) use of inhaled steroids: Secondary | ICD-10-CM | POA: Diagnosis not present

## 2020-03-05 DIAGNOSIS — Z8673 Personal history of transient ischemic attack (TIA), and cerebral infarction without residual deficits: Secondary | ICD-10-CM | POA: Diagnosis not present

## 2020-03-05 DIAGNOSIS — G629 Polyneuropathy, unspecified: Secondary | ICD-10-CM | POA: Diagnosis not present

## 2020-03-05 DIAGNOSIS — Z604 Social exclusion and rejection: Secondary | ICD-10-CM | POA: Diagnosis not present

## 2020-03-09 DIAGNOSIS — H04301 Unspecified dacryocystitis of right lacrimal passage: Secondary | ICD-10-CM | POA: Diagnosis not present

## 2020-03-14 DIAGNOSIS — H04301 Unspecified dacryocystitis of right lacrimal passage: Secondary | ICD-10-CM | POA: Diagnosis not present

## 2020-05-09 ENCOUNTER — Inpatient Hospital Stay
Admission: EM | Admit: 2020-05-09 | Discharge: 2020-05-12 | DRG: 291 | Disposition: A | Discharge status: Home Health | Payer: Medicare PPO | Attending: Internal Medicine | Admitting: Internal Medicine

## 2020-05-09 ENCOUNTER — Emergency Department: Payer: Medicare PPO

## 2020-05-09 ENCOUNTER — Encounter: Payer: Self-pay | Admitting: *Deleted

## 2020-05-09 ENCOUNTER — Other Ambulatory Visit: Payer: Self-pay

## 2020-05-09 DIAGNOSIS — R29810 Facial weakness: Secondary | ICD-10-CM | POA: Diagnosis present

## 2020-05-09 DIAGNOSIS — Z853 Personal history of malignant neoplasm of breast: Secondary | ICD-10-CM | POA: Diagnosis not present

## 2020-05-09 DIAGNOSIS — R55 Syncope and collapse: Secondary | ICD-10-CM

## 2020-05-09 DIAGNOSIS — R001 Bradycardia, unspecified: Secondary | ICD-10-CM | POA: Diagnosis not present

## 2020-05-09 DIAGNOSIS — Z881 Allergy status to other antibiotic agents status: Secondary | ICD-10-CM

## 2020-05-09 DIAGNOSIS — Z20822 Contact with and (suspected) exposure to covid-19: Secondary | ICD-10-CM | POA: Diagnosis present

## 2020-05-09 DIAGNOSIS — I469 Cardiac arrest, cause unspecified: Secondary | ICD-10-CM | POA: Diagnosis not present

## 2020-05-09 DIAGNOSIS — Z7952 Long term (current) use of systemic steroids: Secondary | ICD-10-CM

## 2020-05-09 DIAGNOSIS — I081 Rheumatic disorders of both mitral and tricuspid valves: Secondary | ICD-10-CM | POA: Diagnosis present

## 2020-05-09 DIAGNOSIS — M818 Other osteoporosis without current pathological fracture: Secondary | ICD-10-CM | POA: Diagnosis present

## 2020-05-09 DIAGNOSIS — I5020 Unspecified systolic (congestive) heart failure: Secondary | ICD-10-CM

## 2020-05-09 DIAGNOSIS — I10 Essential (primary) hypertension: Secondary | ICD-10-CM | POA: Diagnosis not present

## 2020-05-09 DIAGNOSIS — I2510 Atherosclerotic heart disease of native coronary artery without angina pectoris: Secondary | ICD-10-CM | POA: Diagnosis present

## 2020-05-09 DIAGNOSIS — E876 Hypokalemia: Secondary | ICD-10-CM | POA: Diagnosis present

## 2020-05-09 DIAGNOSIS — I5043 Acute on chronic combined systolic (congestive) and diastolic (congestive) heart failure: Secondary | ICD-10-CM | POA: Diagnosis present

## 2020-05-09 DIAGNOSIS — Z7901 Long term (current) use of anticoagulants: Secondary | ICD-10-CM

## 2020-05-09 DIAGNOSIS — E119 Type 2 diabetes mellitus without complications: Secondary | ICD-10-CM | POA: Diagnosis not present

## 2020-05-09 DIAGNOSIS — F419 Anxiety disorder, unspecified: Secondary | ICD-10-CM | POA: Diagnosis present

## 2020-05-09 DIAGNOSIS — Z8673 Personal history of transient ischemic attack (TIA), and cerebral infarction without residual deficits: Secondary | ICD-10-CM

## 2020-05-09 DIAGNOSIS — E785 Hyperlipidemia, unspecified: Secondary | ICD-10-CM | POA: Diagnosis present

## 2020-05-09 DIAGNOSIS — R7989 Other specified abnormal findings of blood chemistry: Secondary | ICD-10-CM | POA: Diagnosis present

## 2020-05-09 DIAGNOSIS — I480 Paroxysmal atrial fibrillation: Secondary | ICD-10-CM | POA: Diagnosis present

## 2020-05-09 DIAGNOSIS — K219 Gastro-esophageal reflux disease without esophagitis: Secondary | ICD-10-CM | POA: Diagnosis present

## 2020-05-09 DIAGNOSIS — I248 Other forms of acute ischemic heart disease: Secondary | ICD-10-CM | POA: Diagnosis present

## 2020-05-09 DIAGNOSIS — I5033 Acute on chronic diastolic (congestive) heart failure: Secondary | ICD-10-CM | POA: Diagnosis not present

## 2020-05-09 DIAGNOSIS — I462 Cardiac arrest due to underlying cardiac condition: Secondary | ICD-10-CM | POA: Diagnosis not present

## 2020-05-09 DIAGNOSIS — E1151 Type 2 diabetes mellitus with diabetic peripheral angiopathy without gangrene: Secondary | ICD-10-CM | POA: Diagnosis present

## 2020-05-09 DIAGNOSIS — I110 Hypertensive heart disease with heart failure: Principal | ICD-10-CM | POA: Diagnosis present

## 2020-05-09 DIAGNOSIS — J302 Other seasonal allergic rhinitis: Secondary | ICD-10-CM | POA: Diagnosis present

## 2020-05-09 DIAGNOSIS — Z79899 Other long term (current) drug therapy: Secondary | ICD-10-CM

## 2020-05-09 DIAGNOSIS — Z7983 Long term (current) use of bisphosphonates: Secondary | ICD-10-CM | POA: Diagnosis not present

## 2020-05-09 DIAGNOSIS — Z923 Personal history of irradiation: Secondary | ICD-10-CM | POA: Diagnosis not present

## 2020-05-09 DIAGNOSIS — Z9049 Acquired absence of other specified parts of digestive tract: Secondary | ICD-10-CM | POA: Diagnosis not present

## 2020-05-09 DIAGNOSIS — Z86718 Personal history of other venous thrombosis and embolism: Secondary | ICD-10-CM | POA: Diagnosis not present

## 2020-05-09 LAB — HEPATIC FUNCTION PANEL
ALT: 19 U/L (ref 0–44)
AST: 22 U/L (ref 15–41)
Albumin: 4 g/dL (ref 3.5–5.0)
Alkaline Phosphatase: 52 U/L (ref 38–126)
Bilirubin, Direct: <0.1 mg/dL (ref 0.0–0.2)
Total Bilirubin: 0.7 mg/dL (ref 0.3–1.2)
Total Protein: 7 g/dL (ref 6.5–8.1)

## 2020-05-09 LAB — CBC
HCT: 38.3 % (ref 36.0–46.0)
Hemoglobin: 12.7 g/dL (ref 12.0–15.0)
MCH: 31.1 pg (ref 26.0–34.0)
MCHC: 33.2 g/dL (ref 30.0–36.0)
MCV: 93.6 fL (ref 80.0–100.0)
Platelets: 249 10*3/uL (ref 150–400)
RBC: 4.09 MIL/uL (ref 3.87–5.11)
RDW: 13.6 % (ref 11.5–15.5)
WBC: 7.7 10*3/uL (ref 4.0–10.5)
nRBC: 0 % (ref 0.0–0.2)

## 2020-05-09 LAB — BASIC METABOLIC PANEL
Anion gap: 10 (ref 5–15)
BUN: 35 mg/dL — ABNORMAL HIGH (ref 8–23)
CO2: 29 mmol/L (ref 22–32)
Calcium: 9.4 mg/dL (ref 8.9–10.3)
Chloride: 92 mmol/L — ABNORMAL LOW (ref 98–111)
Creatinine, Ser: 1.1 mg/dL — ABNORMAL HIGH (ref 0.44–1.00)
GFR calc Af Amer: 51 mL/min — ABNORMAL LOW (ref >60)
GFR calc non Af Amer: 44 mL/min — ABNORMAL LOW (ref >60)
Glucose, Bld: 108 mg/dL — ABNORMAL HIGH (ref 70–99)
Potassium: 3.6 mmol/L (ref 3.5–5.1)
Sodium: 131 mmol/L — ABNORMAL LOW (ref 135–145)

## 2020-05-09 LAB — TROPONIN I (HIGH SENSITIVITY): Troponin I (High Sensitivity): 13 ng/L (ref <18)

## 2020-05-09 LAB — SARS CORONAVIRUS 2 BY RT PCR (HOSPITAL ORDER, PERFORMED IN ~~LOC~~ HOSPITAL LAB): SARS Coronavirus 2: NEGATIVE

## 2020-05-09 LAB — BRAIN NATRIURETIC PEPTIDE: B Natriuretic Peptide: 282.5 pg/mL — ABNORMAL HIGH (ref 0.0–100.0)

## 2020-05-09 MED ORDER — ALBUTEROL SULFATE (2.5 MG/3ML) 0.083% IN NEBU: 3.0000 mL | INHALATION_SOLUTION | Freq: Four times a day (QID) | RESPIRATORY_TRACT | Status: DC | PRN

## 2020-05-09 MED ORDER — CALCIUM CARBONATE ANTACID 500 MG PO CHEW
1.0000 | CHEWABLE_TABLET | Freq: Two times a day (BID) | ORAL | Status: DC
  Administered 2020-05-09 – 2020-05-11 (×5): 200 mg via ORAL
  Filled 2020-05-09 (×6): qty 1

## 2020-05-09 MED ORDER — METOLAZONE 2.5 MG PO TABS
2.5000 mg | ORAL_TABLET | ORAL | Status: DC
  Administered 2020-05-11: 2.5 mg via ORAL
  Filled 2020-05-09: qty 1

## 2020-05-09 MED ORDER — SODIUM CHLORIDE 0.9 % IV SOLN: 250.0000 mL | INTRAVENOUS | Status: DC | PRN

## 2020-05-09 MED ORDER — ALENDRONATE SODIUM 70 MG PO TABS: 70.0000 mg | ORAL_TABLET | ORAL | Status: DC

## 2020-05-09 MED ORDER — ALPRAZOLAM 0.25 MG PO TABS: 0.1250 mg | ORAL_TABLET | Freq: Every evening | ORAL | Status: DC | PRN

## 2020-05-09 MED ORDER — HYDRALAZINE HCL 25 MG PO TABS
25.0000 mg | ORAL_TABLET | Freq: Three times a day (TID) | ORAL | Status: DC
  Administered 2020-05-09 – 2020-05-12 (×8): 25 mg via ORAL
  Filled 2020-05-09 (×8): qty 1

## 2020-05-09 MED ORDER — SALINE SPRAY 0.65 % NA SOLN
1.0000 | NASAL | Status: DC | PRN
  Filled 2020-05-09: qty 44

## 2020-05-09 MED ORDER — ZOLPIDEM TARTRATE 5 MG PO TABS
5.0000 mg | ORAL_TABLET | Freq: Every evening | ORAL | Status: DC | PRN
  Administered 2020-05-09: 5 mg via ORAL
  Filled 2020-05-09: qty 1

## 2020-05-09 MED ORDER — SENNOSIDES-DOCUSATE SODIUM 8.6-50 MG PO TABS
1.0000 | ORAL_TABLET | Freq: Two times a day (BID) | ORAL | Status: DC | PRN
  Administered 2020-05-09: 1 via ORAL
  Filled 2020-05-09: qty 1

## 2020-05-09 MED ORDER — ASPIRIN EC 81 MG PO TBEC
81.0000 mg | DELAYED_RELEASE_TABLET | Freq: Every day | ORAL | Status: DC
  Administered 2020-05-10 – 2020-05-12 (×3): 81 mg via ORAL
  Filled 2020-05-09 (×3): qty 1

## 2020-05-09 MED ORDER — FLUTICASONE PROPIONATE 50 MCG/ACT NA SUSP
2.0000 | Freq: Every day | NASAL | Status: DC | PRN
  Filled 2020-05-09: qty 16

## 2020-05-09 MED ORDER — ONDANSETRON HCL 4 MG/2ML IJ SOLN
4.0000 mg | Freq: Four times a day (QID) | INTRAMUSCULAR | Status: DC | PRN
  Administered 2020-05-10: 4 mg via INTRAVENOUS
  Filled 2020-05-09: qty 2

## 2020-05-09 MED ORDER — DILTIAZEM HCL ER COATED BEADS 240 MG PO CP24
240.0000 mg | ORAL_CAPSULE | Freq: Every day | ORAL | Status: DC
  Administered 2020-05-10: 240 mg via ORAL
  Filled 2020-05-09 (×2): qty 1

## 2020-05-09 MED ORDER — SODIUM CHLORIDE 0.9% FLUSH: 3.0000 mL | Freq: Once | INTRAVENOUS | Status: DC

## 2020-05-09 MED ORDER — PANTOPRAZOLE SODIUM 40 MG PO TBEC
40.0000 mg | DELAYED_RELEASE_TABLET | Freq: Every day | ORAL | Status: DC
  Administered 2020-05-10 – 2020-05-11 (×2): 40 mg via ORAL
  Filled 2020-05-09 (×3): qty 1

## 2020-05-09 MED ORDER — DULOXETINE HCL 30 MG PO CPEP
30.0000 mg | ORAL_CAPSULE | Freq: Every day | ORAL | Status: DC
  Administered 2020-05-10 – 2020-05-12 (×3): 30 mg via ORAL
  Filled 2020-05-09 (×3): qty 1

## 2020-05-09 MED ORDER — MONTELUKAST SODIUM 10 MG PO TABS
10.0000 mg | ORAL_TABLET | Freq: Every day | ORAL | Status: DC
  Administered 2020-05-09 – 2020-05-11 (×3): 10 mg via ORAL
  Filled 2020-05-09 (×3): qty 1

## 2020-05-09 MED ORDER — ADULT MULTIVITAMIN W/MINERALS CH
1.0000 | ORAL_TABLET | Freq: Every day | ORAL | Status: DC
  Administered 2020-05-10 – 2020-05-11 (×2): 1 via ORAL
  Filled 2020-05-09 (×3): qty 1

## 2020-05-09 MED ORDER — LORATADINE 10 MG PO TABS: 10.0000 mg | ORAL_TABLET | Freq: Every day | ORAL | Status: DC | PRN

## 2020-05-09 MED ORDER — SODIUM CHLORIDE 0.9% FLUSH
3.0000 mL | Freq: Two times a day (BID) | INTRAVENOUS | Status: DC
  Administered 2020-05-10 – 2020-05-12 (×6): 3 mL via INTRAVENOUS

## 2020-05-09 MED ORDER — ACETAMINOPHEN 325 MG PO TABS
650.0000 mg | ORAL_TABLET | ORAL | Status: DC | PRN
  Administered 2020-05-09 – 2020-05-10 (×2): 650 mg via ORAL
  Filled 2020-05-09 (×2): qty 2

## 2020-05-09 MED ORDER — GABAPENTIN 100 MG PO CAPS
100.0000 mg | ORAL_CAPSULE | Freq: Every day | ORAL | Status: DC
  Administered 2020-05-09 – 2020-05-11 (×3): 100 mg via ORAL
  Filled 2020-05-09 (×3): qty 1

## 2020-05-09 MED ORDER — MAGNESIUM OXIDE 400 (241.3 MG) MG PO TABS
400.0000 mg | ORAL_TABLET | Freq: Every day | ORAL | Status: DC
  Administered 2020-05-10 – 2020-05-11 (×2): 400 mg via ORAL
  Filled 2020-05-09 (×3): qty 1

## 2020-05-09 MED ORDER — ALPRAZOLAM 0.25 MG PO TABS: 0.2500 mg | ORAL_TABLET | Freq: Two times a day (BID) | ORAL | Status: DC | PRN

## 2020-05-09 MED ORDER — LORATADINE 10 MG PO TABS: 10.0000 mg | ORAL_TABLET | Freq: Every day | ORAL | Status: DC

## 2020-05-09 MED ORDER — PRAVASTATIN SODIUM 40 MG PO TABS
40.0000 mg | ORAL_TABLET | Freq: Every day | ORAL | Status: DC
  Administered 2020-05-11: 40 mg via ORAL
  Filled 2020-05-09: qty 2

## 2020-05-09 MED ORDER — INSULIN ASPART 100 UNIT/ML ~~LOC~~ SOLN
0.0000 [IU] | Freq: Three times a day (TID) | SUBCUTANEOUS | Status: DC
  Filled 2020-05-09: qty 1

## 2020-05-09 MED ORDER — ENOXAPARIN SODIUM 30 MG/0.3ML ~~LOC~~ SOLN
30.0000 mg | SUBCUTANEOUS | Status: DC
  Filled 2020-05-09: qty 0.3

## 2020-05-09 MED ORDER — FUROSEMIDE 10 MG/ML IJ SOLN
40.0000 mg | Freq: Two times a day (BID) | INTRAMUSCULAR | Status: DC
  Administered 2020-05-10 – 2020-05-11 (×3): 40 mg via INTRAVENOUS
  Filled 2020-05-09 (×3): qty 4

## 2020-05-09 MED ORDER — SODIUM CHLORIDE 0.9% FLUSH: 3.0000 mL | INTRAVENOUS | Status: DC | PRN

## 2020-05-09 MED ORDER — FUROSEMIDE 10 MG/ML IJ SOLN
40.0000 mg | Freq: Once | INTRAMUSCULAR | Status: AC
  Administered 2020-05-09: 40 mg via INTRAVENOUS
  Filled 2020-05-09: qty 4

## 2020-05-09 MED ORDER — APIXABAN 2.5 MG PO TABS
2.5000 mg | ORAL_TABLET | Freq: Two times a day (BID) | ORAL | Status: DC
  Administered 2020-05-10 – 2020-05-12 (×6): 2.5 mg via ORAL
  Filled 2020-05-09 (×6): qty 1

## 2020-05-09 NOTE — Progress Notes (Signed)
PHARMACIST - PHYSICIAN COMMUNICATION  CONCERNING:  Enoxaparin (Lovenox) for DVT Prophylaxis   RECOMMENDATION: Patient was prescribed enoxaprin 40mg  q24 hours for VTE prophylaxis.   Filed Weights   05/09/20 1545  Weight: 60.8 kg (134 lb)    Body mass index is 27.06 kg/m.  Estimated Creatinine Clearance: 26.9 mL/min (A) (by C-G formula based on SCr of 1.1 mg/dL (H)).  Patient is candidate for enoxaparin 30mg  every 24 hours based on CrCl <63ml/min   DESCRIPTION: Pharmacy has adjusted enoxaparin dose per [facility] policy.  Patient is now receiving enoxaparin 30mg  every 24 hours.  Pernell Dupre, PharmD, BCPS Clinical Pharmacist 05/09/2020 8:03 PM

## 2020-05-09 NOTE — ED Triage Notes (Signed)
Pt to triage via wheelchair from home. Pt reports swelling of both legs for 3 days.  Pt also has sob.  No chest pain.  No cough.  Nonsmoker.  Pt alert  Speech clear.

## 2020-05-09 NOTE — ED Provider Notes (Signed)
[facility] Emergency Department Provider Note  ____________________________________________   First MD Initiated Contact with Patient 05/09/20 1755     (approximate)  I have reviewed the triage vital signs and the nursing notes.   HISTORY  Chief Complaint Leg Swelling    HPI Rachael Horne is a 86 y.o. female here with leg swelling.  Patient states that over the last week or 2, she has gained approximately 5 to 6 pounds.  She has had progressively worsening, increasingly severe lower extremity swelling, which is now limiting her ability to get around the house.  She has been taking metolazone and Lasix without significant relief.  She normally uses a walker but is able to get around fairly well, but essentially was unable to walk independently today, due to the degree of swelling.  She also notes a new complaint of drooling from the right side of her mouth over the last 2 weeks.  She is unclear necessarily when this began, but she has had a stroke with right facial weakness in the past and she feels like this is slightly worse.  Denies any difficulty speaking or swallowing.  No headaches.  No fevers or chills.  Nursing medication changes.  No other acute medical complaints.        Past Medical History:  Diagnosis Date  . Anxiety   . Arthritis   . Atrial fibrillation ([place])   . Breast cancer [facility]) 2013   right breast, radiation  . Breast cancer ([place]) 2007   left breast, radiation  . Cancer ([place])    BL breast  . CHF (congestive heart failure) ([place])   . Coronary artery disease   . Diabetes mellitus without complication ([place])   . DVT (deep venous thrombosis) ([place]) 2003  . Dyspnea    with exertion  . Dysrhythmia   . GERD (gastroesophageal reflux disease)   . Hyperlipemia   . Hypertension   . Personal history of radiation therapy 2013   RIGHT lumpectomy  . Stroke [facility]) 2007   TIA  . Traumatic hematoma of left lower leg 07/2017    Patient Active  Problem List   Diagnosis Date Noted  . Acute on chronic combined systolic and diastolic heart failure ([place]) 03/10/2019  . Hematoma of leg 08/02/2017  . SOB (shortness of breath) on exertion 06/07/2017  . Menopausal osteoporosis 09/10/2016  . Chronic hip pain, left 08/08/2016  . Chronic midline low back pain without sciatica 08/08/2016  . Acute on chronic combined systolic and diastolic CHF (congestive heart failure) ([place]) 12/28/2015  . Sepsis ([place]) 11/03/2015  . Acute bronchitis 11/03/2015  . Fever 11/03/2015  . Atrial fibrillation with RVR ([place]) 11/03/2015  . Atrial fibrillation ([place]) 08/22/2015  . Primary cancer of upper outer quadrant of right female breast ([place]) 08/22/2015  . Diabetes mellitus type 2, uncomplicated ([place]) 10/93/2355  . Environmental allergies 08/22/2015  . HLD (hyperlipidemia) 08/22/2015  . Hypertension 08/22/2015  . MR (mitral regurgitation) 08/22/2015  . Arthritis of hand, degenerative 08/22/2015  . TR (tricuspid regurgitation) 08/22/2015  . Bradycardia 08/23/2014  . CAD (coronary artery disease) 08/23/2014  . History of asthma 08/23/2014  . H/O deep venous thrombosis 08/23/2014  . History of tear of meniscus of knee joint 08/23/2014  . Osteoarthritis of both hands 08/23/2014  . Obesity, unspecified 08/23/2014  . TIA (transient ischemic attack) 08/23/2014  . Palpitations 08/23/2014  . Anemia, unspecified 05/06/2014  . Breast cancer ([place]) 12/03/2005  . History of CVA (cerebrovascular accident) 07/03/1997  Past Surgical History:  Procedure Laterality Date  . APPENDECTOMY    . APPLICATION OF WOUND VAC Left 08/02/2017   Procedure: APPLICATION OF WOUND VAC;  Surgeon: Leim Fabry, MD;  Location: [facility] ORS;  Service: Orthopedics;  Laterality: Left;  . BILATERAL CARPAL TUNNEL RELEASE Bilateral   . BREAST LUMPECTOMY Right 2013   RIGHT lumpectomy w/ radiation 2013  . BREAST LUMPECTOMY Left 2009   LEFT LUMPECTOMY W/ RADIATION 2009  . CARDIAC CATHETERIZATION     . CHOLECYSTECTOMY    . EYE SURGERY Bilateral    Cataract Extraction with IOL  . HERNIA REPAIR     Umbilical Hernia Repair  . I & D EXTREMITY Left 08/02/2017   Procedure: I & D LEFT LEG HEMATOMA;  Surgeon: Leim Fabry, MD;  Location: [facility] ORS;  Service: Orthopedics;  Laterality: Left;  . TONSILLECTOMY      Prior to Admission medications   Medication Sig Start Date End Date Taking? Authorizing Provider  acetaminophen (TYLENOL) 500 MG tablet Take 1,000 mg by mouth 2 (two) times daily as needed for mild pain.    [provider]  albuterol (PROVENTIL HFA;VENTOLIN HFA) 108 (90 Base) MCG/ACT inhaler Inhale 2 puffs into the lungs every 6 (six) hours as needed for wheezing or shortness of breath.    [provider]  alendronate (FOSAMAX) 70 MG tablet TAKE ONE TABLET BY MOUTH EACH WEEK, ON AN EMPTY STOMACH BEFORE BREAKFAST WITH 8oz OF WATER AND REMAIN UPRIGHT FOR :30 07/24/18   Lloyd Huger, MD  ALPRAZolam Duanne Moron) 0.25 MG tablet Take 0.5 tablets (0.125 mg total) by mouth at bedtime as needed for anxiety or sleep. 03/11/19   Saundra Shelling, MD  apixaban (ELIQUIS) 5 MG TABS tablet Take 5 mg by mouth 2 (two) times daily.    [provider]  budesonide-formoterol (SYMBICORT) 160-4.5 MCG/ACT inhaler Inhale 2 puffs into the lungs daily.     [provider]  calcium carbonate (ANTACID CALCIUM) 500 MG chewable tablet Chew 1 tablet by mouth 2 (two) times daily.    [provider]  desloratadine (CLARINEX) 5 MG tablet Take by mouth. 02/17/18 03/09/19  [provider]  diltiazem (DILACOR XR) 240 MG 24 hr capsule Take 240 mg by mouth daily.    [provider]  DULoxetine (CYMBALTA) 30 MG capsule Take 1 capsule (30 mg total) by mouth daily for 30 days. 03/12/19 04/11/19  Saundra Shelling, MD  fluticasone (FLONASE) 50 MCG/ACT nasal spray Place 2 sprays into both nostrils as needed.     [provider]  furosemide (LASIX) 40 MG tablet Take 40 mg by  mouth 2 (two) times daily.     [provider]  gabapentin (NEURONTIN) 100 MG capsule  02/05/18   [provider]  glucose blood test strip TEST BLOOD SUGAR DAILY AS DIRECTED 03/05/17   [provider]  hydrALAZINE (APRESOLINE) 25 MG tablet Take 1 tablet (25 mg total) by mouth every 8 (eight) hours for 30 days. 03/11/19 04/10/19  Saundra Shelling, MD  loratadine (CLARITIN) 10 MG tablet Take 10 mg by mouth daily as needed for allergies.    [provider]  magnesium oxide (MAG-OX) 400 MG tablet Take 400 mg by mouth daily at 12 noon.    [provider]  metolazone (ZAROXOLYN) 2.5 MG tablet Take 2.5 mg by mouth 3 (three) times a week. Mon. Wed. Fri    [provider]  montelukast (SINGULAIR) 10 MG tablet Take 10 mg by mouth at bedtime.  [provider]  Multiple Vitamin (MULTIVITAMIN WITH MINERALS) TABS tablet Take 1 tablet by mouth daily at 12 noon.    [provider]  pantoprazole (PROTONIX) 40 MG tablet Take 40 mg by mouth as needed.     [provider]  pravastatin (PRAVACHOL) 40 MG tablet Take 1 tablet (40 mg total) by mouth daily at 6 PM for 30 days. 03/11/19 04/10/19  Saundra Shelling, MD  senna-docusate (SENOKOT-S) 8.6-50 MG tablet Take 1 tablet by mouth 2 (two) times daily as needed for mild constipation or moderate constipation. 11/07/15   Aldean Jewett, MD  sodium chloride (OCEAN) 0.65 % SOLN nasal spray Place 1 spray into both nostrils as needed for congestion. 11/07/15   Aldean Jewett, MD    Allergies Amoxicillin  Family History  Problem Relation Age of Onset  . Breast cancer Neg Hx     Social History Social History   Tobacco Use  . Smoking status: Never Smoker  . Smokeless tobacco: Never Used  Substance Use Topics  . Alcohol use: No  . Drug use: No    Review of Systems  Review of Systems  Constitutional: Positive for fatigue. Negative for fever.  HENT: Negative for congestion and sore throat.     Eyes: Negative for visual disturbance.  Respiratory: Negative for cough and shortness of breath.   Cardiovascular: Positive for leg swelling. Negative for chest pain.  Gastrointestinal: Negative for abdominal pain, diarrhea, nausea and vomiting.  Genitourinary: Negative for flank pain.  Musculoskeletal: Positive for gait problem. Negative for back pain and neck pain.  Skin: Negative for rash and wound.  Neurological: Negative for weakness.  All other systems reviewed and are negative.    ____________________________________________  PHYSICAL EXAM:      VITAL SIGNS: ED Triage Vitals  Enc Vitals Group     BP 05/09/20 1544 (!) 159/47     Pulse Rate 05/09/20 1544 (!) 50     Resp 05/09/20 1544 20     Temp 05/09/20 1544 99.5 F (37.5 C)     Temp Source 05/09/20 1544 Oral     SpO2 05/09/20 1544 98 %     Weight 05/09/20 1545 134 lb (60.8 kg)     Height 05/09/20 1545 4\' 11"  (1.499 m)     Head Circumference --      Peak Flow --      Pain Score 05/09/20 1544 5     Pain Loc --      Pain Edu? --      Excl. in [place]? --      Physical Exam Vitals and nursing note reviewed.  Constitutional:      General: She is not in acute distress.    Appearance: She is well-developed.  HENT:     Head: Normocephalic and atraumatic.  Eyes:     Conjunctiva/sclera: Conjunctivae normal.  Cardiovascular:     Rate and Rhythm: Normal rate and regular rhythm.     Heart sounds: Normal heart sounds. No murmur. No friction rub.  Pulmonary:     Effort: Pulmonary effort is normal. No respiratory distress.     Breath sounds: Rales (bibasilar, R>L) present. No wheezing.  Abdominal:     General: There is no distension.     Palpations: Abdomen is soft.     Tenderness: There is no abdominal tenderness.  Musculoskeletal:     Cervical back: Neck supple.     Right lower leg: Edema (3+ pitting) present.     Left  lower leg: Edema (3+ pitting) present.  Skin:    General: Skin is warm.     Capillary Refill:  Capillary refill takes less than 2 seconds.  Neurological:     Mental Status: She is alert and oriented to person, place, and time.     Motor: No abnormal muscle tone.     Comments: Subtle right NLF and facial weakness. CN otherwise intact. Strength 5/5 bl UE and LE. Normal sensation to light touch bl UE and LE. Gait deferred.       ____________________________________________   LABS (all labs ordered are listed, but only abnormal results are displayed)  Labs Reviewed  BASIC METABOLIC PANEL - Abnormal; Notable for the following components:      Result Value   Sodium 131 (*)    Chloride 92 (*)    Glucose, Bld 108 (*)    BUN 35 (*)    Creatinine, Ser 1.10 (*)    GFR calc non Af Amer 44 (*)    GFR calc Af Amer 51 (*)    All other components within normal limits  BRAIN NATRIURETIC PEPTIDE - Abnormal; Notable for the following components:   B Natriuretic Peptide 282.5 (*)    All other components within normal limits  SARS CORONAVIRUS 2 BY RT PCR (HOSPITAL ORDER, [facility])  CBC  HEPATIC FUNCTION PANEL  TROPONIN I (HIGH SENSITIVITY)  TROPONIN I (HIGH SENSITIVITY)    ____________________________________________  EKG: Junctional rhythm, VR 57. QRS 86, QTc 459. No acute ST elevations or depressions. No ischemia or infarct. ________________________________________  RADIOLOGY All imaging, including plain films, CT scans, and ultrasounds, independently reviewed by me, and interpretations confirmed via formal radiology reads.  ED MD interpretation:   CXR: Clear  Official radiology report(s): DG Chest 2 View  Result Date: 05/09/2020 CLINICAL DATA:  Lower extremity edema for 3 days, short of breath EXAM: CHEST - 2 VIEW COMPARISON:  03/08/2019 FINDINGS: Frontal and lateral views of the chest demonstrates stable enlargement of the cardiac silhouette. Calcified granuloma left apex unchanged. Chronic central vascular congestion without airspace disease,  effusion, or pneumothorax. Background interstitial prominence consistent with emphysema unchanged. No acute bony abnormalities. IMPRESSION: 1. Stable exam, no acute process. Electronically Signed   By: Randa Ngo M.D.   On: 05/09/2020 16:55    ____________________________________________  PROCEDURES   Procedure(s) performed (including Critical Care):  Procedures  ____________________________________________  INITIAL IMPRESSION / MDM / [place] / ED COURSE  As part of my medical decision making, I reviewed the following data within the [place] notes reviewed and incorporated, Old chart reviewed, Notes from prior ED visits, and [place] was evaluated in Emergency Department on 05/09/2020 for the symptoms described in the history of present illness. She was evaluated in the context of the global COVID-19 pandemic, which necessitated consideration that the patient might be at risk for infection with the SARS-CoV-2 virus that causes COVID-19. Institutional protocols and algorithms that pertain to the evaluation of patients at risk for COVID-19 are in a state of rapid change based on information released by regulatory bodies including the CDC and federal and state organizations. These policies and algorithms were followed during the patient's care in the ED.  Some ED evaluations and interventions may be delayed as a result of limited staffing during the pandemic.*  Clinical Course as of May 09 1920  Mon May 09, 2020  [age] yo F here with b/l lower extremity edema, >5 lb weight gain, and inability to ambulate independently. Suspect acute HFpEF exacerbation. She has marked edema and is unable to walk on my assessment. No overt pulm edema on CXR though R basilar and L basilar rales noted on exam. BNP moderately elevated. Given her age and fact that she lives alone, plus has already gained >5 lb despite taking  metolazone three times weekly and lasix, feel she is best served with initial IV diuresis.   [CI]    Clinical Course User Index [CI] Duffy Bruce, MD    Medical Decision Making:  As above. HFrEF exacerbation, admit to medicine. Unclear of significance of R sided facial drooling - CT head pending, certainly outside of any tPA or interventional window.   ____________________________________________  FINAL CLINICAL IMPRESSION(S) / ED DIAGNOSES  Final diagnoses:  HFrEF (heart failure with reduced ejection fraction) (HCC)  Elevated brain natriuretic peptide (BNP) level     MEDICATIONS GIVEN DURING THIS VISIT:  Medications  sodium chloride flush (NS) 0.9 % injection 3 mL (has no administration in time range)  furosemide (LASIX) injection 40 mg (has no administration in time range)     ED Discharge Orders    None       Note:  This document was prepared using Dragon voice recognition software and may include unintentional dictation errors.   Duffy Bruce, MD 05/09/20 Dorthula Perfect

## 2020-05-09 NOTE — Progress Notes (Signed)
PHARMACIST - PHYSICIAN COMMUNICATION  CONCERNING: P&T Medication Policy Regarding Oral Bisphosphonates  RECOMMENDATION: Your order for alendronate (Fosamax), ibandronate (Boniva), or risedronate (Actonel) has been discontinued at this time.  If the patient's post-hospital medical condition warrants safe use of this class of drugs, please resume the pre-hospital regimen upon discharge.  DESCRIPTION:  Alendronate (Fosamax), ibandronate (Boniva), and risedronate (Actonel) can cause severe esophageal erosions in patients who are unable to remain upright at least 30 minutes after taking this medication.   Since brief interruptions in therapy are thought to have minimal impact on bone mineral density, the [place] has established that bisphosphonate orders should be routinely discontinued during hospitalization.   To override this safety policy and permit administration of Boniva, Fosamax, or Actonel in the hospital, prescribers must write "DO NOT HOLD" in the comments section when placing the order for this class of medications.  Pernell Dupre, PharmD, BCPS Clinical Pharmacist 05/09/2020 8:09 PM

## 2020-05-09 NOTE — ED Triage Notes (Signed)
First Nurse Note:  Arrives from [facility] for ED evaluation of bilateral leg swelling and SOB.  Patient is AAOx3.  Skin warm and dry. NAD

## 2020-05-09 NOTE — H&P (Signed)
[facility] NAME: Rachael Horne    MR#:  [id number]  DATE OF BIRTH:  1928/09/02  DATE OF ADMISSION:  05/09/2020  PRIMARY CARE PHYSICIAN: Idelle Crouch, MD   REQUESTING/REFERRING PHYSICIAN: Duffy Bruce, MD CHIEF COMPLAINT:   Chief Complaint  Patient presents with   Leg Swelling    HISTORY OF PRESENT ILLNESS:  Rachael Horne  is a 86 y.o. Caucasian female with a known history of history of atrial fibrillation, systolic and diastolic CHF, type 2 diabetes mellitus, hypertension, dyslipidemia, CVA and coronary artery disease, who presented to the emergency room with acute onset of worsening bilateral lower extremity edema and significant difficulty  with ambulation secondarily as well as dyspnea, orthopnea and occasional paroxysmal nocturnal dyspnea with dyspnea on exertion.  She gained 4 pounds over the last couple weeks.  She denied any cough or wheezing.  No fever or chills.  No dysuria or oliguria or hematuria urgency or frequency or flank pain.  Upon presentation to the emergency room, blood pressure was 159/47 with heart rate of 50 and pulse 70 was 98% on room air.  Labs revealed a BUN of 35 and creatinine 1.1 with a BNP of 282.5 and high-sensitivity troponin I of 15 and later 13.  COVID-19 PCR came back negative.  Chest x-ray showed stable cardiomegaly with chronic central vascular congestion without airspace disease, effusion or pneumothorax, emphysema and calcified granuloma at the left apex that is unchanged.  The patient was given 40 mg of IV Lasix.  She will be admitted to a progressive unit bed for further evaluation and management  PAST MEDICAL HISTORY:   Past Medical History:  Diagnosis Date   Anxiety    Arthritis    Atrial fibrillation ([place])    Breast cancer ([place]) 2013   right breast, radiation   Breast cancer ([place]) 2007   left breast, radiation   Cancer ([place])    BL breast   CHF (congestive heart failure) ([place])     Coronary artery disease    Diabetes mellitus without complication ([place])    DVT (deep venous thrombosis) ([place]) 2003   Dyspnea    with exertion   Dysrhythmia    GERD (gastroesophageal reflux disease)    Hyperlipemia    Hypertension    Personal history of radiation therapy 2013   RIGHT lumpectomy   Stroke [facility]) 2007   TIA   Traumatic hematoma of left lower leg 07/2017    PAST SURGICAL HISTORY:   Past Surgical History:  Procedure Laterality Date   APPENDECTOMY     APPLICATION OF WOUND VAC Left 08/02/2017   Procedure: APPLICATION OF WOUND VAC;  Surgeon: Leim Fabry, MD;  Location: [facility] ORS;  Service: Orthopedics;  Laterality: Left;   BILATERAL CARPAL TUNNEL RELEASE Bilateral    BREAST LUMPECTOMY Right 2013   RIGHT lumpectomy w/ radiation 2013   BREAST LUMPECTOMY Left 2009   LEFT LUMPECTOMY W/ RADIATION 2009   CARDIAC CATHETERIZATION     CHOLECYSTECTOMY     EYE SURGERY Bilateral    Cataract Extraction with IOL   HERNIA REPAIR     Umbilical Hernia Repair   I & D EXTREMITY Left 08/02/2017   Procedure: I & D LEFT LEG HEMATOMA;  Surgeon: Leim Fabry, MD;  Location: [facility] ORS;  Service: Orthopedics;  Laterality: Left;   TONSILLECTOMY      SOCIAL HISTORY:   Social History   Tobacco Use   Smoking status: Never Smoker   Smokeless  tobacco: Never Used  Substance Use Topics   Alcohol use: No    FAMILY HISTORY:   Family History  Problem Relation Age of Onset   Breast cancer Neg Hx     DRUG ALLERGIES:   Allergies  Allergen Reactions   Amoxicillin Rash    REVIEW OF SYSTEMS:   ROS As per history of present illness. All pertinent systems were reviewed above. Constitutional,  HEENT, cardiovascular, respiratory, GI, GU, musculoskeletal, neuro, psychiatric, endocrine,  integumentary and hematologic systems were reviewed and are otherwise  negative/unremarkable except for positive findings mentioned above in the HPI.   MEDICATIONS AT HOME:    Prior to Admission medications   Medication Sig Start Date End Date Taking? Authorizing Provider  acetaminophen (TYLENOL) 500 MG tablet Take 1,000 mg by mouth 2 (two) times daily as needed for mild pain.    [provider]  albuterol (PROVENTIL HFA;VENTOLIN HFA) 108 (90 Base) MCG/ACT inhaler Inhale 2 puffs into the lungs every 6 (six) hours as needed for wheezing or shortness of breath.    [provider]  alendronate (FOSAMAX) 70 MG tablet TAKE ONE TABLET BY MOUTH EACH WEEK, ON AN EMPTY STOMACH BEFORE BREAKFAST WITH 8oz OF WATER AND REMAIN UPRIGHT FOR :30 07/24/18   Lloyd Huger, MD  ALPRAZolam Duanne Moron) 0.25 MG tablet Take 0.5 tablets (0.125 mg total) by mouth at bedtime as needed for anxiety or sleep. 03/11/19   Saundra Shelling, MD  apixaban (ELIQUIS) 5 MG TABS tablet Take 5 mg by mouth 2 (two) times daily.    [provider]  budesonide-formoterol (SYMBICORT) 160-4.5 MCG/ACT inhaler Inhale 2 puffs into the lungs daily.     [provider]  calcium carbonate (ANTACID CALCIUM) 500 MG chewable tablet Chew 1 tablet by mouth 2 (two) times daily.    [provider]  desloratadine (CLARINEX) 5 MG tablet Take by mouth. 02/17/18 03/09/19  [provider]  diltiazem (DILACOR XR) 240 MG 24 hr capsule Take 240 mg by mouth daily.    [provider]  DULoxetine (CYMBALTA) 30 MG capsule Take 1 capsule (30 mg total) by mouth daily for 30 days. 03/12/19 04/11/19  Saundra Shelling, MD  fluticasone (FLONASE) 50 MCG/ACT nasal spray Place 2 sprays into both nostrils as needed.     [provider]  furosemide (LASIX) 40 MG tablet Take 40 mg by mouth 2 (two) times daily.     [provider]  gabapentin (NEURONTIN) 100 MG capsule  02/05/18   [provider]  glucose blood test strip TEST BLOOD SUGAR DAILY AS DIRECTED 03/05/17   [provider]  hydrALAZINE (APRESOLINE) 25 MG tablet Take 1 tablet (25 mg total) by mouth every 8  (eight) hours for 30 days. 03/11/19 04/10/19  Saundra Shelling, MD  loratadine (CLARITIN) 10 MG tablet Take 10 mg by mouth daily as needed for allergies.    [provider]  magnesium oxide (MAG-OX) 400 MG tablet Take 400 mg by mouth daily at 12 noon.    [provider]  metolazone (ZAROXOLYN) 2.5 MG tablet Take 2.5 mg by mouth 3 (three) times a week. Mon. Wed. Fri    [provider]  montelukast (SINGULAIR) 10 MG tablet Take 10 mg by mouth at bedtime.    [provider]  Multiple Vitamin (MULTIVITAMIN WITH MINERALS) TABS tablet Take 1 tablet by mouth daily at 12 noon.    [provider]  pantoprazole (PROTONIX) 40 MG tablet Take 40 mg by mouth as needed.  [provider]  pravastatin (PRAVACHOL) 40 MG tablet Take 1 tablet (40 mg total) by mouth daily at 6 PM for 30 days. 03/11/19 04/10/19  Saundra Shelling, MD  senna-docusate (SENOKOT-S) 8.6-50 MG tablet Take 1 tablet by mouth 2 (two) times daily as needed for mild constipation or moderate constipation. 11/07/15   Aldean Jewett, MD  sodium chloride (OCEAN) 0.65 % SOLN nasal spray Place 1 spray into both nostrils as needed for congestion. 11/07/15   Aldean Jewett, MD      VITAL SIGNS:  Blood pressure (!) 159/47, pulse (!) 50, temperature 99.5 F (37.5 C), temperature source Oral, resp. rate 20, height 4\' 11"  (1.499 m), weight 60.8 kg, SpO2 98 %.  PHYSICAL EXAMINATION:  Physical Exam  GENERAL:  86 y.o.-year-old Caucasian patient lying in the bed with no acute distress.  EYES: Pupils equal, round, reactive to light and accommodation. No scleral icterus. Extraocular muscles intact.  HEENT: Head atraumatic, normocephalic. Oropharynx and nasopharynx clear.  NECK:  Supple, no jugular venous distention. No thyroid enlargement, no tenderness.  LUNGS: Slightly diminished bibasal breath sounds with fine bibasilar rales. CARDIOVASCULAR: Regular rate and rhythm, S1, S2 normal. No murmurs, rubs, or  gallops.  ABDOMEN: Soft, nondistended, nontender. Bowel sounds present. No organomegaly or mass.  EXTREMITIES: 2+ bilateral pitting lower extremity edema with no cyanosis, or clubbing.  NEUROLOGIC: Cranial nerves II through XII are intact. Muscle strength 5/5 in all extremities. Sensation intact. Gait not checked.  PSYCHIATRIC: The patient is alert and oriented x 3.  Normal affect and good eye contact. SKIN: No obvious rash, lesion, or ulcer.   LABORATORY PANEL:   CBC Recent Labs  Lab 05/09/20 1546  WBC 7.7  HGB 12.7  HCT 38.3  PLT 249   ------------------------------------------------------------------------------------------------------------------  Chemistries  Recent Labs  Lab 05/09/20 1546 05/09/20 1821  NA 131*  --   K 3.6  --   CL 92*  --   CO2 29  --   GLUCOSE 108*  --   BUN 35*  --   CREATININE 1.10*  --   CALCIUM 9.4  --   AST  --  22  ALT  --  19  ALKPHOS  --  52  BILITOT  --  0.7   ------------------------------------------------------------------------------------------------------------------  Cardiac Enzymes No results for input(s): TROPONINI in the last 168 hours. ------------------------------------------------------------------------------------------------------------------  RADIOLOGY:  DG Chest 2 View  Result Date: 05/09/2020 CLINICAL DATA:  Lower extremity edema for 3 days, short of breath EXAM: CHEST - 2 VIEW COMPARISON:  03/08/2019 FINDINGS: Frontal and lateral views of the chest demonstrates stable enlargement of the cardiac silhouette. Calcified granuloma left apex unchanged. Chronic central vascular congestion without airspace disease, effusion, or pneumothorax. Background interstitial prominence consistent with emphysema unchanged. No acute bony abnormalities. IMPRESSION: 1. Stable exam, no acute process. Electronically Signed   By: Randa Ngo M.D.   On: 05/09/2020 16:55   CT Head Wo Contrast  Result Date: 05/09/2020 CLINICAL DATA:   Left-sided facial droop. EXAM: CT HEAD WITHOUT CONTRAST TECHNIQUE: Contiguous axial images were obtained from the base of the skull through the vertex without intravenous contrast. COMPARISON:  March 30th 2020 FINDINGS: Brain: No evidence of acute infarction, hemorrhage, hydrocephalus, extra-axial collection or mass lesion/mass effect. Again noted is significant encephalomalacia with advanced chronic microvascular ischemic changes. There is right frontal lobe encephalomalacia again noted. Vascular: No hyperdense vessel or unexpected calcification. Skull: Normal. Negative for fracture or focal lesion. Sinuses/Orbits: No acute finding. Other: None. IMPRESSION: No acute  intracranial abnormality. Electronically Signed   By: Constance Holster M.D.   On: 05/09/2020 19:26      IMPRESSION AND PLAN:  1.  Acute on chronic diastolic CHF. -The patient will be admitted to a progressive unit bed. -She will be diuresed with IV Lasix and will continue her Zaroxolyn. -Strict I's and O's and fluid restriction will be applied. -The patient's last 2D echo revealed an EF of 60 to 65% on 03/09/2019. -Cardiology consultation will be obtained as well as 2D echo. -I notified Dr. Nehemiah Massed about the patient.  2.  Hypertension. -Her Dilacor XR and Cozaar will be continued.  3.  Paroxysmal atrial fibrillation. -We will continue Eliquis and Dilacor XR.  4.  Type 2 diabetes mellitus. -The patient will be placed on supplement coverage with NovoLog.  5.  DVT prophylaxis. -We will continue Eliquis.   All the records are reviewed and case discussed with ED provider. The plan of care was discussed in details with the patient (and family). I answered all questions. The patient agreed to proceed with the above mentioned plan. Further management will depend upon hospital course.   CODE STATUS: Full code  Status is: Inpatient  Remains inpatient appropriate because:Ongoing diagnostic testing needed not appropriate for  outpatient work up, Unsafe d/c plan, IV treatments appropriate due to intensity of illness or inability to take PO and Inpatient level of care appropriate due to severity of illness   Dispo: The patient is from: Home              Anticipated d/c is to: Home              Anticipated d/c date is: 2 days              Patient currently is not medically stable to d/c.   TOTAL TIME TAKING CARE OF THIS PATIENT: 55 minutes.    Christel Mormon M.D on 05/09/2020 at 7:30 PM  [facility]   From 7 PM-7 AM, contact night-coverage [email or web address]  CC: Primary care physician; Idelle Crouch, MD   Note: This dictation was prepared with Dragon dictation along with smaller phrase technology. Any transcriptional errors that result from this process are unintentional.

## 2020-05-10 ENCOUNTER — Encounter: Payer: Self-pay | Admitting: Family Medicine

## 2020-05-10 ENCOUNTER — Inpatient Hospital Stay
Admit: 2020-05-10 | Discharge: 2020-05-10 | Disposition: A | Discharge status: Home / Self Care | Payer: Medicare PPO | Attending: Family Medicine | Admitting: Family Medicine

## 2020-05-10 DIAGNOSIS — I5043 Acute on chronic combined systolic (congestive) and diastolic (congestive) heart failure: Secondary | ICD-10-CM

## 2020-05-10 DIAGNOSIS — I469 Cardiac arrest, cause unspecified: Secondary | ICD-10-CM

## 2020-05-10 LAB — BASIC METABOLIC PANEL
Anion gap: 10 (ref 5–15)
BUN: 27 mg/dL — ABNORMAL HIGH (ref 8–23)
CO2: 30 mmol/L (ref 22–32)
Calcium: 9.6 mg/dL (ref 8.9–10.3)
Chloride: 94 mmol/L — ABNORMAL LOW (ref 98–111)
Creatinine, Ser: 0.95 mg/dL (ref 0.44–1.00)
GFR calc Af Amer: >60 mL/min (ref >60)
GFR calc non Af Amer: 53 mL/min — ABNORMAL LOW (ref >60)
Glucose, Bld: 109 mg/dL — ABNORMAL HIGH (ref 70–99)
Potassium: 3 mmol/L — ABNORMAL LOW (ref 3.5–5.1)
Sodium: 134 mmol/L — ABNORMAL LOW (ref 135–145)

## 2020-05-10 LAB — HEMOGLOBIN A1C
Hgb A1c MFr Bld: 6.8 % — ABNORMAL HIGH (ref 4.8–5.6)
Mean Plasma Glucose: 148.46 mg/dL

## 2020-05-10 LAB — ECHOCARDIOGRAM COMPLETE
Height: 59 in
Weight: 2112 oz

## 2020-05-10 LAB — GLUCOSE, CAPILLARY
Glucose-Capillary: 109 mg/dL — ABNORMAL HIGH (ref 70–99)
Glucose-Capillary: 140 mg/dL — ABNORMAL HIGH (ref 70–99)
Glucose-Capillary: 161 mg/dL — ABNORMAL HIGH (ref 70–99)
Glucose-Capillary: 151 mg/dL — ABNORMAL HIGH (ref 70–99)
Glucose-Capillary: 148 mg/dL — ABNORMAL HIGH (ref 70–99)
Glucose-Capillary: 133 mg/dL — ABNORMAL HIGH (ref 70–99)
Glucose-Capillary: 118 mg/dL — ABNORMAL HIGH (ref 70–99)

## 2020-05-10 LAB — TROPONIN I (HIGH SENSITIVITY): Troponin I (High Sensitivity): 15 ng/L (ref <18)

## 2020-05-10 LAB — MAGNESIUM: Magnesium: 2.1 mg/dL (ref 1.7–2.4)

## 2020-05-10 LAB — TSH: TSH: 9.584 u[IU]/mL — ABNORMAL HIGH (ref 0.350–4.500)

## 2020-05-10 LAB — MRSA PCR SCREENING: MRSA by PCR: POSITIVE — AB

## 2020-05-10 MED ORDER — CHLORHEXIDINE GLUCONATE CLOTH 2 % EX PADS
6.0000 | MEDICATED_PAD | Freq: Every day | CUTANEOUS | Status: DC
  Administered 2020-05-10 – 2020-05-11 (×2): 6 via TOPICAL

## 2020-05-10 MED ORDER — HYDRALAZINE HCL 20 MG/ML IJ SOLN
10.0000 mg | Freq: Four times a day (QID) | INTRAMUSCULAR | Status: DC | PRN
  Administered 2020-05-10 – 2020-05-11 (×2): 10 mg via INTRAVENOUS
  Filled 2020-05-10 (×2): qty 1

## 2020-05-10 MED ORDER — POTASSIUM CHLORIDE CRYS ER 20 MEQ PO TBCR
40.0000 meq | EXTENDED_RELEASE_TABLET | ORAL | Status: AC
  Administered 2020-05-10 (×2): 40 meq via ORAL
  Filled 2020-05-10 (×2): qty 2

## 2020-05-10 MED ORDER — POTASSIUM CHLORIDE CRYS ER 20 MEQ PO TBCR
20.0000 meq | EXTENDED_RELEASE_TABLET | Freq: Every day | ORAL | Status: DC
  Administered 2020-05-10 – 2020-05-12 (×3): 20 meq via ORAL
  Filled 2020-05-10 (×3): qty 1

## 2020-05-10 MED ORDER — PERFLUTREN LIPID MICROSPHERE
1.0000 mL | INTRAVENOUS | Status: AC | PRN
  Administered 2020-05-10: 2 mL via INTRAVENOUS
  Filled 2020-05-10: qty 10

## 2020-05-10 NOTE — Consult Note (Signed)
[facility] Consultation Note  Patient ID: Rachael Horne, MRN: [id number], DOB/AGE: 84-Oct-1929 84 y.o. Admit date: 05/09/2020   Date of Consult: 05/10/2020 Primary Physician: Idelle Crouch, MD Primary Cardiologist: Paraschos  Chief Complaint:  Chief Complaint  Patient presents with  . Leg Swelling   Reason for Consult: Heart failure  HPI: 86 y.o. female with known acute on chronic diastolic dysfunction congestive heart failure diabetes hypertension hyperlipidemia coronary artery disease peripheral vascular disease previous history of atrial fibrillation having acute on chronic diastolic dysfunction congestive heart failure.  The patient has had a significant amount of lower extremity edema increased weight gain shortness of breath weakness and fatigue and hypoxia.  When seen in the emergency room the patient had chest x-ray consistent with pulmonary edema with lower extremity edema troponin level consistent with minimal mild demand ischemia and an EKG showing atrial fibrillation with slow ventricular rate.  The patient previously has been on appropriate medication management for atrial fibrillation including anticoagulation.  She does have a slow ventricular rate of atrial fibrillation and has had an episode of which she has a very slow heart rate possibly consistent with sick sinus syndrome possibly needing further intervention depending on her future symptoms.  In addition to that the patient has been on pravastatin for risk reduction cardiovascular disease and Lasix and metolazone for congestive heart failure.  With this the patient has had intravenous Lasix which has helped a little bit with her blood pressure control.  She has not had any syncopal episodes in the past and will have to review her rhythm disturbances to assess the need for possible treatment including pacemaker placement in the future due to sick sinus syndrome.  Currently the patient is hemodynamically stable and  breathing better Past Medical History:  Diagnosis Date  . Anxiety   . Arthritis   . Atrial fibrillation ([place])   . Breast cancer [facility]) 2013   right breast, radiation  . Breast cancer ([place]) 2007   left breast, radiation  . Cancer ([place])    BL breast  . CHF (congestive heart failure) ([place])   . Coronary artery disease   . Diabetes mellitus without complication ([place])   . DVT (deep venous thrombosis) ([place]) 2003  . Dyspnea    with exertion  . Dysrhythmia   . GERD (gastroesophageal reflux disease)   . Hyperlipemia   . Hypertension   . Personal history of radiation therapy 2013   RIGHT lumpectomy  . Stroke [facility]) 2007   TIA  . Traumatic hematoma of left lower leg 07/2017      Surgical History:  Past Surgical History:  Procedure Laterality Date  . APPENDECTOMY    . APPLICATION OF WOUND VAC Left 08/02/2017   Procedure: APPLICATION OF WOUND VAC;  Surgeon: Leim Fabry, MD;  Location: [facility] ORS;  Service: Orthopedics;  Laterality: Left;  . BILATERAL CARPAL TUNNEL RELEASE Bilateral   . BREAST LUMPECTOMY Right 2013   RIGHT lumpectomy w/ radiation 2013  . BREAST LUMPECTOMY Left 2009   LEFT LUMPECTOMY W/ RADIATION 2009  . CARDIAC CATHETERIZATION    . CHOLECYSTECTOMY    . EYE SURGERY Bilateral    Cataract Extraction with IOL  . HERNIA REPAIR     Umbilical Hernia Repair  . I & D EXTREMITY Left 08/02/2017   Procedure: I & D LEFT LEG HEMATOMA;  Surgeon: Leim Fabry, MD;  Location: [facility] ORS;  Service: Orthopedics;  Laterality: Left;  . TONSILLECTOMY       Home  Meds: Prior to Admission medications   Medication Sig Start Date End Date Taking? Authorizing Provider  acetaminophen (TYLENOL) 500 MG tablet Take 1,000 mg by mouth 2 (two) times daily as needed for mild pain.   Yes [provider]  albuterol (PROVENTIL HFA;VENTOLIN HFA) 108 (90 Base) MCG/ACT inhaler Inhale 2 puffs into the lungs every 6 (six) hours as needed for wheezing or shortness of breath.   Yes [provider]  ALPRAZolam (XANAX) 0.25 MG tablet Take 0.25 mg by mouth at bedtime as needed for anxiety or sleep.   Yes [provider]  apixaban (ELIQUIS) 2.5 MG TABS tablet Take 2.5 mg by mouth 2 (two) times daily.    Yes [provider]  budesonide-formoterol (SYMBICORT) 160-4.5 MCG/ACT inhaler Inhale 2 puffs into the lungs 2 (two) times daily.    Yes [provider]  calcium carbonate (ANTACID CALCIUM) 500 MG chewable tablet Chew 1 tablet by mouth 2 (two) times daily.   Yes [provider]  diltiazem (DILACOR XR) 240 MG 24 hr capsule Take 240 mg by mouth daily.   Yes [provider]  fluticasone (FLONASE) 50 MCG/ACT nasal spray Place 2 sprays into both nostrils daily as needed for allergies or rhinitis.    Yes [provider]  furosemide (LASIX) 40 MG tablet Take 80 mg by mouth daily.    Yes [provider]  gabapentin (NEURONTIN) 100 MG capsule Take 100-300 mg by mouth See admin instructions. Take 1 to 2 capsules (100mg -200mg ) by mouth during the daytime and take 2 to 3 capsules (200mg -300mg ) by mouth as bedtime   Yes [provider]  levocetirizine (XYZAL) 5 MG tablet Take 5 mg by mouth daily. 04/30/20  Yes [provider]  losartan (COZAAR) 50 MG tablet Take 50 mg by mouth daily. 03/21/20  Yes [provider]  magnesium oxide (MAG-OX) 400 MG tablet Take 400 mg by mouth daily at 12 noon.   Yes [provider]  metolazone (ZAROXOLYN) 2.5 MG tablet Take 2.5 mg by mouth every Monday, Wednesday, and Friday.    Yes [provider]  montelukast (SINGULAIR) 10 MG tablet Take 10 mg by mouth at bedtime.   Yes [provider]  Multiple Vitamin (MULTIVITAMIN WITH MINERALS) TABS tablet Take 1 tablet by mouth daily at 12 noon.   Yes [provider]  potassium chloride (KLOR-CON) 10 MEQ tablet Take 20 mEq by mouth at bedtime. 03/30/20  Yes [provider]  senna-docusate (SENOKOT-S) 8.6-50  MG tablet Take 1 tablet by mouth 2 (two) times daily as needed for mild constipation or moderate constipation. 11/07/15  Yes Aldean Jewett, MD  sodium chloride (OCEAN) 0.65 % SOLN nasal spray Place 1 spray into both nostrils as needed for congestion. 11/07/15  Yes Aldean Jewett, MD    Inpatient Medications:  . apixaban  2.5 mg Oral BID  . aspirin EC  81 mg Oral Daily  . calcium carbonate  1 tablet Oral BID  . Chlorhexidine Gluconate Cloth  6 each Topical Daily  . DULoxetine  30 mg Oral Daily  . furosemide  40 mg Intravenous Q12H  . gabapentin  100 mg Oral QHS  . hydrALAZINE  25 mg Oral Q8H  . insulin aspart  0-15 Units Subcutaneous TID PC & HS  . magnesium oxide  400 mg Oral Q1200  . [START ON 05/11/2020] metolazone  2.5 mg Oral Once per day on Mon Wed Fri  . montelukast  10 mg Oral QHS  .  multivitamin with minerals  1 tablet Oral Q1200  . pantoprazole  40 mg Oral Daily  . potassium chloride  20 mEq Oral Daily  . pravastatin  40 mg Oral q1800  . sodium chloride flush  3 mL Intravenous Once  . sodium chloride flush  3 mL Intravenous Q12H   . sodium chloride      Allergies:  Allergies  Allergen Reactions  . Amoxicillin Rash    Social History   Socioeconomic History  . Marital status: Widowed    Spouse name: Not on file  . Number of children: Not on file  . Years of education: Not on file  . Highest education level: Not on file  Occupational History  . Not on file  Tobacco Use  . Smoking status: Never Smoker  . Smokeless tobacco: Never Used  Substance and Sexual Activity  . Alcohol use: No  . Drug use: No  . Sexual activity: Never  Other Topics Concern  . Not on file  Social History Narrative  . Not on file   Social Determinants of Health   Financial Resource Strain:   . Difficulty of Paying Living Expenses:   Food Insecurity:   . Worried About Charity fundraiser in the Last Year:   . Arboriculturist in the Last Year:   Transportation Needs:   . Consulting civil engineer (Medical):   Marland Kitchen Lack of Transportation (Non-Medical):   Physical Activity:   . Days of Exercise per Week:   . Minutes of Exercise per Session:   Stress:   . Feeling of Stress :   Social Connections:   . Frequency of Communication with Friends and Family:   . Frequency of Social Gatherings with Friends and Family:   . Attends Religious Services:   . Active Member of Clubs or Organizations:   . Attends Archivist Meetings:   Marland Kitchen Marital Status:   Intimate Partner Violence:   . Fear of Current or Ex-Partner:   . Emotionally Abused:   Marland Kitchen Physically Abused:   . Sexually Abused:      Family History  Problem Relation Age of Onset  . Breast cancer Neg Hx      Review of Systems Positive for presyncope shortness of breath PND orthopnea Negative for: General:  chills, fever, night sweats or weight changes.  Cardiovascular: Positive for PND orthopnea syncope dizziness  Dermatological skin lesions rashes Respiratory: Cough congestion Urologic: Frequent urination urination at night and hematuria Abdominal: negative for nausea, vomiting, diarrhea, bright red blood per rectum, melena, or hematemesis Neurologic: negative for visual changes, and/or hearing changes  All other systems reviewed and are otherwise negative except as noted above.  Labs: No results for input(s): CKTOTAL, CKMB, TROPONINI in the last 72 hours. Lab Results  Component Value Date   WBC 7.7 05/09/2020   HGB 12.7 05/09/2020   HCT 38.3 05/09/2020   MCV 93.6 05/09/2020   PLT 249 05/09/2020    Recent Labs  Lab 05/09/20 1546 05/09/20 1821 05/10/20 0514  NA   < >  --  134*  K   < >  --  3.0*  CL   < >  --  94*  CO2   < >  --  30  BUN   < >  --  27*  CREATININE   < >  --  0.95  CALCIUM   < >  --  9.6  PROT  --  7.0  --  BILITOT  --  0.7  --   ALKPHOS  --  52  --   ALT  --  19  --   AST  --  22  --   GLUCOSE   < >  --  109*   < > = values in this interval not displayed.   No  results found for: CHOL, HDL, LDLCALC, TRIG No results found for: DDIMER  Radiology/Studies:  DG Chest 2 View  Result Date: 05/09/2020 CLINICAL DATA:  Lower extremity edema for 3 days, short of breath EXAM: CHEST - 2 VIEW COMPARISON:  03/08/2019 FINDINGS: Frontal and lateral views of the chest demonstrates stable enlargement of the cardiac silhouette. Calcified granuloma left apex unchanged. Chronic central vascular congestion without airspace disease, effusion, or pneumothorax. Background interstitial prominence consistent with emphysema unchanged. No acute bony abnormalities. IMPRESSION: 1. Stable exam, no acute process. Electronically Signed   By: Randa Ngo M.D.   On: 05/09/2020 16:55   CT Head Wo Contrast  Result Date: 05/09/2020 CLINICAL DATA:  Left-sided facial droop. EXAM: CT HEAD WITHOUT CONTRAST TECHNIQUE: Contiguous axial images were obtained from the base of the skull through the vertex without intravenous contrast. COMPARISON:  March 30th 2020 FINDINGS: Brain: No evidence of acute infarction, hemorrhage, hydrocephalus, extra-axial collection or mass lesion/mass effect. Again noted is significant encephalomalacia with advanced chronic microvascular ischemic changes. There is right frontal lobe encephalomalacia again noted. Vascular: No hyperdense vessel or unexpected calcification. Skull: Normal. Negative for fracture or focal lesion. Sinuses/Orbits: No acute finding. Other: None. IMPRESSION: No acute intracranial abnormality. Electronically Signed   By: Constance Holster M.D.   On: 05/09/2020 19:26   ECHOCARDIOGRAM COMPLETE  Result Date: 05/10/2020    ECHOCARDIOGRAM REPORT   Patient Name:   CLARETHA TOWNSHEND Date of Exam: 05/10/2020 Medical Rec #:  [id number]        Height:       59.0 in Accession #:    [id number]       Weight:       132.0 lb Date of Birth:  1928-03-06        BSA:          1.546 m Patient Age:    32 years         BP:           153/59 mmHg Patient Gender: F                HR:            51 bpm. Exam Location:  [facility] Procedure: 2D Echo, Color Doppler, Cardiac Doppler and Intracardiac            Opacification Agent Indications:     I50.31 CHF-Acute Diastolic  History:         Patient has prior history of Echocardiogram examinations. Risk                  Factors:Hypertension, Dyslipidemia and Diabetes. Hx of DVT.  Sonographer:     Charmayne Sheer RDCS (AE) Referring Phys:  [id number] [place] Diagnosing Phys: Serafina Royals MD  Sonographer Comments: Technically difficult study due to poor echo windows. Image acquisition challenging due to patient body habitus. IMPRESSIONS  1. Left ventricular ejection fraction, by estimation, is 55 to 60%. The left ventricle has normal function. The left ventricle has no regional wall motion abnormalities. Left ventricular diastolic parameters were normal.  2. Right ventricular systolic function is normal. The right ventricular size is normal.  There is mildly elevated pulmonary artery systolic pressure.  3. Left atrial size was mildly dilated.  4. The mitral valve is normal in structure. Mild to moderate mitral valve regurgitation.  5. The aortic valve is normal in structure. Aortic valve regurgitation is trivial. Mild aortic valve stenosis. FINDINGS  Left Ventricle: Left ventricular ejection fraction, by estimation, is 55 to 60%. The left ventricle has normal function. The left ventricle has no regional wall motion abnormalities. Definity contrast agent was given IV to delineate the left ventricular  endocardial borders. The left ventricular internal cavity size was normal in size. There is no left ventricular hypertrophy. Left ventricular diastolic parameters were normal. Right Ventricle: The right ventricular size is normal. No increase in right ventricular wall thickness. Right ventricular systolic function is normal. There is mildly elevated pulmonary artery systolic pressure. The tricuspid regurgitant velocity is 2.88  m/s, and with an assumed right atrial  pressure of 10 mmHg, the estimated right ventricular systolic pressure is 56.3 mmHg. Left Atrium: Left atrial size was mildly dilated. Right Atrium: Right atrial size was normal in size. Pericardium: There is no evidence of pericardial effusion. Mitral Valve: The mitral valve is normal in structure. Mild to moderate mitral valve regurgitation. MV peak gradient, 11.8 mmHg. The mean mitral valve gradient is 3.0 mmHg. Tricuspid Valve: The tricuspid valve is normal in structure. Tricuspid valve regurgitation is mild. Aortic Valve: The aortic valve is normal in structure. Aortic valve regurgitation is trivial. Mild aortic stenosis is present. Aortic valve mean gradient measures 8.0 mmHg. Aortic valve peak gradient measures 13.8 mmHg. Aortic valve area, by VTI measures  1.35 cm. Pulmonic Valve: The pulmonic valve was normal in structure. Pulmonic valve regurgitation is not visualized. Aorta: The aortic root and ascending aorta are structurally normal, with no evidence of dilitation. IAS/Shunts: No atrial level shunt detected by color flow Doppler.  LEFT VENTRICLE PLAX 2D LVIDd:         4.51 cm  Diastology LVIDs:         3.04 cm  LV e' lateral:   4.13 cm/s LV PW:         1.00 cm  LV E/e' lateral: 39.1 LV IVS:        0.75 cm  LV e' medial:    6.64 cm/s LVOT diam:     2.00 cm  LV E/e' medial:  24.3 LV SV:         62 LV SV Index:   40 LVOT Area:     3.14 cm  RIGHT VENTRICLE RV Basal diam:  3.23 cm LEFT ATRIUM           Index       RIGHT ATRIUM           Index LA diam:      4.30 cm 2.78 cm/m  RA Area:     15.00 cm LA Vol (A4C): 80.8 ml 52.27 ml/m RA Volume:   33.50 ml  21.67 ml/m  AORTIC VALVE                    PULMONIC VALVE AV Area (Vmax):    1.64 cm     PV Vmax:       1.02 m/s AV Area (Vmean):   1.54 cm     PV Vmean:      62.500 cm/s AV Area (VTI):     1.35 cm     PV VTI:        0.189 m  AV Vmax:           186.00 cm/s  PV Peak grad:  4.2 mmHg AV Vmean:          138.000 cm/s PV Mean grad:  2.0 mmHg AV VTI:             0.455 m AV Peak Grad:      13.8 mmHg AV Mean Grad:      8.0 mmHg LVOT Vmax:         96.90 cm/s LVOT Vmean:        67.500 cm/s LVOT VTI:          0.196 m LVOT/AV VTI ratio: 0.43  AORTA Ao Root diam: 2.60 cm MITRAL VALVE                TRICUSPID VALVE MV Area (PHT): 1.65 cm     TR Peak grad:   33.2 mmHg MV Peak grad:  11.8 mmHg    TR Vmax:        288.00 cm/s MV Mean grad:  3.0 mmHg MV Vmax:       1.72 m/s     SHUNTS MV Vmean:      78.1 cm/s    Systemic VTI:  0.20 m MV Decel Time: 460 msec     Systemic Diam: 2.00 cm MV E velocity: 161.33 cm/s Serafina Royals MD Electronically signed by Serafina Royals MD Signature Date/Time: 05/10/2020/1:47:32 PM    Final     EKG: Atrial fibrillation with slow ventricular rate and left axis deviation with right bundle branch block  Weights: Filed Weights   05/10/20 0018 05/10/20 0308 05/10/20 1117  Weight: 59.4 kg 59.9 kg 55.5 kg     Physical Exam: Blood pressure (!) 168/78, pulse 85, temperature 97.8 F (36.6 C), temperature source Oral, resp. rate 20, height 4\' 11"  (1.499 m), weight 55.5 kg, SpO2 96 %. Body mass index is 24.71 kg/m. General: Well developed, well nourished, in no acute distress. Head eyes ears nose throat: Normocephalic, atraumatic, sclera non-icteric, no xanthomas, nares are without discharge. No apparent thyromegaly and/or mass  Lungs: Normal respiratory effort.  no wheezes, few basilar rales, no rhonchi.  Heart: Irregular with normal S1 S2. no murmur gallop, no rub, PMI is normal size and placement, carotid upstroke normal without bruit, jugular venous pressure is normal Abdomen: Soft, non-tender, non-distended with normoactive bowel sounds. No hepatomegaly. No rebound/guarding. No obvious abdominal masses. Abdominal aorta is normal size without bruit Extremities: 1+ edema. no cyanosis, no clubbing, no ulcers  Peripheral : 2+ bilateral upper extremity pulses, 2+ bilateral femoral pulses, 2+ bilateral dorsal pedal pulse Neuro: Alert and  oriented. No facial asymmetry. No focal deficit. Moves all extremities spontaneously. Musculoskeletal: Normal muscle tone without kyphosis Psych:  Responds to questions appropriately with a normal affect.    Assessment: 86 year old female with chronic atrial fibrillation with slow ventricular rate acute on chronic diastolic dysfunction congestive heart failure diabetes hypertension hyperlipidemia coronary artery disease chronic kidney disease with no current evidence of myocardial infarction  Plan: 1.  Abstain from any beta-blocker or medication that can slow her heart rate down watching closely and her telemetry to assess for the possibility of sick sinus syndrome and need for further intervention 2.  Continue anticoagulation at this time for further evaluation and treatment options of and atrial fibrillation and risk reduction in stroke 3.  Continue intravenous Lasix as well as metolazone for further treatment of acute on chronic diastolic dysfunction heart failure 4.  Further  diagnostic testing and treatment options after above  Signed, Corey Skains M.D. [facility] 05/10/2020, 6:10 PM

## 2020-05-10 NOTE — Progress Notes (Signed)
*  PRELIMINARY RESULTS* Echocardiogram 2D Echocardiogram has been performed.  Rachael Horne 05/10/2020, 10:04 AM

## 2020-05-10 NOTE — Progress Notes (Signed)
PROGRESS NOTE    Rachael Horne  WYO:[id number] DOB: [birth year] DOA: 05/09/2020 PCP: Idelle Crouch, MD   Assessment & Plan:   Active Problems:   Acute on chronic combined systolic and diastolic CHF (congestive heart failure) (HCC)  Asystole: approx 15 sec while pt was sitting on toilet & now bradycardic. CPR was not required as per pt's nurse. Transferred to stepdown and cardio notified. Pacer pads in place   Acute on chronic diastolic CHF exacerbation: continue on IV lasix & metolazone. Monitor I/Os. Fluid restriction. Echo on 03/09/2019 showed EF 60-65%. Continue on tele. Cardio consulted  HTN: continue on metolazone. Hold diltiazem secondary to bradycardia  PAF: continue on eliquis. Hold diltiazem secondary to bradycardia  Hypokalemia: KCl repleted. Will continue to monitor   DM2: continue on SSI w/ accuchecks.  DVT prophylaxis: eliquis  Code Status: full  Family Communication: discussed pt's care w/ pt's son, [name], and answered his questions. [name] wants to continue w/ FULL code Disposition Plan: depends on PT/OT recs (not consulted yet, cardio needs to eval pt)   Consultants:   Cardio    Procedures:    Antimicrobials:   Subjective: Pt c/o weakness & dizziness  Objective: Vitals:   05/09/20 2255 05/10/20 0018 05/10/20 0308 05/10/20 0741  BP: (!) 168/77 (!) 166/54 (!) 162/57 (!) 153/59  Pulse: 85 90 (!) 57 (!) 57  Resp: 20 19  16   Temp:  97.8 F (36.6 C) 97.7 F (36.5 C) 97.6 F (36.4 C)  TempSrc:  Oral Oral Oral  SpO2: 98% 98% 98% 96%  Weight:  59.4 kg 59.9 kg   Height:  4\' 11"  (1.499 m)      Intake/Output Summary (Last 24 hours) at 05/10/2020 0830 Last data filed at 05/10/2020 0551 Gross per 24 hour  Intake --  Output 400 ml  Net -400 ml   Filed Weights   05/09/20 1545 05/10/20 0018 05/10/20 0308  Weight: 60.8 kg 59.4 kg 59.9 kg    Examination:  General exam: Appears calm and comfortable  Respiratory system: Clear to auscultation. No  rales, rhonchi  Cardiovascular system: bradycardia. No rubs, gallops or clicks.  Gastrointestinal system: Abdomen is nondistended, soft and nontender. Normal bowel sounds heard. Central nervous system: Alert and oriented. Moves all 4 extremities  Psychiatry: Judgement and insight appear normal. Flat mood and affect    Data Reviewed: I have personally reviewed following labs and imaging studies  CBC: Recent Labs  Lab 05/09/20 1546  WBC 7.7  HGB 12.7  HCT 38.3  MCV 93.6  PLT 774   Basic Metabolic Panel: Recent Labs  Lab 05/09/20 1546 05/10/20 0514  NA 131* 134*  K 3.6 3.0*  CL 92* 94*  CO2 29 30  GLUCOSE 108* 109*  BUN 35* 27*  CREATININE 1.10* 0.95  CALCIUM 9.4 9.6  MG 2.1  --    GFR: Estimated Creatinine Clearance: 31 mL/min (by C-G formula based on SCr of 0.95 mg/dL). Liver Function Tests: Recent Labs  Lab 05/09/20 1821  AST 22  ALT 19  ALKPHOS 52  BILITOT 0.7  PROT 7.0  ALBUMIN 4.0   No results for input(s): LIPASE, AMYLASE in the last 168 hours. No results for input(s): AMMONIA in the last 168 hours. Coagulation Profile: No results for input(s): INR, PROTIME in the last 168 hours. Cardiac Enzymes: No results for input(s): CKTOTAL, CKMB, CKMBINDEX, TROPONINI in the last 168 hours. BNP (last 3 results) No results for input(s): PROBNP in the last 8760 hours. HbA1C: No  results for input(s): HGBA1C in the last 72 hours. CBG: Recent Labs  Lab 05/10/20 0744  GLUCAP 109*   Lipid Profile: No results for input(s): CHOL, HDL, LDLCALC, TRIG, CHOLHDL, LDLDIRECT in the last 72 hours. Thyroid Function Tests: Recent Labs    05/09/20 1546  TSH 9.584*   Anemia Panel: No results for input(s): VITAMINB12, FOLATE, FERRITIN, TIBC, IRON, RETICCTPCT in the last 72 hours. Sepsis Labs: No results for input(s): PROCALCITON, LATICACIDVEN in the last 168 hours.  Recent Results (from the past 240 hour(s))  SARS Coronavirus 2 by RT PCR (hospital order, performed in  [facility] hospital lab) Nasopharyngeal Nasopharyngeal Swab     Status: None   Collection Time: 05/09/20  8:36 PM   Specimen: Nasopharyngeal Swab  Result Value Ref Range Status   SARS Coronavirus 2 NEGATIVE NEGATIVE Final    Comment: (NOTE) SARS-CoV-2 target nucleic acids are NOT DETECTED. The SARS-CoV-2 RNA is generally detectable in upper and lower respiratory specimens during the acute phase of infection. The lowest concentration of SARS-CoV-2 viral copies this assay can detect is 250 copies / mL. A negative result does not preclude SARS-CoV-2 infection and should not be used as the sole basis for treatment or other patient management decisions.  A negative result may occur with improper specimen collection / handling, submission of specimen other than nasopharyngeal swab, presence of viral mutation(s) within the areas targeted by this assay, and inadequate number of viral copies (<250 copies / mL). A negative result must be combined with clinical observations, patient history, and epidemiological information. Fact Sheet for Patients:   StrictlyIdeas.no Fact Sheet for Healthcare Providers: BankingDealers.co.za This test is not yet approved or cleared  by the [place] FDA and has been authorized for detection and/or diagnosis of SARS-CoV-2 by FDA under an Emergency Use Authorization (EUA).  This EUA will remain in effect (meaning this test can be used) for the duration of the COVID-19 declaration under Section 564(b)(1) of the Act, 21 U.S.C. section 360bbb-3(b)(1), unless the authorization is terminated or revoked sooner. Performed at [facility], [address] [place], [place] [address]          Radiology Studies: DG Chest 2 View  Result Date: 05/09/2020 CLINICAL DATA:  Lower extremity edema for 3 days, short of breath EXAM: CHEST - 2 VIEW COMPARISON:  03/08/2019 FINDINGS: Frontal and lateral views of the  chest demonstrates stable enlargement of the cardiac silhouette. Calcified granuloma left apex unchanged. Chronic central vascular congestion without airspace disease, effusion, or pneumothorax. Background interstitial prominence consistent with emphysema unchanged. No acute bony abnormalities. IMPRESSION: 1. Stable exam, no acute process. Electronically Signed   By: Randa Ngo M.D.   On: 05/09/2020 16:55   CT Head Wo Contrast  Result Date: 05/09/2020 CLINICAL DATA:  Left-sided facial droop. EXAM: CT HEAD WITHOUT CONTRAST TECHNIQUE: Contiguous axial images were obtained from the base of the skull through the vertex without intravenous contrast. COMPARISON:  March 30th 2020 FINDINGS: Brain: No evidence of acute infarction, hemorrhage, hydrocephalus, extra-axial collection or mass lesion/mass effect. Again noted is significant encephalomalacia with advanced chronic microvascular ischemic changes. There is right frontal lobe encephalomalacia again noted. Vascular: No hyperdense vessel or unexpected calcification. Skull: Normal. Negative for fracture or focal lesion. Sinuses/Orbits: No acute finding. Other: None. IMPRESSION: No acute intracranial abnormality. Electronically Signed   By: Constance Holster M.D.   On: 05/09/2020 19:26        Scheduled Meds: . apixaban  2.5 mg Oral BID  .  aspirin EC  81 mg Oral Daily  . calcium carbonate  1 tablet Oral BID  . diltiazem  240 mg Oral Daily  . DULoxetine  30 mg Oral Daily  . furosemide  40 mg Intravenous Q12H  . gabapentin  100 mg Oral QHS  . hydrALAZINE  25 mg Oral Q8H  . insulin aspart  0-15 Units Subcutaneous TID PC & HS  . magnesium oxide  400 mg Oral Q1200  . [START ON 05/11/2020] metolazone  2.5 mg Oral Once per day on Mon Wed Fri  . montelukast  10 mg Oral QHS  . multivitamin with minerals  1 tablet Oral Q1200  . pantoprazole  40 mg Oral Daily  . potassium chloride  20 mEq Oral Daily  . potassium chloride  40 mEq Oral Q4H  . pravastatin  40  mg Oral q1800  . sodium chloride flush  3 mL Intravenous Once  . sodium chloride flush  3 mL Intravenous Q12H   Continuous Infusions: . sodium chloride       LOS: 1 day    Time spent: 33 mins    Wyvonnia Dusky, MD [facility] Pager [phone]  If 7PM-7AM, please contact night-coverage [email or web address] 05/10/2020, 8:30 AM

## 2020-05-10 NOTE — Progress Notes (Signed)
After checking patient blood glucose at lunch time, patient refused the sliding scale insulin coverage.  I explained the purpose of insulin replacement therapy and the danger to the body system d/t an imbalance of metabolites during energy needs.  Physician notified.

## 2020-05-10 NOTE — Progress Notes (Signed)
Cross Cover Brief Note Potassium low this am.  Replaced orally and daily potassium supplementation  Ordered.  Mag level 2.1 yesterday, Recheck ordered for am

## 2020-05-10 NOTE — Plan of Care (Signed)
  Problem: Education: Goal: Knowledge of General Education information will improve Description: Including pain rating scale, medication(s)/side effects and non-pharmacologic comfort measures Outcome: Progressing Note: Patient admitted without any complications. POCT and REDs test performed. No skin abnormalities. Profile completed via SWOT.

## 2020-05-10 NOTE — Progress Notes (Addendum)
Pt transferred to rm 18 in ICU. Patients son [name] made aware of transfer.

## 2020-05-10 NOTE — Progress Notes (Addendum)
Pt on commode having a bowel movement. Pts HR decreased to the 20's and went asystole for approx 15 seconds. Pts eyes "glossed over" and rolled to back of head and went unresponsive. Pt able to answer questions approx 2 minutes after and HR elevated to the 40's. VS currently stable. Pt very lethargic and drowsy. MD made aware. Zoll pads placed.

## 2020-05-10 NOTE — Progress Notes (Signed)
[place] visited pt. briefly after observing pt. struggling to reach phone on bedside tray; [place] helped pt. call her son on phone.  No further needs at this time; pt. grateful for [facility] visit.    05/10/20 1500  Clinical Encounter Type  Visited With Patient  Visit Type Initial  Referral From Patient

## 2020-05-11 DIAGNOSIS — R55 Syncope and collapse: Secondary | ICD-10-CM

## 2020-05-11 DIAGNOSIS — R7989 Other specified abnormal findings of blood chemistry: Secondary | ICD-10-CM

## 2020-05-11 LAB — CBC
HCT: 37.8 % (ref 36.0–46.0)
Hemoglobin: 13.4 g/dL (ref 12.0–15.0)
MCH: 32 pg (ref 26.0–34.0)
MCHC: 35.4 g/dL (ref 30.0–36.0)
MCV: 90.2 fL (ref 80.0–100.0)
Platelets: 241 10*3/uL (ref 150–400)
RBC: 4.19 MIL/uL (ref 3.87–5.11)
RDW: 13.7 % (ref 11.5–15.5)
WBC: 9.4 10*3/uL (ref 4.0–10.5)
nRBC: 0 % (ref 0.0–0.2)

## 2020-05-11 LAB — BASIC METABOLIC PANEL
Anion gap: 9 (ref 5–15)
BUN: 27 mg/dL — ABNORMAL HIGH (ref 8–23)
CO2: 29 mmol/L (ref 22–32)
Calcium: 9.6 mg/dL (ref 8.9–10.3)
Chloride: 97 mmol/L — ABNORMAL LOW (ref 98–111)
Creatinine, Ser: 1.05 mg/dL — ABNORMAL HIGH (ref 0.44–1.00)
GFR calc Af Amer: 54 mL/min — ABNORMAL LOW (ref >60)
GFR calc non Af Amer: 47 mL/min — ABNORMAL LOW (ref >60)
Glucose, Bld: 131 mg/dL — ABNORMAL HIGH (ref 70–99)
Potassium: 3.9 mmol/L (ref 3.5–5.1)
Sodium: 135 mmol/L (ref 135–145)

## 2020-05-11 LAB — GLUCOSE, CAPILLARY
Glucose-Capillary: 111 mg/dL — ABNORMAL HIGH (ref 70–99)
Glucose-Capillary: 153 mg/dL — ABNORMAL HIGH (ref 70–99)
Glucose-Capillary: 137 mg/dL — ABNORMAL HIGH (ref 70–99)
Glucose-Capillary: 138 mg/dL — ABNORMAL HIGH (ref 70–99)

## 2020-05-11 LAB — MAGNESIUM: Magnesium: 2 mg/dL (ref 1.7–2.4)

## 2020-05-11 MED ORDER — MUPIROCIN 2 % EX OINT
1.0000 "application " | TOPICAL_OINTMENT | Freq: Two times a day (BID) | CUTANEOUS | Status: DC
  Administered 2020-05-11 – 2020-05-12 (×2): 1 via NASAL
  Filled 2020-05-11: qty 22

## 2020-05-11 MED ORDER — CHLORHEXIDINE GLUCONATE CLOTH 2 % EX PADS
6.0000 | MEDICATED_PAD | Freq: Every day | CUTANEOUS | Status: DC
  Administered 2020-05-12: 6 via TOPICAL

## 2020-05-11 MED ORDER — FUROSEMIDE 10 MG/ML IJ SOLN
40.0000 mg | Freq: Every day | INTRAMUSCULAR | Status: DC
  Administered 2020-05-12: 40 mg via INTRAVENOUS
  Filled 2020-05-11: qty 4

## 2020-05-11 MED ORDER — LOSARTAN POTASSIUM 50 MG PO TABS
50.0000 mg | ORAL_TABLET | Freq: Every day | ORAL | Status: DC
  Administered 2020-05-11 – 2020-05-12 (×2): 50 mg via ORAL
  Filled 2020-05-11 (×2): qty 1

## 2020-05-11 MED ORDER — CHLORHEXIDINE GLUCONATE CLOTH 2 % EX PADS: 6.0000 | MEDICATED_PAD | Freq: Every day | CUTANEOUS | Status: DC

## 2020-05-11 NOTE — Progress Notes (Signed)
Patients maintains AOx4.  Cardiology and Hospitalist rounded today.  Patient orders to return to PCU on cardiac monitor. Her breath sounds are diminished. She has consumed 90% or her meals.  She got up from bed with PT and walked with walker around the back side of the unit.  She then was helped to her chair at bedside.  Assisted patient to the [facility].  She received a CHG bath this morning.  HTN during the later part of shift.  Delivered 10mg  Hydralazine PRN per order.  SBP returned to the low 150's.  Son visted today for the afternoon. Patient is now tucked into bed and sleeping well.

## 2020-05-11 NOTE — Progress Notes (Signed)
[facility] Encounter Note  Patient: Rachael Horne / Admit Date: 05/09/2020 / Date of Encounter: 05/11/2020, 8:56 AM   Subjective: Patient feeling much better today.  Less shortness of breath but some continued fatigue.  Patient does have continued atrial fibrillation by telemetry but no evidence of sick sinus syndrome or significant symptomatic bradycardia.  Patient had a vagal episode yesterday with a very slow heart rate but no evidence of significant further event.  Patient does have some slight amount of rails or crackles consistent with needing further diuresis of diastolic dysfunction heart failure Echocardiogram with normal LV systolic function and no evidence of significant valvular heart disease. No current evidence of myocardial infarction  Review of Systems: Positive for: Shortness of breath Negative for: Vision change, hearing change, syncope, dizziness, nausea, vomiting,diarrhea, bloody stool, stomach pain, cough, congestion, diaphoresis, urinary frequency, urinary pain,skin lesions, skin rashes Others previously listed  Objective: Telemetry: Atrial fibrillation with controlled ventricular rate Physical Exam: Blood pressure (!) 117/95, pulse 80, temperature 98.7 F (37.1 C), temperature source Oral, resp. rate 20, height 4\' 11"  (1.499 m), weight 56.1 kg, SpO2 98 %. Body mass index is 24.98 kg/m. General: Well developed, well nourished, in no acute distress. Head: Normocephalic, atraumatic, sclera non-icteric, no xanthomas, nares are without discharge. Neck: No apparent masses Lungs: Normal respirations with no wheezes, no rhonchi, no rales , basilar crackles   Heart: Irregular rate and rhythm, normal S1 S2, aortic murmur, no rub, no gallop, PMI is normal size and placement, carotid upstroke normal without bruit, jugular venous pressure normal Abdomen: Soft, non-tender, non-distended with normoactive bowel sounds. No hepatosplenomegaly. Abdominal aorta is  normal size without bruit Extremities: Trace edema, no clubbing, no cyanosis, no ulcers,  Peripheral: 2+ radial, 2+ femoral, 2+ dorsal pedal pulses Neuro: Alert and oriented. Moves all extremities spontaneously. Psych:  Responds to questions appropriately with a normal affect.   Intake/Output Summary (Last 24 hours) at 05/11/2020 0856 Last data filed at 05/11/2020 0300 Gross per 24 hour  Intake 240 ml  Output 750 ml  Net -510 ml    Inpatient Medications:  . apixaban  2.5 mg Oral BID  . aspirin EC  81 mg Oral Daily  . calcium carbonate  1 tablet Oral BID  . Chlorhexidine Gluconate Cloth  6 each Topical Daily  . DULoxetine  30 mg Oral Daily  . furosemide  40 mg Intravenous Q12H  . gabapentin  100 mg Oral QHS  . hydrALAZINE  25 mg Oral Q8H  . insulin aspart  0-15 Units Subcutaneous TID PC & HS  . magnesium oxide  400 mg Oral Q1200  . metolazone  2.5 mg Oral Once per day on Mon Wed Fri  . montelukast  10 mg Oral QHS  . multivitamin with minerals  1 tablet Oral Q1200  . pantoprazole  40 mg Oral Daily  . potassium chloride  20 mEq Oral Daily  . pravastatin  40 mg Oral q1800  . sodium chloride flush  3 mL Intravenous Once  . sodium chloride flush  3 mL Intravenous Q12H   Infusions:  . sodium chloride      Labs: Recent Labs    05/09/20 1546 05/09/20 1546 05/10/20 0514 05/11/20 0338  NA 131*   < > 134* 135  K 3.6   < > 3.0* 3.9  CL 92*   < > 94* 97*  CO2 29   < > 30 29  GLUCOSE 108*   < > 109* 131*  BUN  35*   < > 27* 27*  CREATININE 1.10*   < > 0.95 1.05*  CALCIUM 9.4   < > 9.6 9.6  MG 2.1  --   --  2.0   < > = values in this interval not displayed.   Recent Labs    05/09/20 1821  AST 22  ALT 19  ALKPHOS 52  BILITOT 0.7  PROT 7.0  ALBUMIN 4.0   Recent Labs    05/09/20 1546 05/11/20 0338  WBC 7.7 9.4  HGB 12.7 13.4  HCT 38.3 37.8  MCV 93.6 90.2  PLT 249 241   No results for input(s): CKTOTAL, CKMB, TROPONINI in the last 72 hours. Invalid input(s):  POCBNP Recent Labs    05/10/20 0514  HGBA1C 6.8*     Weights: Filed Weights   05/10/20 0308 05/10/20 1117 05/11/20 0343  Weight: 59.9 kg 55.5 kg 56.1 kg     Radiology/Studies:  DG Chest 2 View  Result Date: 05/09/2020 CLINICAL DATA:  Lower extremity edema for 3 days, short of breath EXAM: CHEST - 2 VIEW COMPARISON:  03/08/2019 FINDINGS: Frontal and lateral views of the chest demonstrates stable enlargement of the cardiac silhouette. Calcified granuloma left apex unchanged. Chronic central vascular congestion without airspace disease, effusion, or pneumothorax. Background interstitial prominence consistent with emphysema unchanged. No acute bony abnormalities. IMPRESSION: 1. Stable exam, no acute process. Electronically Signed   By: Randa Ngo M.D.   On: 05/09/2020 16:55   CT Head Wo Contrast  Result Date: 05/09/2020 CLINICAL DATA:  Left-sided facial droop. EXAM: CT HEAD WITHOUT CONTRAST TECHNIQUE: Contiguous axial images were obtained from the base of the skull through the vertex without intravenous contrast. COMPARISON:  March 30th 2020 FINDINGS: Brain: No evidence of acute infarction, hemorrhage, hydrocephalus, extra-axial collection or mass lesion/mass effect. Again noted is significant encephalomalacia with advanced chronic microvascular ischemic changes. There is right frontal lobe encephalomalacia again noted. Vascular: No hyperdense vessel or unexpected calcification. Skull: Normal. Negative for fracture or focal lesion. Sinuses/Orbits: No acute finding. Other: None. IMPRESSION: No acute intracranial abnormality. Electronically Signed   By: Constance Holster M.D.   On: 05/09/2020 19:26   ECHOCARDIOGRAM COMPLETE  Result Date: 05/10/2020    ECHOCARDIOGRAM REPORT   Patient Name:   Rachael Horne Date of Exam: 05/10/2020 Medical Rec #:  [id number]        Height:       59.0 in Accession #:    [id number]       Weight:       132.0 lb Date of Birth:  1928-09-25        BSA:          1.546 m  Patient Age:    86 years         BP:           153/59 mmHg Patient Gender: F                HR:           51 bpm. Exam Location:  [facility] Procedure: 2D Echo, Color Doppler, Cardiac Doppler and Intracardiac            Opacification Agent Indications:     I50.31 CHF-Acute Diastolic  History:         Patient has prior history of Echocardiogram examinations. Risk                  Factors:Hypertension, Dyslipidemia and Diabetes. Hx of DVT.  Sonographer:  Charmayne Sheer RDCS (AE) Referring Phys:  [id number] Arvella Merles Bray Diagnosing Phys: Serafina Royals MD  Sonographer Comments: Technically difficult study due to poor echo windows. Image acquisition challenging due to patient body habitus. IMPRESSIONS  1. Left ventricular ejection fraction, by estimation, is 55 to 60%. The left ventricle has normal function. The left ventricle has no regional wall motion abnormalities. Left ventricular diastolic parameters were normal.  2. Right ventricular systolic function is normal. The right ventricular size is normal. There is mildly elevated pulmonary artery systolic pressure.  3. Left atrial size was mildly dilated.  4. The mitral valve is normal in structure. Mild to moderate mitral valve regurgitation.  5. The aortic valve is normal in structure. Aortic valve regurgitation is trivial. Mild aortic valve stenosis. FINDINGS  Left Ventricle: Left ventricular ejection fraction, by estimation, is 55 to 60%. The left ventricle has normal function. The left ventricle has no regional wall motion abnormalities. Definity contrast agent was given IV to delineate the left ventricular  endocardial borders. The left ventricular internal cavity size was normal in size. There is no left ventricular hypertrophy. Left ventricular diastolic parameters were normal. Right Ventricle: The right ventricular size is normal. No increase in right ventricular wall thickness. Right ventricular systolic function is normal. There is mildly elevated pulmonary artery  systolic pressure. The tricuspid regurgitant velocity is 2.88  m/s, and with an assumed right atrial pressure of 10 mmHg, the estimated right ventricular systolic pressure is 51.7 mmHg. Left Atrium: Left atrial size was mildly dilated. Right Atrium: Right atrial size was normal in size. Pericardium: There is no evidence of pericardial effusion. Mitral Valve: The mitral valve is normal in structure. Mild to moderate mitral valve regurgitation. MV peak gradient, 11.8 mmHg. The mean mitral valve gradient is 3.0 mmHg. Tricuspid Valve: The tricuspid valve is normal in structure. Tricuspid valve regurgitation is mild. Aortic Valve: The aortic valve is normal in structure. Aortic valve regurgitation is trivial. Mild aortic stenosis is present. Aortic valve mean gradient measures 8.0 mmHg. Aortic valve peak gradient measures 13.8 mmHg. Aortic valve area, by VTI measures  1.35 cm. Pulmonic Valve: The pulmonic valve was normal in structure. Pulmonic valve regurgitation is not visualized. Aorta: The aortic root and ascending aorta are structurally normal, with no evidence of dilitation. IAS/Shunts: No atrial level shunt detected by color flow Doppler.  LEFT VENTRICLE PLAX 2D LVIDd:         4.51 cm  Diastology LVIDs:         3.04 cm  LV e' lateral:   4.13 cm/s LV PW:         1.00 cm  LV E/e' lateral: 39.1 LV IVS:        0.75 cm  LV e' medial:    6.64 cm/s LVOT diam:     2.00 cm  LV E/e' medial:  24.3 LV SV:         62 LV SV Index:   40 LVOT Area:     3.14 cm  RIGHT VENTRICLE RV Basal diam:  3.23 cm LEFT ATRIUM           Index       RIGHT ATRIUM           Index LA diam:      4.30 cm 2.78 cm/m  RA Area:     15.00 cm LA Vol (A4C): 80.8 ml 52.27 ml/m RA Volume:   33.50 ml  21.67 ml/m  AORTIC VALVE  PULMONIC VALVE AV Area (Vmax):    1.64 cm     PV Vmax:       1.02 m/s AV Area (Vmean):   1.54 cm     PV Vmean:      62.500 cm/s AV Area (VTI):     1.35 cm     PV VTI:        0.189 m AV Vmax:           186.00  cm/s  PV Peak grad:  4.2 mmHg AV Vmean:          138.000 cm/s PV Mean grad:  2.0 mmHg AV VTI:            0.455 m AV Peak Grad:      13.8 mmHg AV Mean Grad:      8.0 mmHg LVOT Vmax:         96.90 cm/s LVOT Vmean:        67.500 cm/s LVOT VTI:          0.196 m LVOT/AV VTI ratio: 0.43  AORTA Ao Root diam: 2.60 cm MITRAL VALVE                TRICUSPID VALVE MV Area (PHT): 1.65 cm     TR Peak grad:   33.2 mmHg MV Peak grad:  11.8 mmHg    TR Vmax:        288.00 cm/s MV Mean grad:  3.0 mmHg MV Vmax:       1.72 m/s     SHUNTS MV Vmean:      78.1 cm/s    Systemic VTI:  0.20 m MV Decel Time: 460 msec     Systemic Diam: 2.00 cm MV E velocity: 161.33 cm/s Serafina Royals MD Electronically signed by Serafina Royals MD Signature Date/Time: 05/10/2020/1:47:32 PM    Final      Assessment and Recommendation  86 y.o. female with acute on chronic diastolic dysfunction congestive heart failure hypertension hyperlipidemia chronic kidney disease with improvements with metolazone furosemide injection and an event of vagal slow heart rate resolved with no current evidence of sick sinus syndrome myocardial infarction 1.  No additional medication management for heart rate control or heart rate medications including beta-blocker due to concerns of sick sinus syndrome and slow heart rate and vagal syndrome 2.  Continue anticoagulation for further risk reduction in stroke with atrial fibrillation 3.  Continue furosemide metolazone for 1 more day and begin ambulation with physical rehabilitation and possible discharge to home tomorrow if ambulating well 4.  High intensity cholesterol therapy 5.  No further cardiac diagnostics necessary at this time 6.  Call if further questions  Signed, Serafina Royals M.D. FACC

## 2020-05-11 NOTE — Progress Notes (Signed)
[place] [facility] [employer] NAME: Rachael Horne    MR#:  [id number]  DATE OF BIRTH:  [birth year]  SUBJECTIVE:  patient doing very well. Denies any complaints. Able to complete sentence without getting shortness of breath. Her heart rate is in the 70s. Blood pressure stable.   REVIEW OF SYSTEMS:   Review of Systems  Constitutional: Negative for chills, fever and weight loss.  HENT: Negative for ear discharge, ear pain and nosebleeds.   Eyes: Negative for blurred vision, pain and discharge.  Respiratory: Negative for sputum production, shortness of breath, wheezing and stridor.   Cardiovascular: Positive for orthopnea and leg swelling. Negative for chest pain, palpitations and PND.  Gastrointestinal: Negative for abdominal pain, diarrhea, nausea and vomiting.  Genitourinary: Negative for frequency and urgency.  Musculoskeletal: Negative for back pain and joint pain.  Neurological: Positive for weakness. Negative for sensory change, speech change and focal weakness.  Psychiatric/Behavioral: Negative for depression and hallucinations. The patient is not nervous/anxious.    Tolerating Diet:yes Tolerating PT: HHPT  DRUG ALLERGIES:   Allergies  Allergen Reactions  . Amoxicillin Rash    VITALS:  Blood pressure (!) 156/61, pulse 70, temperature 98.7 F (37.1 C), temperature source Oral, resp. rate (!) 21, height 4\' 11"  (1.499 m), weight 56.1 kg, SpO2 97 %.  PHYSICAL EXAMINATION:   Physical Exam  GENERAL:  86 y.o.-year-old patient lying in the bed with no acute distress.  EYES: Pupils equal, round, reactive to light and accommodation. No scleral icterus.   HEENT: Head atraumatic, normocephalic. Oropharynx and nasopharynx clear.  NECK:  Supple, no jugular venous distention. No thyroid enlargement, no tenderness.  LUNGS: Normal breath sounds bilaterally, no wheezing, few rales, no rhonchi. No use of accessory muscles of respiration.   CARDIOVASCULAR: S1, S2 normal. No murmurs, rubs, or gallops.  ABDOMEN: Soft, nontender, nondistended. Bowel sounds present. No organomegaly or mass.  EXTREMITIES: No cyanosis, clubbing o ++edema b/l.    NEUROLOGIC: Cranial nerves II through XII are intact. No focal Motor or sensory deficits b/l.   PSYCHIATRIC:  patient is alert and oriented x 3.  SKIN: No obvious rash, lesion, or ulcer.   LABORATORY PANEL:  CBC Recent Labs  Lab 05/11/20 0338  WBC 9.4  HGB 13.4  HCT 37.8  PLT 241    Chemistries  Recent Labs  Lab 05/09/20 1821 05/10/20 0514 05/11/20 0338  NA  --    < > 135  K  --    < > 3.9  CL  --    < > 97*  CO2  --    < > 29  GLUCOSE  --    < > 131*  BUN  --    < > 27*  CREATININE  --    < > 1.05*  CALCIUM  --    < > 9.6  MG  --   --  2.0  AST 22  --   --   ALT 19  --   --   ALKPHOS 52  --   --   BILITOT 0.7  --   --    < > = values in this interval not displayed.   Cardiac Enzymes No results for input(s): TROPONINI in the last 168 hours. RADIOLOGY:  DG Chest 2 View  Result Date: 05/09/2020 CLINICAL DATA:  Lower extremity edema for 3 days, short of breath EXAM: CHEST - 2 VIEW COMPARISON:  03/08/2019 FINDINGS: Frontal and lateral views of the  chest demonstrates stable enlargement of the cardiac silhouette. Calcified granuloma left apex unchanged. Chronic central vascular congestion without airspace disease, effusion, or pneumothorax. Background interstitial prominence consistent with emphysema unchanged. No acute bony abnormalities. IMPRESSION: 1. Stable exam, no acute process. Electronically Signed   By: Randa Ngo M.D.   On: 05/09/2020 16:55   CT Head Wo Contrast  Result Date: 05/09/2020 CLINICAL DATA:  Left-sided facial droop. EXAM: CT HEAD WITHOUT CONTRAST TECHNIQUE: Contiguous axial images were obtained from the base of the skull through the vertex without intravenous contrast. COMPARISON:  March 30th 2020 FINDINGS: Brain: No evidence of acute infarction,  hemorrhage, hydrocephalus, extra-axial collection or mass lesion/mass effect. Again noted is significant encephalomalacia with advanced chronic microvascular ischemic changes. There is right frontal lobe encephalomalacia again noted. Vascular: No hyperdense vessel or unexpected calcification. Skull: Normal. Negative for fracture or focal lesion. Sinuses/Orbits: No acute finding. Other: None. IMPRESSION: No acute intracranial abnormality. Electronically Signed   By: Constance Holster M.D.   On: 05/09/2020 19:26   ECHOCARDIOGRAM COMPLETE  Result Date: 05/10/2020    ECHOCARDIOGRAM REPORT   Patient Name:   Rachael Horne Date of Exam: 05/10/2020 Medical Rec #:  [id number]        Height:       59.0 in Accession #:    [id number]       Weight:       132.0 lb Date of Birth:  1927-12-29        BSA:          1.546 m Patient Age:    86 years         BP:           153/59 mmHg Patient Gender: F                HR:           51 bpm. Exam Location:  [facility] Procedure: 2D Echo, Color Doppler, Cardiac Doppler and Intracardiac            Opacification Agent Indications:     I50.31 CHF-Acute Diastolic  History:         Patient has prior history of Echocardiogram examinations. Risk                  Factors:Hypertension, Dyslipidemia and Diabetes. Hx of DVT.  Sonographer:     Charmayne Sheer RDCS (AE) Referring Phys:  [id number] [place] Diagnosing Phys: Serafina Royals MD  Sonographer Comments: Technically difficult study due to poor echo windows. Image acquisition challenging due to patient body habitus. IMPRESSIONS  1. Left ventricular ejection fraction, by estimation, is 55 to 60%. The left ventricle has normal function. The left ventricle has no regional wall motion abnormalities. Left ventricular diastolic parameters were normal.  2. Right ventricular systolic function is normal. The right ventricular size is normal. There is mildly elevated pulmonary artery systolic pressure.  3. Left atrial size was mildly dilated.  4. The mitral valve  is normal in structure. Mild to moderate mitral valve regurgitation.  5. The aortic valve is normal in structure. Aortic valve regurgitation is trivial. Mild aortic valve stenosis. FINDINGS  Left Ventricle: Left ventricular ejection fraction, by estimation, is 55 to 60%. The left ventricle has normal function. The left ventricle has no regional wall motion abnormalities. Definity contrast agent was given IV to delineate the left ventricular  endocardial borders. The left ventricular internal cavity size was normal in size. There is no left ventricular hypertrophy. Left  ventricular diastolic parameters were normal. Right Ventricle: The right ventricular size is normal. No increase in right ventricular wall thickness. Right ventricular systolic function is normal. There is mildly elevated pulmonary artery systolic pressure. The tricuspid regurgitant velocity is 2.88  m/s, and with an assumed right atrial pressure of 10 mmHg, the estimated right ventricular systolic pressure is 08.1 mmHg. Left Atrium: Left atrial size was mildly dilated. Right Atrium: Right atrial size was normal in size. Pericardium: There is no evidence of pericardial effusion. Mitral Valve: The mitral valve is normal in structure. Mild to moderate mitral valve regurgitation. MV peak gradient, 11.8 mmHg. The mean mitral valve gradient is 3.0 mmHg. Tricuspid Valve: The tricuspid valve is normal in structure. Tricuspid valve regurgitation is mild. Aortic Valve: The aortic valve is normal in structure. Aortic valve regurgitation is trivial. Mild aortic stenosis is present. Aortic valve mean gradient measures 8.0 mmHg. Aortic valve peak gradient measures 13.8 mmHg. Aortic valve area, by VTI measures  1.35 cm. Pulmonic Valve: The pulmonic valve was normal in structure. Pulmonic valve regurgitation is not visualized. Aorta: The aortic root and ascending aorta are structurally normal, with no evidence of dilitation. IAS/Shunts: No atrial level shunt  detected by color flow Doppler.  LEFT VENTRICLE PLAX 2D LVIDd:         4.51 cm  Diastology LVIDs:         3.04 cm  LV e' lateral:   4.13 cm/s LV PW:         1.00 cm  LV E/e' lateral: 39.1 LV IVS:        0.75 cm  LV e' medial:    6.64 cm/s LVOT diam:     2.00 cm  LV E/e' medial:  24.3 LV SV:         62 LV SV Index:   40 LVOT Area:     3.14 cm  RIGHT VENTRICLE RV Basal diam:  3.23 cm LEFT ATRIUM           Index       RIGHT ATRIUM           Index LA diam:      4.30 cm 2.78 cm/m  RA Area:     15.00 cm LA Vol (A4C): 80.8 ml 52.27 ml/m RA Volume:   33.50 ml  21.67 ml/m  AORTIC VALVE                    PULMONIC VALVE AV Area (Vmax):    1.64 cm     PV Vmax:       1.02 m/s AV Area (Vmean):   1.54 cm     PV Vmean:      62.500 cm/s AV Area (VTI):     1.35 cm     PV VTI:        0.189 m AV Vmax:           186.00 cm/s  PV Peak grad:  4.2 mmHg AV Vmean:          138.000 cm/s PV Mean grad:  2.0 mmHg AV VTI:            0.455 m AV Peak Grad:      13.8 mmHg AV Mean Grad:      8.0 mmHg LVOT Vmax:         96.90 cm/s LVOT Vmean:        67.500 cm/s LVOT VTI:  0.196 m LVOT/AV VTI ratio: 0.43  AORTA Ao Root diam: 2.60 cm MITRAL VALVE                TRICUSPID VALVE MV Area (PHT): 1.65 cm     TR Peak grad:   33.2 mmHg MV Peak grad:  11.8 mmHg    TR Vmax:        288.00 cm/s MV Mean grad:  3.0 mmHg MV Vmax:       1.72 m/s     SHUNTS MV Vmean:      78.1 cm/s    Systemic VTI:  0.20 m MV Decel Time: 460 msec     Systemic Diam: 2.00 cm MV E velocity: 161.33 cm/s Serafina Royals MD Electronically signed by Serafina Royals MD Signature Date/Time: 05/10/2020/1:47:32 PM    Final    ASSESSMENT AND PLAN:  Ellora Horne  is a 86 y.o. Caucasian female with a known history of history of atrial fibrillation, systolic and diastolic CHF, type 2 diabetes mellitus, hypertension, dyslipidemia, CVA and coronary artery disease, who presented to the emergency room with acute onset of worsening bilateral lower extremity edema and significant  difficulty  with ambulation secondarily as well as dyspnea, orthopnea and occasional paroxysmal nocturnal dyspnea with dyspnea on exertion.  She gained 4 pounds over the last couple weeks  Acute on chronic diastolic CHF exacerbation:  -continue on IV lasix & metolazone -. Monitor I/Os. Fluid restriction. - Echo on 03/09/2019 showed EF 60-65%.  - Cardio consult with Dr. Nehemiah Massed appreciated. Continue one more days of IV Lasix and po metolazone -good urine output. Patient saturations are more than 92% on room air. Overall leg edema improving -she ambulated with physical therapy  Vasovagal episode on 6/8 x1 - approx 15 sec while pt was sitting on toilet & now bradycardic.  -CPR was not required as per pt's nurse.  -Intermittently and in a fib. Heart rate in the 70s. Denies any complaints.  HTN: continue on metolazone.  -Hold diltiazem secondary to bradycardia  PAF: continue on eliquis. Hold diltiazem secondary to bradycardia  Hypokalemia: KCl repleted.   DM2: continue on SSI w/ accuchecks.  DVT prophylaxis: eliquis  Code Status: full  Family Communication: discussed pt's care w/ pt's  Family Debbie Disposition Plan: Home in 1-2 days with HH  Consults :caridology   Status is: Inpatient  Remains inpatient appropriate because:IV treatments appropriate due to intensity of illness or inability to take PO   Dispo: The patient is from: Home              Anticipated d/c is to: Home              Anticipated d/c date is: 1 day              Patient currently is not medically stable to d/c. patient overall improving. She needs one more day of IV Lasix for diuresis. Likely discharge in a.m.    TOTAL TIME TAKING CARE OF THIS PATIENT: *25* minutes.  >50% time spent on counselling and coordination of care  Note: This dictation was prepared with Dragon dictation along with smaller phrase technology. Any transcriptional errors that result from this process are unintentional.  Fritzi Mandes M.D    [facility]   CC: Primary care physician; Idelle Crouch, MDPatient ID: Barnie Alderman, female   DOB: 16-Aug-1928, 86 y.o.   MRN: [id number]

## 2020-05-11 NOTE — Evaluation (Signed)
Physical Therapy Evaluation Patient Details Name: KEYLY BALDONADO MRN: [id number] DOB: Jan 05, 1928 Today's Date: 05/11/2020   History of Present Illness  presented to ER secondary to bilat LE edema; admitted for management of acute/chronic CHF.  Hospital course additionally significant for vagal episode and subsequent transfer to step-down unit; now fully resolved (no signs of SSS, MI per cardiology), no plans for further cardiac diagnostics.  Clinical Impression  Upon evaluation, patient alert and oriented; eager for OOB mobility and progression with PT (as step towards discharge home).  Bilat UE/LE strength and ROM grossly symmetrical and WFL; no focal weakness appreciated.  Denies pain.  Minimal edema noted to bilat LEs.  Able to complete bed mobility with min assist; sit/stand, basic transfers and gait (59') with RW, cga/min assist.  Demonstrates forward flexed posture with downward gaze; reciprocal stepping pattern with decreased step height/length, very slow and guarded, but no overt buckling or LOB.  Very broad turning radius with noted delay in reaction time.  Recommend continued use of RW and +1 cga/close sup at all times.   Vitals stable and WFL on RA throughout session; no reports of dizziness, lightheadedness or other cardiac symptoms. Would benefit from skilled PT to address above deficits and promote optimal return to PLOF.; Recommend transition to HHPT upon discharge from acute hospitalization.  Do recommend initial 24/7 assist; patient/family aware and agreeable to provide upon discharge.     Follow Up Recommendations Home health PT    Equipment Recommendations       Recommendations for Other Services       Precautions / Restrictions Precautions Precautions: Fall Restrictions Weight Bearing Restrictions: No      Mobility  Bed Mobility Overal bed mobility: Needs Assistance Bed Mobility: Supine to Sit     Supine to sit: Min assist        Transfers Overall  transfer level: Needs assistance Equipment used: Rolling walker (2 wheeled) Transfers: Sit to/from Stand Sit to Stand: Min guard;Min assist         General transfer comment: cuing for hand placement to prevent pulling on RW  Ambulation/Gait Ambulation/Gait assistance: Min guard;Min assist Gait Distance (Feet): 50 Feet Assistive device: Rolling walker (2 wheeled)       General Gait Details: forward flexed posture with downward gaze; reciprocal stepping pattern with decreased step height/length, very slow and guarded, but no overt buckling or LOB.  Very broad turning radius with noted delay in reaction time.  Recommend continued use of RW and +1 cga/close sup at all times.  Stairs            Wheelchair Mobility    Modified Rankin (Stroke Patients Only)       Balance Overall balance assessment: Needs assistance Sitting-balance support: No upper extremity supported;Feet supported Sitting balance-Leahy Scale: Good     Standing balance support: Bilateral upper extremity supported Standing balance-Leahy Scale: Fair                               Pertinent Vitals/Pain Pain Assessment: No/denies pain    Home Living Family/patient expects to be discharged to:: Private residence Living Arrangements: Alone Available Help at Discharge: Family;Available PRN/intermittently Type of Home: Apartment Home Access: Ramped entrance     Home Layout: One level Home Equipment: Walker - 4 wheels;Bedside commode;Cane - single point;Grab bars - tub/shower      Prior Function Level of Independence: Independent with assistive device(s)  Comments: Mod indep for household distances with 4WRW; assist from personal care services for ADLs/household responsibilities (3x/week, 3 hours/day).  Denies fall history within previous year     Hand Dominance   Dominant Hand: Right    Extremity/Trunk Assessment   Upper Extremity Assessment Upper Extremity Assessment:  Overall WFL for tasks assessed(grossly at least 4/5 throughout)    Lower Extremity Assessment Lower Extremity Assessment: Overall WFL for tasks assessed(grossly at least 4/5 throughout)       Communication   Communication: No difficulties  Cognition Arousal/Alertness: Awake/alert Behavior During Therapy: WFL for tasks assessed/performed Overall Cognitive Status: Within Functional Limits for tasks assessed                                        General Comments      Exercises Other Exercises Other Exercises: Toilet transfer, SPT with RW, cga/close sup; sit/stand from [facility] with RW, cga/close sup; standing balance for peri-care, hand hygiene, cga/close sup, does require UE support to maintain position/balance   Assessment/Plan    PT Assessment Patient needs continued PT services  PT Problem List Decreased strength;Decreased activity tolerance;Decreased balance;Decreased mobility       PT Treatment Interventions DME instruction;Gait training;Therapeutic activities;Functional mobility training;Therapeutic exercise;Balance training;Cognitive remediation;Patient/family education    PT Goals (Current goals can be found in the Care Plan section)  Acute Rehab PT Goals Patient Stated Goal: to return home with family support, to stay away from a rehab place PT Goal Formulation: With patient/family Time For Goal Achievement: 05/25/20 Potential to Achieve Goals: Good    Frequency Min 2X/week   Barriers to discharge Decreased caregiver support      Co-evaluation               AM-PAC PT "6 Clicks" Mobility  Outcome Measure Help needed turning from your back to your side while in a flat bed without using bedrails?: None Help needed moving from lying on your back to sitting on the side of a flat bed without using bedrails?: A Little Help needed moving to and from a bed to a chair (including a wheelchair)?: A Little Help needed standing up from a chair using your  arms (e.g., wheelchair or bedside chair)?: A Little Help needed to walk in hospital room?: A Little Help needed climbing 3-5 steps with a railing? : A Little 6 Click Score: 19    End of Session Equipment Utilized During Treatment: Gait belt Activity Tolerance: Patient tolerated treatment well Patient left: in chair;with call bell/phone within reach Nurse Communication: Mobility status PT Visit Diagnosis: Muscle weakness (generalized) (M62.81);Difficulty in walking, not elsewhere classified (R26.2)    Time: 1415-1450 PT Time Calculation (min) (ACUTE ONLY): 35 min   Charges:   PT Evaluation $PT Eval Moderate Complexity: 1 Mod PT Treatments $Therapeutic Activity: 8-22 mins       Naaman Curro H. Owens Shark, PT, DPT, NCS 05/11/20, 3:08 PM [phone]

## 2020-05-12 LAB — BASIC METABOLIC PANEL
Anion gap: 10 (ref 5–15)
BUN: 28 mg/dL — ABNORMAL HIGH (ref 8–23)
CO2: 29 mmol/L (ref 22–32)
Calcium: 9.5 mg/dL (ref 8.9–10.3)
Chloride: 94 mmol/L — ABNORMAL LOW (ref 98–111)
Creatinine, Ser: 1.16 mg/dL — ABNORMAL HIGH (ref 0.44–1.00)
GFR calc Af Amer: 48 mL/min — ABNORMAL LOW (ref >60)
GFR calc non Af Amer: 41 mL/min — ABNORMAL LOW (ref >60)
Glucose, Bld: 140 mg/dL — ABNORMAL HIGH (ref 70–99)
Potassium: 3.6 mmol/L (ref 3.5–5.1)
Sodium: 133 mmol/L — ABNORMAL LOW (ref 135–145)

## 2020-05-12 LAB — GLUCOSE, CAPILLARY: Glucose-Capillary: 124 mg/dL — ABNORMAL HIGH (ref 70–99)

## 2020-05-12 MED ORDER — PANTOPRAZOLE SODIUM 40 MG PO TBEC: 40.0000 mg | DELAYED_RELEASE_TABLET | ORAL | 0 refills | Status: AC | PRN

## 2020-05-12 MED ORDER — DILTIAZEM HCL ER COATED BEADS 120 MG PO CP24
240.0000 mg | ORAL_CAPSULE | Freq: Every day | ORAL | Status: DC
  Administered 2020-05-12: 240 mg via ORAL
  Filled 2020-05-12: qty 2

## 2020-05-12 MED ORDER — FUROSEMIDE 40 MG PO TABS: 80.0000 mg | ORAL_TABLET | Freq: Every day | ORAL | Status: DC

## 2020-05-12 NOTE — Care Management Important Message (Signed)
Important Message  Patient Details  Name: Rachael Horne MRN: [id number] Date of Birth: 07-27-1928   Medicare Important Message Given:  Yes  Initial Medicare IM given by Patient Access Associate on 05/11/2020 at 10:59am.     Dannette Barbara 05/12/2020, 12:27 PM

## 2020-05-12 NOTE — Progress Notes (Signed)
Rachael Horne to be D/C'd Home per MD order.  Discussed prescriptions and follow up appointments with the patient.Prescriptions were e-prescribed, medication list explained in detail. Pt verbalized understanding.  Allergies as of 05/12/2020      Reactions   Amoxicillin Rash      Medication List    TAKE these medications   acetaminophen 500 MG tablet Commonly known as: TYLENOL Take 1,000 mg by mouth 2 (two) times daily as needed for mild pain.   albuterol 108 (90 Base) MCG/ACT inhaler Commonly known as: VENTOLIN HFA Inhale 2 puffs into the lungs every 6 (six) hours as needed for wheezing or shortness of breath.   ALPRAZolam 0.25 MG tablet Commonly known as: XANAX Take 0.25 mg by mouth at bedtime as needed for anxiety or sleep.   Antacid Calcium 500 MG chewable tablet Generic drug: calcium carbonate Chew 1 tablet by mouth 2 (two) times daily.   budesonide-formoterol 160-4.5 MCG/ACT inhaler Commonly known as: SYMBICORT Inhale 2 puffs into the lungs 2 (two) times daily.   diltiazem 240 MG 24 hr capsule Commonly known as: DILACOR XR Take 240 mg by mouth daily.   Eliquis 2.5 MG Tabs tablet Generic drug: apixaban Take 2.5 mg by mouth 2 (two) times daily.   fluticasone 50 MCG/ACT nasal spray Commonly known as: FLONASE Place 2 sprays into both nostrils daily as needed for allergies or rhinitis.   furosemide 40 MG tablet Commonly known as: LASIX Take 80 mg by mouth daily.   gabapentin 100 MG capsule Commonly known as: NEURONTIN Take 100-300 mg by mouth See admin instructions. Take 1 to 2 capsules (100mg -200mg ) by mouth during the daytime and take 2 to 3 capsules (200mg -300mg ) by mouth as bedtime   levocetirizine 5 MG tablet Commonly known as: XYZAL Take 5 mg by mouth daily.   losartan 50 MG tablet Commonly known as: COZAAR Take 50 mg by mouth daily.   magnesium oxide 400 MG tablet Commonly known as: MAG-OX Take 400 mg by mouth daily at 12 noon.   metolazone 2.5  MG tablet Commonly known as: ZAROXOLYN Take 2.5 mg by mouth every Monday, Wednesday, and Friday.   montelukast 10 MG tablet Commonly known as: SINGULAIR Take 10 mg by mouth at bedtime.   multivitamin with minerals Tabs tablet Take 1 tablet by mouth daily at 12 noon.   pantoprazole 40 MG tablet Commonly known as: PROTONIX Take 1 tablet (40 mg total) by mouth as needed.   potassium chloride 10 MEQ tablet Commonly known as: KLOR-CON Take 20 mEq by mouth at bedtime.   senna-docusate 8.6-50 MG tablet Commonly known as: Senokot-S Take 1 tablet by mouth 2 (two) times daily as needed for mild constipation or moderate constipation.   sodium chloride 0.65 % Soln nasal spray Commonly known as: OCEAN Place 1 spray into both nostrils as needed for congestion.       Vitals:   05/12/20 0731 05/12/20 1137  BP: 140/62 (!) 149/58  Pulse: 86 96  Resp: 16 18  Temp: 98.7 F (37.1 C) 98.4 F (36.9 C)  SpO2: 96% 98%    Tele box removed and returned. Skin clean, dry and intact without evidence of skin break down, no evidence of skin tears noted. IV catheter discontinued intact. Site without signs and symptoms of complications. Dressing and pressure applied. Pt denies pain at this time. No complaints noted.  An After Visit Summary was printed and given to the patient. Patient escorted via [place], and D/C home via private auto.  Rolley Sims

## 2020-05-12 NOTE — Discharge Summary (Addendum)
[name]    MR#:  [id number]  DATE OF BIRTH:  1928-10-24  DATE OF ADMISSION:  05/09/2020 ADMITTING PHYSICIAN: Christel Mormon, MD  DATE OF DISCHARGE: 05/12/2020`  PRIMARY CARE PHYSICIAN: Idelle Crouch, MD    ADMISSION DIAGNOSIS:  Elevated brain natriuretic peptide (BNP) level [R79.89] Acute on chronic combined systolic and diastolic CHF (congestive heart failure) (HCC) [I50.43] HFrEF (heart failure with reduced ejection fraction) ([place]) [I50.20]  DISCHARGE DIAGNOSIS:  Acute on chronic Diastolic CHF Vasovagal syncope Paroxysmal Atrial Fibrillation on oral anticoagulation HTN  SECONDARY DIAGNOSIS:   Past Medical History:  Diagnosis Date  . Anxiety   . Arthritis   . Atrial fibrillation ([place])   . Breast cancer [facility]) 2013   right breast, radiation  . Breast cancer ([place]) 2007   left breast, radiation  . Cancer ([place])    BL breast  . CHF (congestive heart failure) ([place])   . Coronary artery disease   . Diabetes mellitus without complication ([place])   . DVT (deep venous thrombosis) ([place]) 2003  . Dyspnea    with exertion  . Dysrhythmia   . GERD (gastroesophageal reflux disease)   . Hyperlipemia   . Hypertension   . Personal history of radiation therapy 2013   RIGHT lumpectomy  . Stroke [facility]) 2007   TIA  . Traumatic hematoma of left lower leg 07/2017    HOSPITAL COURSE:  [name], systolic and diastolic CHF, type 2 diabetes mellitus, hypertension, dyslipidemia, CVA and coronary artery disease, who presented to the emergency room with acute onset of worsening bilateral lower extremity edema and significant difficulty with ambulation secondarily as well as dyspnea, orthopnea and occasional paroxysmal nocturnal dyspnea with dyspnea on exertion. She gained 4 pounds over the last couple weeks  Acute on chronic  diastolic CHFexacerbation:  -continue on IV lasix & metolazone--change to po home dose diuretics -. Monitor I/Os. Fluid restriction.  - Echo on 03/09/2019 showed EF 60-65%.  - Cardio consult with Dr. Nehemiah Massed appreciated. -good urine output. Patient saturations are more than 92% on room air. Overall leg edema improving -she ambulated with physical therapy  Vasovagal episode on 6/8 x1 - approx 15 sec while pt was sitting on toilet & now bradycardic.  -CPR was not required as per pt's nurse.  -Intermittently and in a fib. Heart rate in the 70s. Denies any complaints.  HTN: continue on metolazone.  -Holddiltiazem secondary to bradycardia  PAF: continue oneliquis. resumed diltiazem --Ok with cardiology  Hypokalemia: KCl repleted.   DM2: continue on SSI w/ accuchecks.  DVT prophylaxis:eliquis Code Status:full Family Communication:discussed pt's care w/ pt's  Family Rachael Horne today. Left msg for son Disposition Plan:Home today with HH  Consults :cardiology   Status is: Inpatient  Dispo: The patient is from: Home  Anticipated d/c is to: Home  Anticipated d/c date istoday  Patient currently is medically stable to d/c. patient overall improving.   CONSULTS OBTAINED:  Treatment Team:  Rachael Skains, MD  DRUG ALLERGIES:   Allergies  Allergen Reactions  . Amoxicillin Rash    DISCHARGE MEDICATIONS:   Allergies as of 05/12/2020      Reactions   Amoxicillin Rash      Medication List    TAKE these medications   acetaminophen 500 MG tablet Commonly known as: TYLENOL Take 1,000 mg by mouth 2 (two) times daily as needed for mild  pain.   albuterol 108 (90 Base) MCG/ACT inhaler Commonly known as: VENTOLIN HFA Inhale 2 puffs into the lungs every 6 (six) hours as needed for wheezing or shortness of breath.   ALPRAZolam 0.25 MG tablet Commonly known as: XANAX Take 0.25 mg by mouth at bedtime as needed for anxiety or  sleep.   Antacid Calcium 500 MG chewable tablet Generic drug: calcium carbonate Chew 1 tablet by mouth 2 (two) times daily.   budesonide-formoterol 160-4.5 MCG/ACT inhaler Commonly known as: SYMBICORT Inhale 2 puffs into the lungs 2 (two) times daily.   diltiazem 240 MG 24 hr capsule Commonly known as: DILACOR XR Take 240 mg by mouth daily.   Eliquis 2.5 MG Tabs tablet Generic drug: apixaban Take 2.5 mg by mouth 2 (two) times daily.   fluticasone 50 MCG/ACT nasal spray Commonly known as: FLONASE Place 2 sprays into both nostrils daily as needed for allergies or rhinitis.   furosemide 40 MG tablet Commonly known as: LASIX Take 80 mg by mouth daily.   gabapentin 100 MG capsule Commonly known as: NEURONTIN Take 100-300 mg by mouth See admin instructions. Take 1 to 2 capsules (100mg -200mg ) by mouth during the daytime and take 2 to 3 capsules (200mg -300mg ) by mouth as bedtime   levocetirizine 5 MG tablet Commonly known as: XYZAL Take 5 mg by mouth daily.   losartan 50 MG tablet Commonly known as: COZAAR Take 50 mg by mouth daily.   magnesium oxide 400 MG tablet Commonly known as: MAG-OX Take 400 mg by mouth daily at 12 noon.   metolazone 2.5 MG tablet Commonly known as: ZAROXOLYN Take 2.5 mg by mouth every Monday, Wednesday, and Friday.   montelukast 10 MG tablet Commonly known as: SINGULAIR Take 10 mg by mouth at bedtime.   multivitamin with minerals Tabs tablet Take 1 tablet by mouth daily at 12 noon.   pantoprazole 40 MG tablet Commonly known as: PROTONIX Take 1 tablet (40 mg total) by mouth as needed.   potassium chloride 10 MEQ tablet Commonly known as: KLOR-CON Take 20 mEq by mouth at bedtime.   senna-docusate 8.6-50 MG tablet Commonly known as: Senokot-S Take 1 tablet by mouth 2 (two) times daily as needed for mild constipation or moderate constipation.   sodium chloride 0.65 % Soln nasal spray Commonly known as: OCEAN Place 1 spray into both  nostrils as needed for congestion.       If you experience worsening of your admission symptoms, develop shortness of breath, life threatening emergency, suicidal or homicidal thoughts you must seek medical attention immediately by calling 911 or calling your MD immediately  if symptoms less severe.  You Must read complete instructions/literature along with all the possible adverse reactions/side effects for all the Medicines you take and that have been prescribed to you. Take any new Medicines after you have completely understood and accept all the possible adverse reactions/side effects.   Please note  You were cared for by a hospitalist during your hospital stay. If you have any questions about your discharge medications or the care you received while you were in the hospital after you are discharged, you can call the unit and asked to speak with the hospitalist on call if the hospitalist that took care of you is not available. Once you are discharged, your primary care physician will handle any further medical issues. Please note that NO REFILLS for any discharge medications will be authorized once you are discharged, as it is imperative that you return to  your primary care physician (or establish a relationship with a primary care physician if you do not have one) for your aftercare needs so that they can reassess your need for medications and monitor your lab values. Today   SUBJECTIVE  No new complaints  VITAL SIGNS:  Blood pressure 140/62, pulse 86, temperature 98.7 F (37.1 C), temperature source Oral, resp. rate 16, height 4\' 11"  (1.499 m), weight 57.7 kg, SpO2 96 %.  I/O:    Intake/Output Summary (Last 24 hours) at 05/12/2020 0954 Last data filed at 05/12/2020 0313 Gross per 24 hour  Intake --  Output 1000 ml  Net -1000 ml    PHYSICAL EXAMINATION:  GENERAL:  86 y.o.-year-old patient lying in the bed with no acute distress.  EYES: Pupils equal, round, reactive to light and  accommodation. No scleral icterus.  HEENT: Head atraumatic, normocephalic. Oropharynx and nasopharynx clear.  NECK:  Supple, no jugular venous distention. No thyroid enlargement, no tenderness.  LUNGS: Normal breath sounds bilaterally, no wheezing, rales,rhonchi or crepitation. No use of accessory muscles of respiration.  CARDIOVASCULAR: S1, S2 normal. No murmurs, rubs, or gallops.  ABDOMEN: Soft, non-tender, non-distended. Bowel sounds present. No organomegaly or mass.  EXTREMITIES: No pedal edema, cyanosis, or clubbing.  NEUROLOGIC: Cranial nerves II through XII are intact. Muscle strength 5/5 in all extremities. Sensation intact. Gait not checked.  PSYCHIATRIC: The patient is alert and oriented x 3.  SKIN: No obvious rash, lesion, or ulcer.   DATA REVIEW:   CBC  Recent Labs  Lab 05/11/20 0338  WBC 9.4  HGB 13.4  HCT 37.8  PLT 241    Chemistries  Recent Labs  Lab 05/09/20 1821 05/10/20 0514 05/11/20 0338 05/11/20 0338 05/12/20 0433  NA  --    < > 135   < > 133*  K  --    < > 3.9   < > 3.6  CL  --    < > 97*   < > 94*  CO2  --    < > 29   < > 29  GLUCOSE  --    < > 131*   < > 140*  BUN  --    < > 27*   < > 28*  CREATININE  --    < > 1.05*   < > 1.16*  CALCIUM  --    < > 9.6   < > 9.5  MG  --   --  2.0  --   --   AST 22  --   --   --   --   ALT 19  --   --   --   --   ALKPHOS 52  --   --   --   --   BILITOT 0.7  --   --   --   --    < > = values in this interval not displayed.    Microbiology Results   Recent Results (from the past 240 hour(s))  SARS Coronavirus 2 by RT PCR (hospital order, performed in [facility] hospital lab) Nasopharyngeal Nasopharyngeal Swab     Status: None   Collection Time: 05/09/20  8:36 PM   Specimen: Nasopharyngeal Swab  Result Value Ref Range Status   SARS Coronavirus 2 NEGATIVE NEGATIVE Final    Comment: (NOTE) SARS-CoV-2 target nucleic acids are NOT DETECTED. The SARS-CoV-2 RNA is generally detectable in upper and  lower respiratory specimens during the acute phase of infection. The lowest  concentration of SARS-CoV-2 viral copies this assay can detect is 250 copies / mL. A negative result does not preclude SARS-CoV-2 infection and should not be used as the sole basis for treatment or other patient management decisions.  A negative result may occur with improper specimen collection / handling, submission of specimen other than nasopharyngeal swab, presence of viral mutation(s) within the areas targeted by this assay, and inadequate number of viral copies (<250 copies / mL). A negative result must be combined with clinical observations, patient history, and epidemiological information. Fact Sheet for Patients:   StrictlyIdeas.no Fact Sheet for Healthcare Providers: BankingDealers.co.za This test is not yet approved or cleared  by the [place] FDA and has been authorized for detection and/or diagnosis of SARS-CoV-2 by FDA under an Emergency Use Authorization (EUA).  This EUA will remain in effect (meaning this test can be used) for the duration of the COVID-19 declaration under Section 564(b)(1) of the Act, 21 U.S.C. section 360bbb-3(b)(1), unless the authorization is terminated or revoked sooner. Performed at [facility], [address] [place], [place] [address]   MRSA PCR Screening     Status: Abnormal   Collection Time: 05/10/20 11:18 AM   Specimen: Nasopharyngeal  Result Value Ref Range Status   MRSA by PCR POSITIVE (A) NEGATIVE Final    Comment:        The GeneXpert MRSA Assay (FDA approved for NASAL specimens only), is one component of a comprehensive MRSA colonization surveillance program. It is not intended to diagnose MRSA infection nor to guide or monitor treatment for MRSA infections. RESULT CALLED TO, READ BACK BY AND VERIFIED WITH: JACK MUSTAFA 05/10/20 1426 KLW Performed at [facility], [address] [place], [place] [address]     RADIOLOGY:  ECHOCARDIOGRAM COMPLETE  Result Date: 05/10/2020    ECHOCARDIOGRAM REPORT   Patient Name:   Rachael Horne Date of Exam: 05/10/2020 Medical Rec #:  [id number]        Height:       59.0 in Accession #:    [id number]       Weight:       132.0 lb Date of Birth:  1928/05/23        BSA:          1.546 m Patient Age:    78 years         BP:           153/59 mmHg Patient Gender: F                HR:           51 bpm. Exam Location:  [facility] Procedure: 2D Echo, Color Doppler, Cardiac Doppler and Intracardiac            Opacification Agent Indications:     I50.31 CHF-Acute Diastolic  History:         Patient has prior history of Echocardiogram examinations. Risk                  Factors:Hypertension, Dyslipidemia and Diabetes. Hx of DVT.  Sonographer:     Charmayne Sheer RDCS (AE) Referring Phys:  [id number] [place] Diagnosing Phys: Serafina Royals MD  Sonographer Comments: Technically difficult study due to poor echo windows. Image acquisition challenging due to patient body habitus. IMPRESSIONS  1. Left ventricular ejection fraction, by estimation, is 55 to 60%. The left ventricle has normal function. The left ventricle has no regional wall motion abnormalities. Left  ventricular diastolic parameters were normal.  2. Right ventricular systolic function is normal. The right ventricular size is normal. There is mildly elevated pulmonary artery systolic pressure.  3. Left atrial size was mildly dilated.  4. The mitral valve is normal in structure. Mild to moderate mitral valve regurgitation.  5. The aortic valve is normal in structure. Aortic valve regurgitation is trivial. Mild aortic valve stenosis. FINDINGS  Left Ventricle: Left ventricular ejection fraction, by estimation, is 55 to 60%. The left ventricle has normal function. The left ventricle has no regional wall motion abnormalities. Definity contrast agent was given IV to delineate the left ventricular  endocardial borders. The  left ventricular internal cavity size was normal in size. There is no left ventricular hypertrophy. Left ventricular diastolic parameters were normal. Right Ventricle: The right ventricular size is normal. No increase in right ventricular wall thickness. Right ventricular systolic function is normal. There is mildly elevated pulmonary artery systolic pressure. The tricuspid regurgitant velocity is 2.88  m/s, and with an assumed right atrial pressure of 10 mmHg, the estimated right ventricular systolic pressure is 85.8 mmHg. Left Atrium: Left atrial size was mildly dilated. Right Atrium: Right atrial size was normal in size. Pericardium: There is no evidence of pericardial effusion. Mitral Valve: The mitral valve is normal in structure. Mild to moderate mitral valve regurgitation. MV peak gradient, 11.8 mmHg. The mean mitral valve gradient is 3.0 mmHg. Tricuspid Valve: The tricuspid valve is normal in structure. Tricuspid valve regurgitation is mild. Aortic Valve: The aortic valve is normal in structure. Aortic valve regurgitation is trivial. Mild aortic stenosis is present. Aortic valve mean gradient measures 8.0 mmHg. Aortic valve peak gradient measures 13.8 mmHg. Aortic valve area, by VTI measures  1.35 cm. Pulmonic Valve: The pulmonic valve was normal in structure. Pulmonic valve regurgitation is not visualized. Aorta: The aortic root and ascending aorta are structurally normal, with no evidence of dilitation. IAS/Shunts: No atrial level shunt detected by color flow Doppler.  LEFT VENTRICLE PLAX 2D LVIDd:         4.51 cm  Diastology LVIDs:         3.04 cm  LV e' lateral:   4.13 cm/s LV PW:         1.00 cm  LV E/e' lateral: 39.1 LV IVS:        0.75 cm  LV e' medial:    6.64 cm/s LVOT diam:     2.00 cm  LV E/e' medial:  24.3 LV SV:         62 LV SV Index:   40 LVOT Area:     3.14 cm  RIGHT VENTRICLE RV Basal diam:  3.23 cm LEFT ATRIUM           Index       RIGHT ATRIUM           Index LA diam:      4.30 cm 2.78  cm/m  RA Area:     15.00 cm LA Vol (A4C): 80.8 ml 52.27 ml/m RA Volume:   33.50 ml  21.67 ml/m  AORTIC VALVE                    PULMONIC VALVE AV Area (Vmax):    1.64 cm     PV Vmax:       1.02 m/s AV Area (Vmean):   1.54 cm     PV Vmean:      62.500 cm/s AV Area (VTI):  1.35 cm     PV VTI:        0.189 m AV Vmax:           186.00 cm/s  PV Peak grad:  4.2 mmHg AV Vmean:          138.000 cm/s PV Mean grad:  2.0 mmHg AV VTI:            0.455 m AV Peak Grad:      13.8 mmHg AV Mean Grad:      8.0 mmHg LVOT Vmax:         96.90 cm/s LVOT Vmean:        67.500 cm/s LVOT VTI:          0.196 m LVOT/AV VTI ratio: 0.43  AORTA Ao Root diam: 2.60 cm MITRAL VALVE                TRICUSPID VALVE MV Area (PHT): 1.65 cm     TR Peak grad:   33.2 mmHg MV Peak grad:  11.8 mmHg    TR Vmax:        288.00 cm/s MV Mean grad:  3.0 mmHg MV Vmax:       1.72 m/s     SHUNTS MV Vmean:      78.1 cm/s    Systemic VTI:  0.20 m MV Decel Time: 460 msec     Systemic Diam: 2.00 cm MV E velocity: 161.33 cm/s Serafina Royals MD Electronically signed by Serafina Royals MD Signature Date/Time: 05/10/2020/1:47:32 PM    Final      CODE STATUS:     Code Status Orders  (From admission, onward)         Start     Ordered   05/09/20 1940  Full code  Continuous        05/09/20 1943        Code Status History    Date Active Date Inactive Code Status Order ID Comments User Context   03/09/2019 0257 03/11/2019 1618 Full Code [id number]  Lance Coon, MD Inpatient   08/02/2017 0709 08/05/2017 1928 Full Code [id number]  Leim Fabry, MD Inpatient   11/03/2015 1532 11/08/2015 1011 Full Code [id number]  Theodoro Grist, MD Inpatient   Advance Care Planning Activity    Advance Directive Documentation     Most Recent Value  Type of Advance Directive Healthcare Power of Attorney, Living will  Pre-existing out of facility DNR order (yellow form or pink MOST form) --  "MOST" Form in Place? --       TOTAL TIME TAKING CARE OF THIS PATIENT: 40 minutes.     Fritzi Mandes M.D  [facility]    CC: Primary care physician; Idelle Crouch, MD

## 2020-05-17 ENCOUNTER — Telehealth: Payer: Self-pay | Admitting: Family

## 2020-05-17 NOTE — Progress Notes (Signed)
Patient ID: Rachael Horne, female    DOB: Jun 14, 1928, 86 y.o.   MRN: [id number]  HPI  Rachael Horne is a 86 y/o female with a history of breast cancer, CAD, DM, hyperlipidemia, HTN, stroke, DVT, anxiety, GERD, atrial fibrillation and chronic heart failure.   Echo report from 05/10/20 reviewed and showed an EF of 55-60% along with mildly elevated PA pressure, mild LAE and mild/moderate MR.   Admitted 05/09/20 due to acute on chronic HF. Cardiology consult obtained. Initially given IV lasix and then transitioned to oral diuretics. Diltiazem held due to bradycardia. Discharged after 3 days.   She presents today for her initial visit with a chief complaint of moderate fatigue upon minimal exertion. She describes this as chronic in nature having been present for several years. She has associated shortness of breath, pedal edema and abdominal distention along with this. She denies any difficulty sleeping, dizziness, palpitations, chest pain, cough or weight gain.   Past Medical History:  Diagnosis Date  . Anxiety   . Arthritis   . Atrial fibrillation ([place])   . Breast cancer [facility]) 2013   right breast, radiation  . Breast cancer ([place]) 2007   left breast, radiation  . Cancer ([place])    BL breast  . CHF (congestive heart failure) ([place])   . Coronary artery disease   . Diabetes mellitus without complication ([place])   . DVT (deep venous thrombosis) ([place]) 2003  . Dyspnea    with exertion  . Dysrhythmia   . GERD (gastroesophageal reflux disease)   . Hyperlipemia   . Hypertension   . Personal history of radiation therapy 2013   RIGHT lumpectomy  . Stroke [facility]) 2007   TIA  . Traumatic hematoma of left lower leg 07/2017   Past Surgical History:  Procedure Laterality Date  . APPENDECTOMY    . APPLICATION OF WOUND VAC Left 08/02/2017   Procedure: APPLICATION OF WOUND VAC;  Surgeon: Leim Fabry, MD;  Location: [facility] ORS;  Service: Orthopedics;  Laterality: Left;  . BILATERAL CARPAL TUNNEL RELEASE  Bilateral   . BREAST LUMPECTOMY Right 2013   RIGHT lumpectomy w/ radiation 2013  . BREAST LUMPECTOMY Left 2009   LEFT LUMPECTOMY W/ RADIATION 2009  . CARDIAC CATHETERIZATION    . CHOLECYSTECTOMY    . EYE SURGERY Bilateral    Cataract Extraction with IOL  . HERNIA REPAIR     Umbilical Hernia Repair  . I & D EXTREMITY Left 08/02/2017   Procedure: I & D LEFT LEG HEMATOMA;  Surgeon: Leim Fabry, MD;  Location: [facility] ORS;  Service: Orthopedics;  Laterality: Left;  . TONSILLECTOMY     Family History  Problem Relation Age of Onset  . Breast cancer Neg Hx    Social History   Tobacco Use  . Smoking status: Never Smoker  . Smokeless tobacco: Never Used  Substance Use Topics  . Alcohol use: No   Allergies  Allergen Reactions  . Bactrim [Sulfamethoxazole-Trimethoprim] Other (See Comments)    weakness  . Levofloxacin Other (See Comments)    weakness  . Amoxicillin Rash   Prior to Admission medications   Medication Sig Start Date End Date Taking? Authorizing Provider  acetaminophen (TYLENOL) 500 MG tablet Take 1,000 mg by mouth 2 (two) times daily as needed for mild pain.   Yes [provider]  albuterol (PROVENTIL HFA;VENTOLIN HFA) 108 (90 Base) MCG/ACT inhaler Inhale 2 puffs into the lungs every 6 (six) hours as needed for wheezing or shortness of breath.  Yes [provider]  ALPRAZolam (XANAX) 0.25 MG tablet Take 0.25 mg by mouth at bedtime as needed for anxiety or sleep.   Yes [provider]  apixaban (ELIQUIS) 2.5 MG TABS tablet Take 2.5 mg by mouth 2 (two) times daily.    Yes [provider]  budesonide-formoterol (SYMBICORT) 160-4.5 MCG/ACT inhaler Inhale 2 puffs into the lungs 2 (two) times daily.    Yes [provider]  calcium carbonate (ANTACID CALCIUM) 500 MG chewable tablet Chew 1 tablet by mouth 2 (two) times daily.   Yes [provider]  diltiazem (DILACOR XR) 240 MG 24 hr capsule Take 240 mg by mouth daily.   Yes  [provider]  fluticasone (FLONASE) 50 MCG/ACT nasal spray Place 2 sprays into both nostrils daily as needed for allergies or rhinitis.    Yes [provider]  furosemide (LASIX) 40 MG tablet Take 80 mg by mouth daily.    Yes [provider]  gabapentin (NEURONTIN) 100 MG capsule Take 100-300 mg by mouth See admin instructions. Take 1 to 2 capsules (100mg -200mg ) by mouth during the daytime and take 2 to 3 capsules (200mg -300mg ) by mouth as bedtime   Yes [provider]  levocetirizine (XYZAL) 5 MG tablet Take 5 mg by mouth daily. 04/30/20  Yes [provider]  losartan (COZAAR) 50 MG tablet Take 50 mg by mouth daily. 03/21/20  Yes [provider]  magnesium oxide (MAG-OX) 400 MG tablet Take 400 mg by mouth daily at 12 noon.   Yes [provider]  metolazone (ZAROXOLYN) 2.5 MG tablet Take 2.5 mg by mouth every Monday, Wednesday, and Friday.    Yes [provider]  montelukast (SINGULAIR) 10 MG tablet Take 10 mg by mouth at bedtime.   Yes [provider]  Multiple Vitamin (MULTIVITAMIN WITH MINERALS) TABS tablet Take 1 tablet by mouth daily at 12 noon.   Yes [provider]  pantoprazole (PROTONIX) 40 MG tablet Take 1 tablet (40 mg total) by mouth as needed. 05/12/20  Yes Fritzi Mandes, MD  potassium chloride (KLOR-CON) 10 MEQ tablet Take 20 mEq by mouth at bedtime. 03/30/20  Yes [provider]  senna-docusate (SENOKOT-S) 8.6-50 MG tablet Take 1 tablet by mouth 2 (two) times daily as needed for mild constipation or moderate constipation. 11/07/15  Yes Aldean Jewett, MD  sodium chloride (OCEAN) 0.65 % SOLN nasal spray Place 1 spray into both nostrils as needed for congestion. 11/07/15  Yes Aldean Jewett, MD    Review of Systems  Constitutional: Positive for fatigue (easily). Negative for appetite change.  HENT: Negative for congestion, postnasal drip and sore throat.   Eyes: Negative.    Respiratory: Positive for shortness of breath (minimal). Negative for cough.   Cardiovascular: Positive for leg swelling. Negative for chest pain and palpitations.  Gastrointestinal: Positive for abdominal distention. Negative for abdominal pain.  Endocrine: Negative.   Genitourinary: Negative.   Musculoskeletal: Negative for back pain and neck pain.  Skin: Negative.   Allergic/Immunologic: Negative.   Neurological: Negative for dizziness and light-headedness.  Hematological: Negative for adenopathy. Does not bruise/bleed easily.  Psychiatric/Behavioral: Negative for dysphoric mood and sleep disturbance (sleeping on 3 pillows). The patient is not nervous/anxious.     Vitals:   05/18/20 1159  BP: (!) 143/61  Pulse: (!) 48  Resp: 16  SpO2: 100%  Weight: 128 lb (58.1 kg)  Height: 4\' 11"  (1.499 m)   Wt Readings from Last 3 Encounters:  05/18/20  128 lb (58.1 kg)  05/12/20 127 lb 1.6 oz (57.7 kg)  09/06/19 126 lb (57.2 kg)   Lab Results  Component Value Date   CREATININE 1.16 (H) 05/12/2020   CREATININE 1.05 (H) 05/11/2020   CREATININE 0.95 05/10/2020    Physical Exam Vitals and nursing note reviewed.  Constitutional:      Appearance: Normal appearance.  HENT:     Head: Normocephalic and atraumatic.  Eyes:     Conjunctiva/sclera:     Right eye: Right conjunctiva is injected.     Left eye: Left conjunctiva is injected.  Cardiovascular:     Rate and Rhythm: Bradycardia present. Rhythm irregular.  Pulmonary:     Effort: Pulmonary effort is normal. No respiratory distress.     Breath sounds: Wheezing (few expiratory) present. No rales.  Abdominal:     General: There is distension.     Palpations: Abdomen is soft.  Musculoskeletal:        General: No tenderness.     Cervical back: Normal range of motion and neck supple.     Right lower leg: Edema (1+ pitting) present.     Left lower leg: Edema (1+ pitting) present.  Skin:    General: Skin is warm and dry.   Neurological:     General: No focal deficit present.     Mental Status: She is alert and oriented to person, place, and time.  Psychiatric:        Mood and Affect: Mood normal.        Behavior: Behavior normal.        Thought Content: Thought content normal.     Assessment & Plan:  1: Chronic heart failure with preserved ejection fraction along with structural changes- - NYHA class III - euvolemic today - weighing daily; reminded to call for an overnight weight gain of >2 pounds or a weekly weight gain of >5 pounds - not adding "much" salt to her food; reviewed the importance of following a low sodium diet as best she can where she lives - saw cardiology Margarito Courser) 01/29/20 & returns tomorrow - will change her diuretic to torsemide 60mg  daily; advised her to stop the furosemide; will check BMP at her next visit if not done sooner elsewhere - when she takes metolazone, she's to take an additional 61meq potassium on those days - normally wears compression socks but didn't wear them to the appointment today; is planning on elevating her legs once she gets home today - BNP 05/09/20 was 282.5  2: HTN- - BP looks good today - saw PCP Venetia Maxon) 04/08/20 - BMP 05/12/20 reviewed and showed sodium 133, potassium 3.6, creatinine 1.16 and GFR 41  3: DM- - A1c 05/10/20 was 6.8%  4: Atrial fibrillation- - bradycardic today but has been tachycardic - if continues to be bradycardic, may need to decrease diltiazem to 120 or 180 mg daily   Patient did not bring her medications nor a list. Each medication was verbally reviewed with the patient and she was encouraged to bring the bottles to every visit to confirm accuracy of list.  Return in 1 month or sooner for any questions/problems before then.

## 2020-05-17 NOTE — Telephone Encounter (Signed)
Patient confirmed appointment for 6/16 as a new patient at the [facility] with [name] after her recent hospital discharge 6/10. She is doing well with no complaints at this time. She is checking her weight daily, following a Horne sodium diet and trying to stay active as much as she can and is taking medications with no issues so far.   Rachael Horne, [place]

## 2020-05-18 ENCOUNTER — Ambulatory Visit: Payer: Medicare PPO | Admitting: Family

## 2020-05-18 ENCOUNTER — Encounter: Payer: Self-pay | Admitting: Family

## 2020-05-18 ENCOUNTER — Other Ambulatory Visit: Payer: Self-pay

## 2020-05-18 ENCOUNTER — Other Ambulatory Visit
Admission: RE | Admit: 2020-05-18 | Discharge: 2020-05-18 | Disposition: A | Discharge status: Home / Self Care | Payer: Medicare PPO | Source: Ambulatory Visit | Attending: Ophthalmology | Admitting: Ophthalmology

## 2020-05-18 VITALS — BP 143/61 | HR 48 | Resp 16 | Ht 59.0 in | Wt 128.0 lb

## 2020-05-18 DIAGNOSIS — I110 Hypertensive heart disease with heart failure: Secondary | ICD-10-CM | POA: Insufficient documentation

## 2020-05-18 DIAGNOSIS — E1122 Type 2 diabetes mellitus with diabetic chronic kidney disease: Secondary | ICD-10-CM

## 2020-05-18 DIAGNOSIS — Z853 Personal history of malignant neoplasm of breast: Secondary | ICD-10-CM | POA: Insufficient documentation

## 2020-05-18 DIAGNOSIS — Z923 Personal history of irradiation: Secondary | ICD-10-CM | POA: Diagnosis not present

## 2020-05-18 DIAGNOSIS — M1990 Unspecified osteoarthritis, unspecified site: Secondary | ICD-10-CM | POA: Insufficient documentation

## 2020-05-18 DIAGNOSIS — R140 Abdominal distension (gaseous): Secondary | ICD-10-CM | POA: Insufficient documentation

## 2020-05-18 DIAGNOSIS — Z86718 Personal history of other venous thrombosis and embolism: Secondary | ICD-10-CM | POA: Insufficient documentation

## 2020-05-18 DIAGNOSIS — Z7901 Long term (current) use of anticoagulants: Secondary | ICD-10-CM | POA: Insufficient documentation

## 2020-05-18 DIAGNOSIS — I5032 Chronic diastolic (congestive) heart failure: Secondary | ICD-10-CM | POA: Insufficient documentation

## 2020-05-18 DIAGNOSIS — Z880 Allergy status to penicillin: Secondary | ICD-10-CM | POA: Insufficient documentation

## 2020-05-18 DIAGNOSIS — I509 Heart failure, unspecified: Secondary | ICD-10-CM | POA: Diagnosis present

## 2020-05-18 DIAGNOSIS — Z882 Allergy status to sulfonamides status: Secondary | ICD-10-CM | POA: Diagnosis not present

## 2020-05-18 DIAGNOSIS — Z881 Allergy status to other antibiotic agents status: Secondary | ICD-10-CM | POA: Insufficient documentation

## 2020-05-18 DIAGNOSIS — E119 Type 2 diabetes mellitus without complications: Secondary | ICD-10-CM | POA: Insufficient documentation

## 2020-05-18 DIAGNOSIS — I10 Essential (primary) hypertension: Secondary | ICD-10-CM

## 2020-05-18 DIAGNOSIS — R5383 Other fatigue: Secondary | ICD-10-CM | POA: Insufficient documentation

## 2020-05-18 DIAGNOSIS — Z8673 Personal history of transient ischemic attack (TIA), and cerebral infarction without residual deficits: Secondary | ICD-10-CM | POA: Insufficient documentation

## 2020-05-18 DIAGNOSIS — I4891 Unspecified atrial fibrillation: Secondary | ICD-10-CM | POA: Insufficient documentation

## 2020-05-18 DIAGNOSIS — I480 Paroxysmal atrial fibrillation: Secondary | ICD-10-CM

## 2020-05-18 DIAGNOSIS — R0602 Shortness of breath: Secondary | ICD-10-CM | POA: Insufficient documentation

## 2020-05-18 DIAGNOSIS — E785 Hyperlipidemia, unspecified: Secondary | ICD-10-CM | POA: Insufficient documentation

## 2020-05-18 DIAGNOSIS — R001 Bradycardia, unspecified: Secondary | ICD-10-CM | POA: Insufficient documentation

## 2020-05-18 DIAGNOSIS — Z79899 Other long term (current) drug therapy: Secondary | ICD-10-CM | POA: Insufficient documentation

## 2020-05-18 DIAGNOSIS — I2510 Atherosclerotic heart disease of native coronary artery without angina pectoris: Secondary | ICD-10-CM | POA: Diagnosis not present

## 2020-05-18 DIAGNOSIS — K219 Gastro-esophageal reflux disease without esophagitis: Secondary | ICD-10-CM | POA: Insufficient documentation

## 2020-05-18 DIAGNOSIS — Z7951 Long term (current) use of inhaled steroids: Secondary | ICD-10-CM | POA: Insufficient documentation

## 2020-05-18 DIAGNOSIS — F419 Anxiety disorder, unspecified: Secondary | ICD-10-CM | POA: Insufficient documentation

## 2020-05-18 MED ORDER — TORSEMIDE 20 MG PO TABS: 60.0000 mg | ORAL_TABLET | Freq: Every day | ORAL | 3 refills | Status: AC

## 2020-05-18 MED ORDER — METOLAZONE 2.5 MG PO TABS: 2.5000 mg | ORAL_TABLET | ORAL | 5 refills | Status: AC

## 2020-05-18 NOTE — Patient Instructions (Addendum)
Continue weighing daily and call for an overnight weight gain of > 2 pounds or a weekly weight gain of >5 pounds.  Changing your fluid pill to torsemide 60mg  daily which means you will take 3 tablets every morning. You will no longer take furosemide  On the days that you take the metolazone, take one additional potassium tablet on those days.

## 2020-05-21 LAB — AEROBIC CULTURE W GRAM STAIN (SUPERFICIAL SPECIMEN)

## 2020-06-28 NOTE — Progress Notes (Signed)
Patient ID: Rachael Horne, female    DOB: 1928-03-10, 86 y.o.   MRN: [id number]  HPI  Ms Sherk is a 86 y/o female with a history of breast cancer, CAD, DM, hyperlipidemia, HTN, stroke, DVT, anxiety, GERD, atrial fibrillation and chronic heart failure.   Echo report from 05/10/20 reviewed and showed an EF of 55-60% along with mildly elevated PA pressure, mild LAE and mild/moderate MR.   Admitted 05/09/20 due to acute on chronic HF. Cardiology consult obtained. Initially given IV lasix and then transitioned to oral diuretics. Diltiazem held due to bradycardia. Discharged after 3 days.   She presents today for a follow-up visit with a chief complaint of moderate fatigue upon minimal exertion. She describes this as chronic in nature having been present for several years. She has associated shortness of breath and abdominal distention along with this. She denies any difficulty sleeping, dizziness, palpitations, pedal edema, chest pain, cough or weight gain.   Says that she feels better and is able to get around more since her diuretic was changed from furosemide to torsemide.    Past Medical History:  Diagnosis Date  . Anxiety   . Arthritis   . Atrial fibrillation ([place])   . Breast cancer [facility]) 2013   right breast, radiation  . Breast cancer ([place]) 2007   left breast, radiation  . Cancer ([place])    BL breast  . CHF (congestive heart failure) ([place])   . Coronary artery disease   . Diabetes mellitus without complication ([place])   . DVT (deep venous thrombosis) ([place]) 2003  . Dyspnea    with exertion  . Dysrhythmia   . GERD (gastroesophageal reflux disease)   . Hyperlipemia   . Hypertension   . Personal history of radiation therapy 2013   RIGHT lumpectomy  . Stroke [facility]) 2007   TIA  . Traumatic hematoma of left lower leg 07/2017   Past Surgical History:  Procedure Laterality Date  . APPENDECTOMY    . APPLICATION OF WOUND VAC Left 08/02/2017   Procedure: APPLICATION OF WOUND VAC;   Surgeon: Leim Fabry, MD;  Location: [facility] ORS;  Service: Orthopedics;  Laterality: Left;  . BILATERAL CARPAL TUNNEL RELEASE Bilateral   . BREAST LUMPECTOMY Right 2013   RIGHT lumpectomy w/ radiation 2013  . BREAST LUMPECTOMY Left 2009   LEFT LUMPECTOMY W/ RADIATION 2009  . CARDIAC CATHETERIZATION    . CHOLECYSTECTOMY    . EYE SURGERY Bilateral    Cataract Extraction with IOL  . HERNIA REPAIR     Umbilical Hernia Repair  . I & D EXTREMITY Left 08/02/2017   Procedure: I & D LEFT LEG HEMATOMA;  Surgeon: Leim Fabry, MD;  Location: [facility] ORS;  Service: Orthopedics;  Laterality: Left;  . TONSILLECTOMY     Family History  Problem Relation Age of Onset  . Breast cancer Neg Hx    Social History   Tobacco Use  . Smoking status: Never Smoker  . Smokeless tobacco: Never Used  Substance Use Topics  . Alcohol use: No   Allergies  Allergen Reactions  . Bactrim [Sulfamethoxazole-Trimethoprim] Other (See Comments)    weakness  . Levofloxacin Other (See Comments)    weakness  . Amoxicillin Rash   Prior to Admission medications   Medication Sig Start Date End Date Taking? Authorizing Provider  acetaminophen (TYLENOL) 500 MG tablet Take 1,000 mg by mouth 2 (two) times daily as needed for mild pain.   Yes [provider]  albuterol (PROVENTIL HFA;VENTOLIN  HFA) 108 (90 Base) MCG/ACT inhaler Inhale 2 puffs into the lungs every 6 (six) hours as needed for wheezing or shortness of breath.   Yes [provider]  ALPRAZolam (XANAX) 0.25 MG tablet Take 0.25 mg by mouth at bedtime as needed for anxiety or sleep.   Yes [provider]  apixaban (ELIQUIS) 2.5 MG TABS tablet Take 2.5 mg by mouth 2 (two) times daily.    Yes [provider]  budesonide-formoterol (SYMBICORT) 160-4.5 MCG/ACT inhaler Inhale 2 puffs into the lungs 2 (two) times daily.    Yes [provider]  calcium carbonate (ANTACID CALCIUM) 500 MG chewable tablet Chew 1 tablet by mouth 2 (two)  times daily.   Yes [provider]  diltiazem (DILACOR XR) 240 MG 24 hr capsule Take 240 mg by mouth daily.   Yes [provider]  fluticasone (FLONASE) 50 MCG/ACT nasal spray Place 2 sprays into both nostrils daily as needed for allergies or rhinitis.    Yes [provider]  gabapentin (NEURONTIN) 100 MG capsule Take 100-300 mg by mouth See admin instructions. Take 1 to 2 capsules (100mg -200mg ) by mouth during the daytime and take 2 to 3 capsules (200mg -300mg ) by mouth as bedtime   Yes [provider]  levocetirizine (XYZAL) 5 MG tablet Take 5 mg by mouth daily. 04/30/20  Yes [provider]  losartan (COZAAR) 50 MG tablet Take 50 mg by mouth daily. 03/21/20  Yes [provider]  magnesium oxide (MAG-OX) 400 MG tablet Take 400 mg by mouth daily at 12 noon.   Yes [provider]  metolazone (ZAROXOLYN) 2.5 MG tablet Take 1 tablet (2.5 mg total) by mouth every Monday, Wednesday, and Friday. 05/18/20  Yes Janisha Bueso A, FNP  montelukast (SINGULAIR) 10 MG tablet Take 10 mg by mouth at bedtime.   Yes [provider]  Multiple Vitamin (MULTIVITAMIN WITH MINERALS) TABS tablet Take 1 tablet by mouth daily at 12 noon.   Yes [provider]  pantoprazole (PROTONIX) 40 MG tablet Take 1 tablet (40 mg total) by mouth as needed. 05/12/20  Yes Fritzi Mandes, MD  potassium chloride (KLOR-CON) 10 MEQ tablet Take 20 mEq by mouth at bedtime. 03/30/20  Yes [provider]  senna-docusate (SENOKOT-S) 8.6-50 MG tablet Take 1 tablet by mouth 2 (two) times daily as needed for mild constipation or moderate constipation. 11/07/15  Yes Aldean Jewett, MD  sodium chloride (OCEAN) 0.65 % SOLN nasal spray Place 1 spray into both nostrils as needed for congestion. 11/07/15  Yes Aldean Jewett, MD  torsemide (DEMADEX) 20 MG tablet Take 3 tablets (60 mg total) by mouth daily. Patient taking differently: Take 40 mg by mouth daily.  05/18/20  06/17/20  Alisa Graff, FNP     Review of Systems  Constitutional: Positive for fatigue (easily). Negative for appetite change.  HENT: Negative for congestion, postnasal drip and sore throat.   Eyes: Negative.   Respiratory: Positive for shortness of breath (minimal). Negative for cough.   Cardiovascular: Negative for chest pain, palpitations and leg swelling.  Gastrointestinal: Positive for abdominal distention. Negative for abdominal pain.  Endocrine: Negative.   Genitourinary: Negative.   Musculoskeletal: Negative for back pain and neck pain.  Skin: Negative.   Allergic/Immunologic: Negative.   Neurological: Negative for dizziness and light-headedness.  Hematological: Negative for adenopathy. Does not bruise/bleed easily.  Psychiatric/Behavioral: Negative for dysphoric mood and sleep disturbance (sleeping on 3 pillows). The patient is not nervous/anxious.  Vitals:   06/29/20 1417  BP: (!) 170/73  Pulse: 73  Resp: 16  SpO2: 96%  Weight: 120 lb (54.4 kg)  Height: 4\' 10"  (1.473 m)   Wt Readings from Last 3 Encounters:  06/29/20 120 lb (54.4 kg)  05/18/20 128 lb (58.1 kg)  05/12/20 127 lb 1.6 oz (57.7 kg)   Lab Results  Component Value Date   CREATININE 1.16 (H) 05/12/2020   CREATININE 1.05 (H) 05/11/2020   CREATININE 0.95 05/10/2020    Physical Exam Vitals and nursing note reviewed.  Constitutional:      Appearance: Normal appearance.  HENT:     Head: Normocephalic and atraumatic.  Eyes:     Conjunctiva/sclera:     Right eye: Right conjunctiva is injected.     Left eye: Left conjunctiva is injected.  Cardiovascular:     Rate and Rhythm: Normal rate. Rhythm irregular.  Pulmonary:     Effort: Pulmonary effort is normal. No respiratory distress.     Breath sounds: Wheezing (few expiratory) present. No rales.  Abdominal:     General: There is distension (softer).     Palpations: Abdomen is soft.  Musculoskeletal:        General: No tenderness.      Cervical back: Normal range of motion and neck supple.     Right lower leg: No edema.     Left lower leg: No edema.  Skin:    General: Skin is warm and dry.  Neurological:     General: No focal deficit present.     Mental Status: She is alert and oriented to person, place, and time.  Psychiatric:        Mood and Affect: Mood normal.        Behavior: Behavior normal.        Thought Content: Thought content normal.     Assessment & Plan:  1: Chronic heart failure with preserved ejection fraction along with structural changes- - NYHA class III - euvolemic today - weighing daily; reminded to call for an overnight weight gain of >2 pounds or a weekly weight gain of >5 pounds - weight down 8 pounds from last visit here 6 weeks ago - not adding "much" salt to her food; reviewed the importance of following a low sodium diet as best she can - saw cardiology ([place]) 05/19/20 - normally wears compression socks but didn't wear them today as swelling has completely resolved; now taking torsemide 40mg  daily instead of 60mg  daily - BNP 05/09/20 was 282.5 - reports receiving both her COVID vaccines  2: HTN- - BP elevated today but she says that she gets nervous when coming to appointments; has history of hypotension - saw PCP ([place]) 06/15/20 - BMP 06/15/20 reviewed and showed sodium 137, potassium 3.4, creatinine 1.2 and GFR 42  3: DM- - A1c 05/10/20 was 6.8%    Patient did not bring her medications nor a list. Each medication was verbally reviewed with the patient and she was encouraged to bring the bottles to every visit to confirm accuracy of list.  Return in 6 months or sooner for any questions/problems before then.

## 2020-06-29 ENCOUNTER — Ambulatory Visit: Payer: Medicare PPO | Attending: Family | Admitting: Family

## 2020-06-29 ENCOUNTER — Encounter: Payer: Self-pay | Admitting: Family

## 2020-06-29 ENCOUNTER — Other Ambulatory Visit: Payer: Self-pay

## 2020-06-29 VITALS — BP 170/73 | HR 73 | Resp 16 | Ht <= 58 in | Wt 120.0 lb

## 2020-06-29 DIAGNOSIS — Z7951 Long term (current) use of inhaled steroids: Secondary | ICD-10-CM | POA: Insufficient documentation

## 2020-06-29 DIAGNOSIS — Z880 Allergy status to penicillin: Secondary | ICD-10-CM | POA: Diagnosis not present

## 2020-06-29 DIAGNOSIS — Z923 Personal history of irradiation: Secondary | ICD-10-CM | POA: Insufficient documentation

## 2020-06-29 DIAGNOSIS — Z853 Personal history of malignant neoplasm of breast: Secondary | ICD-10-CM | POA: Insufficient documentation

## 2020-06-29 DIAGNOSIS — I2510 Atherosclerotic heart disease of native coronary artery without angina pectoris: Secondary | ICD-10-CM | POA: Insufficient documentation

## 2020-06-29 DIAGNOSIS — E119 Type 2 diabetes mellitus without complications: Secondary | ICD-10-CM | POA: Insufficient documentation

## 2020-06-29 DIAGNOSIS — E785 Hyperlipidemia, unspecified: Secondary | ICD-10-CM | POA: Diagnosis not present

## 2020-06-29 DIAGNOSIS — Z86718 Personal history of other venous thrombosis and embolism: Secondary | ICD-10-CM | POA: Diagnosis not present

## 2020-06-29 DIAGNOSIS — Z881 Allergy status to other antibiotic agents status: Secondary | ICD-10-CM | POA: Insufficient documentation

## 2020-06-29 DIAGNOSIS — Z7901 Long term (current) use of anticoagulants: Secondary | ICD-10-CM | POA: Diagnosis not present

## 2020-06-29 DIAGNOSIS — Z8673 Personal history of transient ischemic attack (TIA), and cerebral infarction without residual deficits: Secondary | ICD-10-CM | POA: Diagnosis not present

## 2020-06-29 DIAGNOSIS — M1990 Unspecified osteoarthritis, unspecified site: Secondary | ICD-10-CM | POA: Insufficient documentation

## 2020-06-29 DIAGNOSIS — Z9049 Acquired absence of other specified parts of digestive tract: Secondary | ICD-10-CM | POA: Insufficient documentation

## 2020-06-29 DIAGNOSIS — N1832 Chronic kidney disease, stage 3b: Secondary | ICD-10-CM

## 2020-06-29 DIAGNOSIS — F419 Anxiety disorder, unspecified: Secondary | ICD-10-CM | POA: Insufficient documentation

## 2020-06-29 DIAGNOSIS — I5032 Chronic diastolic (congestive) heart failure: Secondary | ICD-10-CM | POA: Diagnosis present

## 2020-06-29 DIAGNOSIS — K219 Gastro-esophageal reflux disease without esophagitis: Secondary | ICD-10-CM | POA: Diagnosis not present

## 2020-06-29 DIAGNOSIS — I110 Hypertensive heart disease with heart failure: Secondary | ICD-10-CM | POA: Diagnosis not present

## 2020-06-29 DIAGNOSIS — Z79899 Other long term (current) drug therapy: Secondary | ICD-10-CM | POA: Insufficient documentation

## 2020-06-29 DIAGNOSIS — I10 Essential (primary) hypertension: Secondary | ICD-10-CM

## 2020-06-29 NOTE — Patient Instructions (Signed)
Continue weighing daily and call for an overnight weight gain of > 2 pounds or a weekly weight gain of >5 pounds. 

## 2020-07-03 DEATH — deceased

## 2020-12-28 ENCOUNTER — Ambulatory Visit: Payer: Medicare PPO | Admitting: Family

## 2021-12-18 IMAGING — CT CT ORBITS W/ CM
3 series · 12 of 47 positions shown, 14 images · IV contrast (omnipaque)
Comparison: CT orbit 03/02/2019

CLINICAL DATA: Right orbital swelling and erythema 3 days. History
breast cancer.

EXAM:
CT ORBITS WITH CONTRAST
TECHNIQUE: Multidetector CT images was performed according to the standard
protocol following intravenous contrast administration.
CONTRAST:  75mL OMNIPAQUE IOHEXOL 300 MG/ML  SOLN

[Series 3: orbits 2.0 (person_name)30(person_name) (person_na · axial · 0.33mm/px · z∈[-147,-69]mm · 6 of 49 slices shown, 8 images]
[im 5/49  brain]
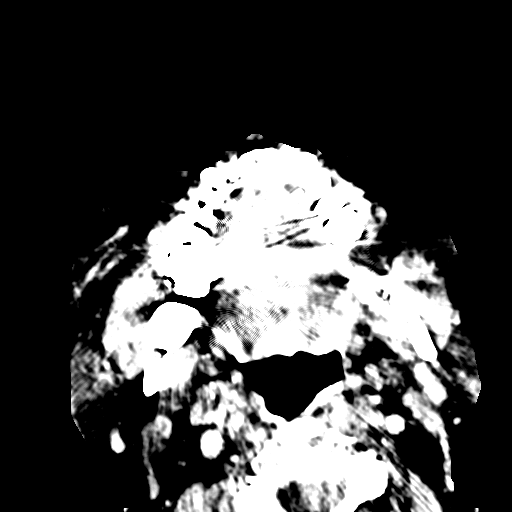
[im 5/49  bone]
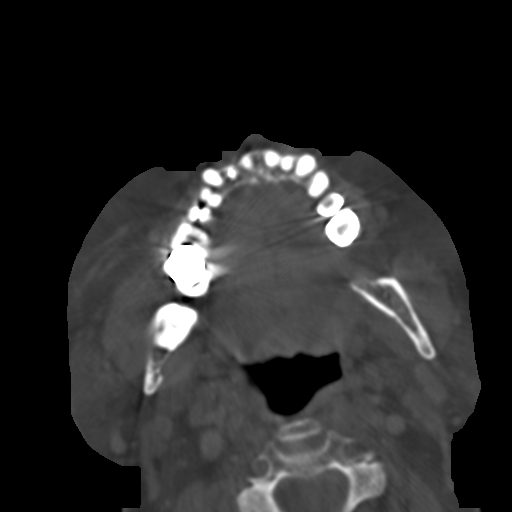
[im 14/49  bone]
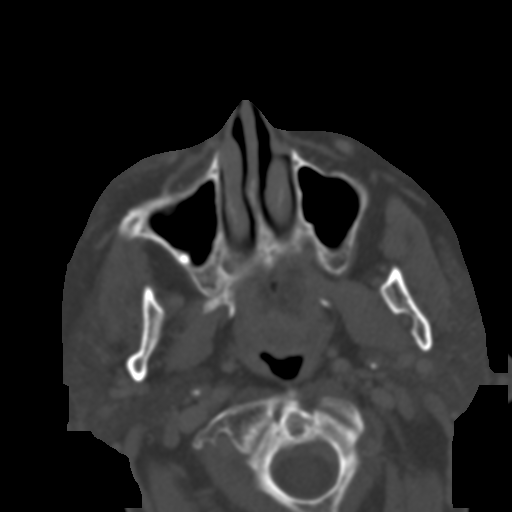
[im 20/49  bone]
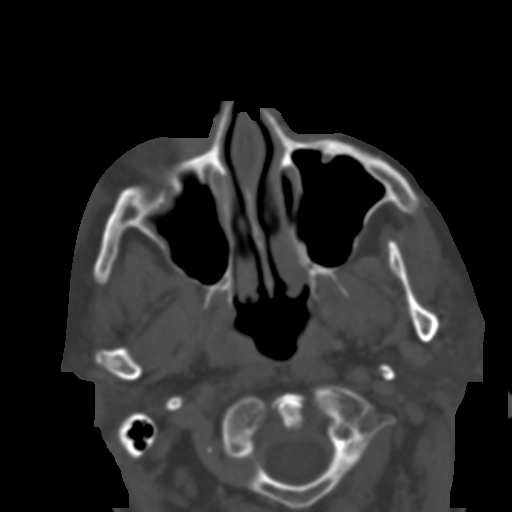
[im 29/49  bone]
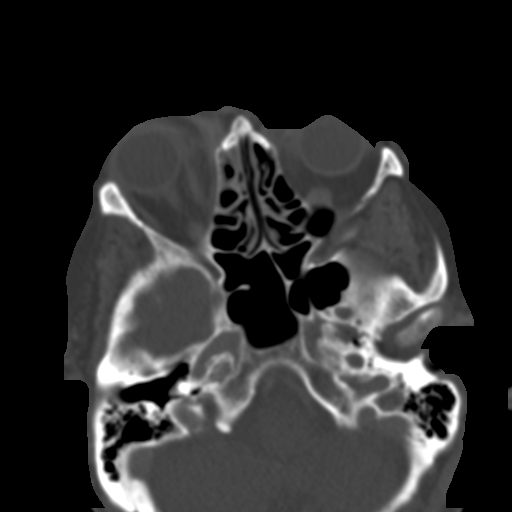
[im 37/49  brain]
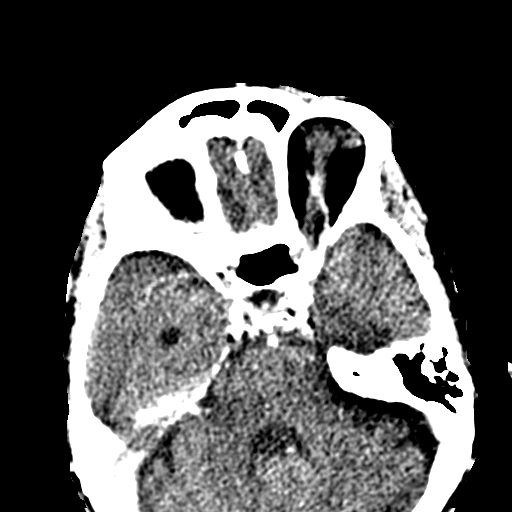
[im 37/49  bone]
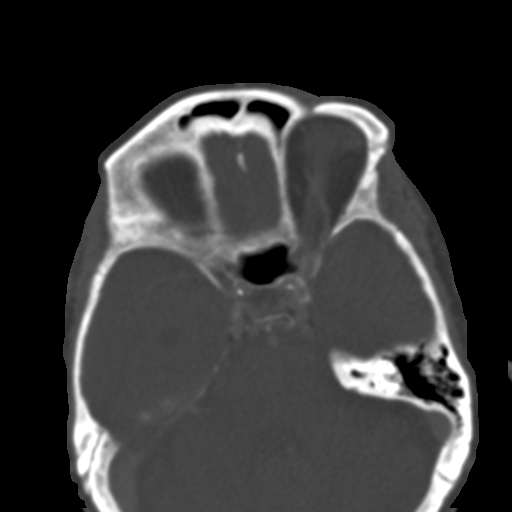
[im 44/49  bone]
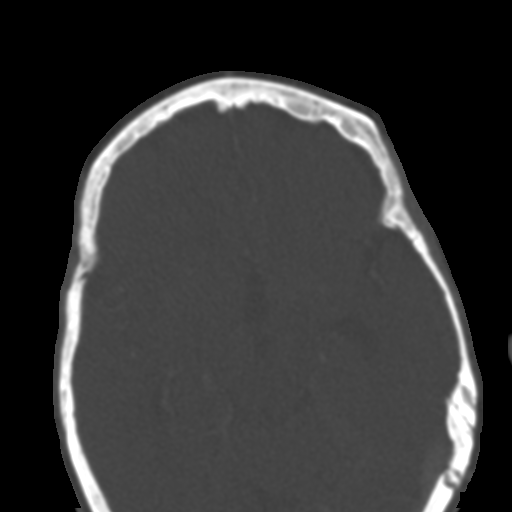

[Series 7: orbits 2.0 coronal · coronal · 0.18mm/px · 3 of 100 slices shown]
[im 34/100  bone]
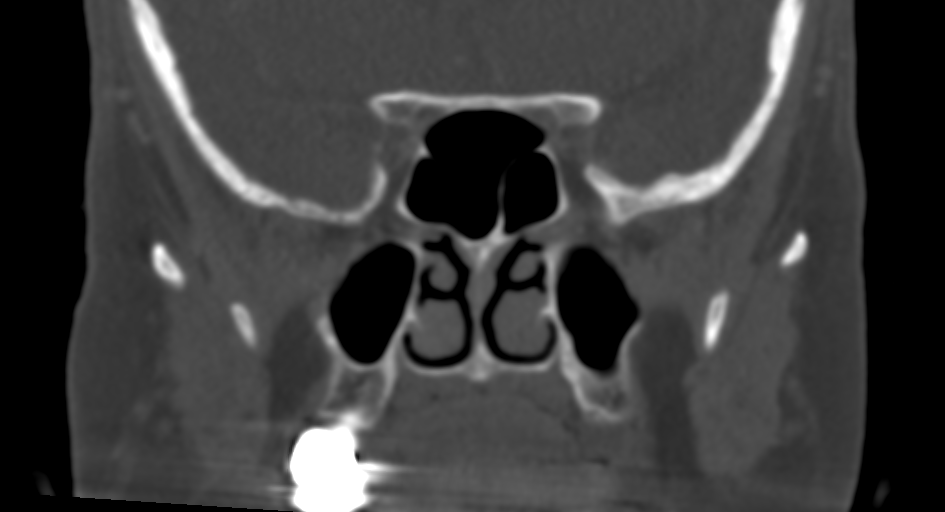
[im 45/100  bone]
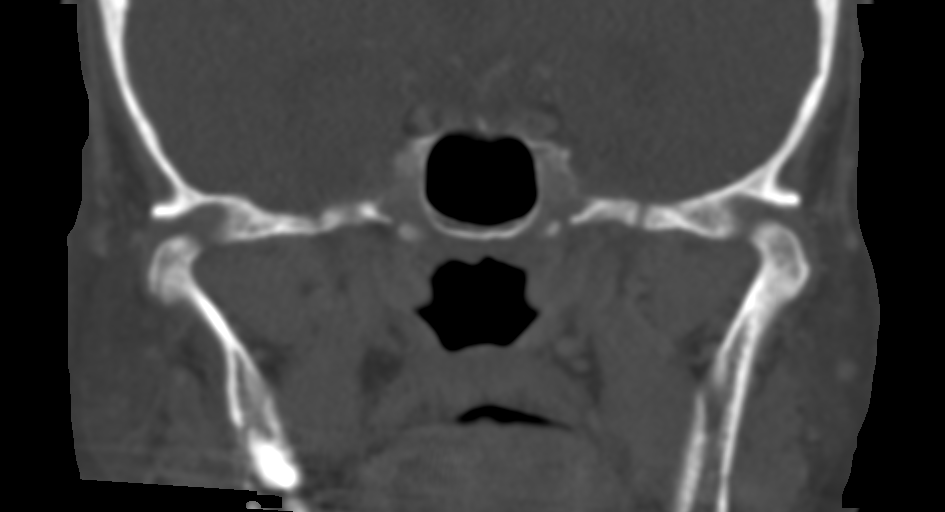
[im 56/100  bone]
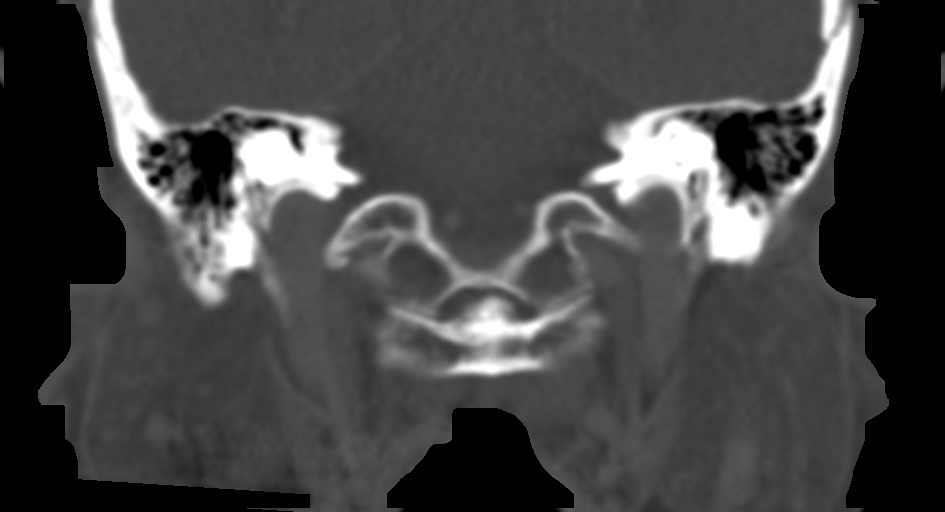

[Series 9: orbits 2.0 sagittal · sagittal · 0.18mm/px · 3 of 85 slices shown]
[im 29/85  bone]
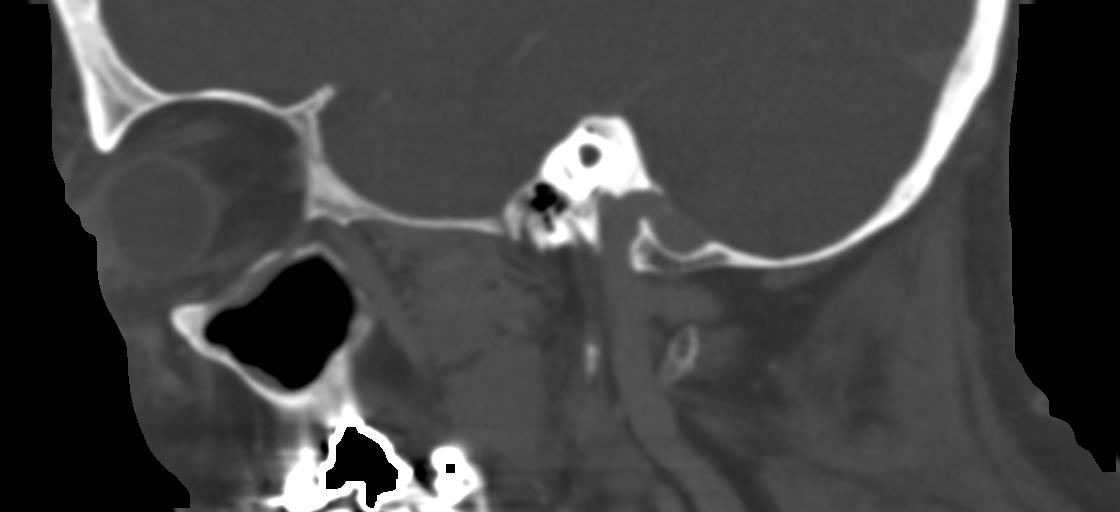
[im 43/85  bone]
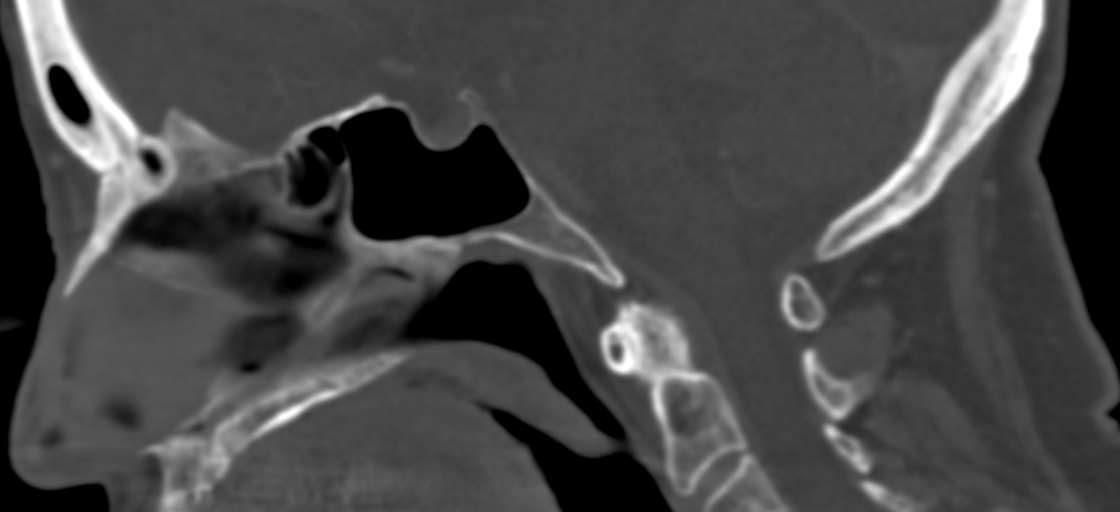
[im 57/85  bone]
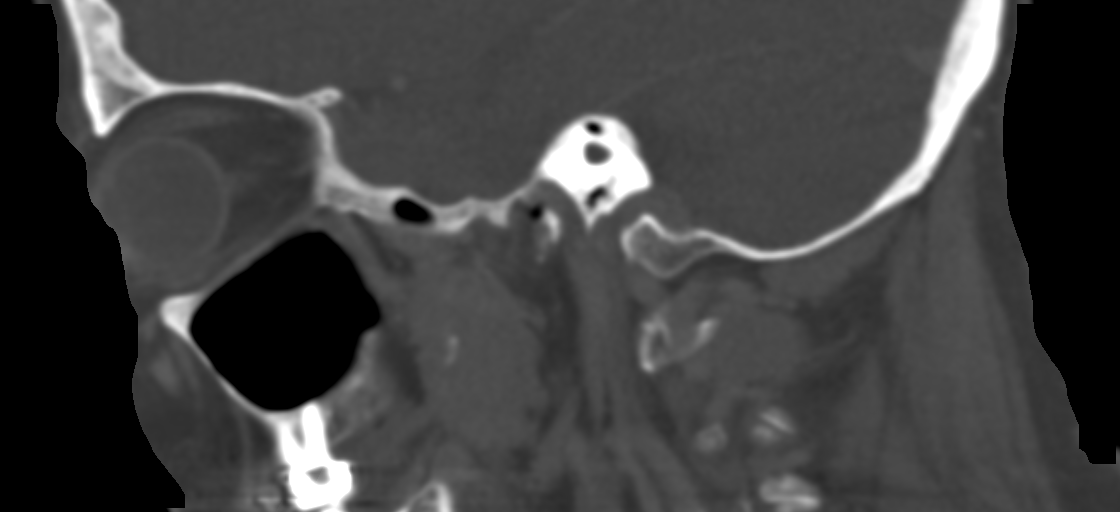

[12 of 47 positions shown; findings below may reference images not displayed]

FINDINGS: Orbits: Rim enhancing fluid collection right medial orbit measures
11 x 17 mm and is essentially unchanged in size. This extends into
the nasal lacrimal duct and most likely is a dacryocystocele. The
enhancement is suggestive of super infection. Interval improvement
in retro bulbar edema in the orbital fat on the right. Decreased
right proptosis since the prior study.

Left orbit negative.  Bilateral cataract surgery.

Visualized sinuses: Mild mucosal edema right ethmoid and maxillary
sinus appears unchanged. No air-fluid levels.

Soft tissues: Mild soft tissue swelling overlying the left orbit
similar to the prior study.

Limited intracranial: Generalized atrophy without acute abnormality.
IMPRESSION: Persistent rim enhancing fluid collection right medial orbit similar
in size to the prior study. This is most likely infected
dacrocystocele given the enhancement. Decreased enhancement within
the right retro bulbar fat with decreased proptosis on the right
compared with the prior study. Soft tissue swelling overlying the
right orbital preseptal soft tissues is unchanged and could be
chronic
